# Patient Record
Sex: Male | Born: 1950 | Race: White | Marital: Married | Smoking: Never smoker
Health system: Southern US, Community
[De-identification: ages and names within clinical notes are randomized; demographics above are authoritative.]

## PROBLEM LIST (undated history)

## (undated) DIAGNOSIS — K8020 Calculus of gallbladder without cholecystitis without obstruction: Secondary | ICD-10-CM

## (undated) DIAGNOSIS — G2581 Restless legs syndrome: Secondary | ICD-10-CM

## (undated) DIAGNOSIS — I219 Acute myocardial infarction, unspecified: Secondary | ICD-10-CM

## (undated) DIAGNOSIS — I10 Essential (primary) hypertension: Secondary | ICD-10-CM

## (undated) DIAGNOSIS — E119 Type 2 diabetes mellitus without complications: Secondary | ICD-10-CM

## (undated) DIAGNOSIS — D509 Iron deficiency anemia, unspecified: Secondary | ICD-10-CM

## (undated) DIAGNOSIS — I2510 Atherosclerotic heart disease of native coronary artery without angina pectoris: Secondary | ICD-10-CM

## (undated) DIAGNOSIS — G4730 Sleep apnea, unspecified: Secondary | ICD-10-CM

## (undated) DIAGNOSIS — I471 Supraventricular tachycardia, unspecified: Secondary | ICD-10-CM

## (undated) DIAGNOSIS — K5792 Diverticulitis of intestine, part unspecified, without perforation or abscess without bleeding: Secondary | ICD-10-CM

## (undated) HISTORY — PX: CERVICAL FUSION: SHX112

## (undated) HISTORY — DX: Supraventricular tachycardia: I47.1

## (undated) HISTORY — DX: Restless legs syndrome: G25.81

## (undated) HISTORY — PX: OTHER SURGICAL HISTORY: SHX169

## (undated) HISTORY — DX: Sleep apnea, unspecified: G47.30

## (undated) HISTORY — DX: Iron deficiency anemia, unspecified: D50.9

## (undated) HISTORY — DX: Supraventricular tachycardia, unspecified: I47.10

---

## 2016-11-27 DIAGNOSIS — E782 Mixed hyperlipidemia: Secondary | ICD-10-CM | POA: Diagnosis not present

## 2016-11-27 DIAGNOSIS — I10 Essential (primary) hypertension: Secondary | ICD-10-CM | POA: Diagnosis not present

## 2016-11-27 DIAGNOSIS — I2510 Atherosclerotic heart disease of native coronary artery without angina pectoris: Secondary | ICD-10-CM | POA: Diagnosis not present

## 2016-11-27 DIAGNOSIS — E119 Type 2 diabetes mellitus without complications: Secondary | ICD-10-CM | POA: Diagnosis not present

## 2016-11-27 DIAGNOSIS — E1165 Type 2 diabetes mellitus with hyperglycemia: Secondary | ICD-10-CM | POA: Diagnosis not present

## 2016-12-17 DIAGNOSIS — R079 Chest pain, unspecified: Secondary | ICD-10-CM | POA: Diagnosis not present

## 2016-12-17 DIAGNOSIS — E782 Mixed hyperlipidemia: Secondary | ICD-10-CM | POA: Diagnosis not present

## 2016-12-17 DIAGNOSIS — I10 Essential (primary) hypertension: Secondary | ICD-10-CM | POA: Diagnosis not present

## 2016-12-17 DIAGNOSIS — E1165 Type 2 diabetes mellitus with hyperglycemia: Secondary | ICD-10-CM | POA: Diagnosis not present

## 2016-12-31 ENCOUNTER — Emergency Department (HOSPITAL_COMMUNITY): Payer: Medicare Other

## 2016-12-31 ENCOUNTER — Emergency Department (HOSPITAL_COMMUNITY)
Admission: EM | Admit: 2016-12-31 | Discharge: 2016-12-31 | Disposition: A | Discharge status: Home / Self Care | Payer: Medicare Other | Attending: Emergency Medicine | Admitting: Emergency Medicine

## 2016-12-31 ENCOUNTER — Encounter (HOSPITAL_COMMUNITY): Payer: Self-pay | Admitting: Emergency Medicine

## 2016-12-31 DIAGNOSIS — Z7984 Long term (current) use of oral hypoglycemic drugs: Secondary | ICD-10-CM | POA: Insufficient documentation

## 2016-12-31 DIAGNOSIS — E860 Dehydration: Secondary | ICD-10-CM | POA: Insufficient documentation

## 2016-12-31 DIAGNOSIS — R1084 Generalized abdominal pain: Secondary | ICD-10-CM | POA: Diagnosis not present

## 2016-12-31 DIAGNOSIS — I10 Essential (primary) hypertension: Secondary | ICD-10-CM | POA: Diagnosis not present

## 2016-12-31 DIAGNOSIS — E119 Type 2 diabetes mellitus without complications: Secondary | ICD-10-CM | POA: Insufficient documentation

## 2016-12-31 DIAGNOSIS — R1110 Vomiting, unspecified: Secondary | ICD-10-CM | POA: Diagnosis not present

## 2016-12-31 DIAGNOSIS — R112 Nausea with vomiting, unspecified: Secondary | ICD-10-CM

## 2016-12-31 DIAGNOSIS — E876 Hypokalemia: Secondary | ICD-10-CM | POA: Diagnosis not present

## 2016-12-31 DIAGNOSIS — R109 Unspecified abdominal pain: Secondary | ICD-10-CM | POA: Diagnosis not present

## 2016-12-31 HISTORY — DX: Essential (primary) hypertension: I10

## 2016-12-31 HISTORY — DX: Type 2 diabetes mellitus without complications: E11.9

## 2016-12-31 HISTORY — DX: Diverticulitis of intestine, part unspecified, without perforation or abscess without bleeding: K57.92

## 2016-12-31 LAB — URINALYSIS, ROUTINE W REFLEX MICROSCOPIC
BILIRUBIN URINE: NEGATIVE
KETONES UR: 80 mg/dL — AB
LEUKOCYTES UA: NEGATIVE
Nitrite: NEGATIVE
PH: 5 (ref 5.0–8.0)
PROTEIN: 100 mg/dL — AB
Specific Gravity, Urine: 1.025 (ref 1.005–1.030)

## 2016-12-31 LAB — COMPREHENSIVE METABOLIC PANEL
ALT: 37 U/L (ref 17–63)
AST: 26 U/L (ref 15–41)
Albumin: 4.9 g/dL (ref 3.5–5.0)
Alkaline Phosphatase: 59 U/L (ref 38–126)
Anion gap: 17 — ABNORMAL HIGH (ref 5–15)
BUN: 34 mg/dL — AB (ref 6–20)
CHLORIDE: 91 mmol/L — AB (ref 101–111)
CO2: 24 mmol/L (ref 22–32)
Calcium: 9.6 mg/dL (ref 8.9–10.3)
Creatinine, Ser: 1.08 mg/dL (ref 0.61–1.24)
Glucose, Bld: 250 mg/dL — ABNORMAL HIGH (ref 65–99)
POTASSIUM: 2.8 mmol/L — AB (ref 3.5–5.1)
SODIUM: 132 mmol/L — AB (ref 135–145)
Total Bilirubin: 1.3 mg/dL — ABNORMAL HIGH (ref 0.3–1.2)
Total Protein: 8.3 g/dL — ABNORMAL HIGH (ref 6.5–8.1)

## 2016-12-31 LAB — CBC WITH DIFFERENTIAL/PLATELET
BASOS ABS: 0 10*3/uL (ref 0.0–0.1)
Basophils Relative: 0 %
EOS ABS: 0 10*3/uL (ref 0.0–0.7)
EOS PCT: 0 %
HCT: 51.4 % (ref 39.0–52.0)
Hemoglobin: 18.5 g/dL — ABNORMAL HIGH (ref 13.0–17.0)
LYMPHS PCT: 9 %
Lymphs Abs: 1.2 10*3/uL (ref 0.7–4.0)
MCH: 29.6 pg (ref 26.0–34.0)
MCHC: 36 g/dL (ref 30.0–36.0)
MCV: 82.4 fL (ref 78.0–100.0)
MONO ABS: 0.8 10*3/uL (ref 0.1–1.0)
Monocytes Relative: 6 %
Neutro Abs: 11.5 10*3/uL — ABNORMAL HIGH (ref 1.7–7.7)
Neutrophils Relative %: 85 %
PLATELETS: 247 10*3/uL (ref 150–400)
RBC: 6.24 MIL/uL — AB (ref 4.22–5.81)
RDW: 13.5 % (ref 11.5–15.5)
WBC: 13.5 10*3/uL — AB (ref 4.0–10.5)

## 2016-12-31 LAB — LIPASE, BLOOD: LIPASE: 25 U/L (ref 11–51)

## 2016-12-31 MED ORDER — MORPHINE SULFATE (PF) 4 MG/ML IV SOLN
4.0000 mg | Freq: Once | INTRAVENOUS | Status: AC
  Administered 2016-12-31: 4 mg via INTRAVENOUS
  Filled 2016-12-31: qty 1

## 2016-12-31 MED ORDER — ONDANSETRON HCL 4 MG PO TABS: 4.0000 mg | ORAL_TABLET | Freq: Four times a day (QID) | ORAL | 0 refills | Status: DC | PRN

## 2016-12-31 MED ORDER — ONDANSETRON HCL 4 MG/2ML IJ SOLN
4.0000 mg | Freq: Once | INTRAMUSCULAR | Status: AC
  Administered 2016-12-31: 4 mg via INTRAVENOUS
  Filled 2016-12-31: qty 2

## 2016-12-31 MED ORDER — POTASSIUM CHLORIDE CRYS ER 20 MEQ PO TBCR: 40.0000 meq | EXTENDED_RELEASE_TABLET | Freq: Every day | ORAL | 0 refills | Status: DC

## 2016-12-31 MED ORDER — SODIUM CHLORIDE 0.9 % IV BOLUS (SEPSIS)
1000.0000 mL | Freq: Once | INTRAVENOUS | Status: AC
  Administered 2016-12-31: 1000 mL via INTRAVENOUS

## 2016-12-31 MED ORDER — HYDROCODONE-ACETAMINOPHEN 5-325 MG PO TABS: 1.0000 | ORAL_TABLET | ORAL | 0 refills | Status: DC | PRN

## 2016-12-31 MED ORDER — ONDANSETRON HCL 4 MG/2ML IJ SOLN
INTRAMUSCULAR | Status: AC
  Filled 2016-12-31: qty 2

## 2016-12-31 MED ORDER — POTASSIUM CHLORIDE 10 MEQ/100ML IV SOLN
10.0000 meq | INTRAVENOUS | Status: AC
Start: 2016-12-31 — End: 2016-12-31
  Administered 2016-12-31 (×2): 10 meq via INTRAVENOUS
  Filled 2016-12-31 (×2): qty 100

## 2016-12-31 MED ORDER — ONDANSETRON HCL 4 MG/2ML IJ SOLN
4.0000 mg | Freq: Once | INTRAMUSCULAR | Status: AC
  Administered 2016-12-31: 4 mg via INTRAVENOUS

## 2016-12-31 MED ORDER — IOPAMIDOL (ISOVUE-300) INJECTION 61%
100.0000 mL | Freq: Once | INTRAVENOUS | Status: AC | PRN
  Administered 2016-12-31: 100 mL via INTRAVENOUS

## 2016-12-31 NOTE — ED Triage Notes (Signed)
Pt reports severe mid abdominal pain with nausea vomiting x 3 days.

## 2016-12-31 NOTE — ED Provider Notes (Signed)
[facility] DEPT Provider Note   CSN: [id number] Arrival date & time: 12/31/16  1502     History   Chief Complaint Chief Complaint  Patient presents with  . Abdominal Pain    HPI Russell Morrow is a 66 y.o. male.  HPI Patient presents with several days of abdominal pain. States the pain is been migratory. Associated with nausea and vomiting. States he had a bowel movement after taking a Dulcolax suppository. No blood in the stool. No fever or chills. Abdominal pain worsened today and is now mostly in his left lower quadrant. Denies flank pain, hematuria, frequency or dysuria. Has history of diverticulitis. States he did not take his blood pressure medications this morning. Past Medical History:  Diagnosis Date  . Diabetes mellitus without complication (HCC)   . Diverticulitis   . Hypertension   . Myocardial infarct (HCC)     There are no active problems to display for this patient.   Past Surgical History:  Procedure Laterality Date  . CERVICAL FUSION    . nasal septum repair         Home Medications    Prior to Admission medications   Medication Sig Start Date End Date Taking? Authorizing Provider  acetaminophen (TYLENOL) 325 MG tablet Take 650 mg by mouth every 6 (six) hours as needed for mild pain or moderate pain.   Yes [provider]  clonazePAM (KLONOPIN) 2 MG tablet Take 2 mg by mouth at bedtime.   Yes [provider]  cloNIDine (CATAPRES) 0.2 MG tablet Take 0.2 mg by mouth 2 (two) times daily.   Yes [provider]  diphenhydrAMINE (BENADRYL) 25 mg capsule Take 25 mg by mouth at bedtime.   Yes [provider]  esomeprazole (NEXIUM) 40 MG capsule Take 40 mg by mouth daily.   Yes [provider]  hydrochlorothiazide (HYDRODIURIL) 25 MG tablet Take 25 mg by mouth daily.   Yes [provider]  insulin glargine (LANTUS) 100 UNIT/ML injection Inject 25 Units into the skin at bedtime.   Yes [provider]  irbesartan (AVAPRO) 300 MG tablet Take 300 mg by mouth daily.   Yes [provider]  Liraglutide (VICTOZA [place]) Inject 1.2 mLs into the skin at bedtime.   Yes [provider]  metFORMIN (GLUCOPHAGE) 1000 MG tablet Take 1,000 mg by mouth 2 (two) times daily with a meal.   Yes [provider]  HYDROcodone-acetaminophen (NORCO) 5-325 MG tablet Take 1-2 tablets by mouth every 4 (four) hours as needed for severe pain. 12/31/16   Russell Racer, MD  ondansetron (ZOFRAN) 4 MG tablet Take 1 tablet (4 mg total) by mouth every 6 (six) hours as needed for nausea or vomiting. 12/31/16   Russell Racer, MD  potassium chloride SA (K-DUR,KLOR-CON) 20 MEQ tablet Take 2 tablets (40 mEq total) by mouth daily. 12/31/16   Russell Racer, MD    Family History History reviewed. No pertinent family history.  Social History Social History  Substance Use Topics  . Smoking status: Never Smoker  . Smokeless tobacco: Never Used  . Alcohol use 1.2 oz/week    2 Shots of liquor per week     Comment: evenings     Allergies   Statins   Review of Systems Review of Systems  Constitutional: Negative for chills, fatigue and fever.  Respiratory: Negative for cough and shortness of breath.   Cardiovascular: Negative for chest pain.  Gastrointestinal: Positive for abdominal pain, constipation, nausea and vomiting. Negative  for blood in stool and diarrhea.  Genitourinary: Negative for dysuria, flank pain, frequency and hematuria.  Musculoskeletal: Negative for back pain, myalgias, neck pain and neck stiffness.  Skin: Negative for rash and wound.  Neurological: Negative for dizziness, weakness, light-headedness, numbness and headaches.  All other systems reviewed and are negative.    Physical Exam Updated Vital Signs BP (!) 176/77   Pulse 94   Temp 98.8 F (37.1 C) (Oral)   Resp 14   Ht 6\' 2"  (1.88 m)   Wt 106.6 kg (235 lb)   SpO2 96%   BMI 30.17 kg/m   Physical Exam    Constitutional: He is oriented to person, place, and time. He appears well-developed and well-nourished. No distress.  HENT:  Head: Normocephalic and atraumatic.  Mouth/Throat: Oropharynx is clear and moist. No oropharyngeal exudate.  Eyes: Pupils are equal, round, and reactive to light. EOM are normal.  Neck: Normal range of motion. Neck supple.  Cardiovascular: Regular rhythm.  Exam reveals no gallop and no friction rub.   No murmur heard. Tachycardia  Pulmonary/Chest: Effort normal and breath sounds normal. No respiratory distress. He has no wheezes. He has no rales. He exhibits no tenderness.  Abdominal: Soft. Bowel sounds are normal. There is tenderness. There is no rebound and no guarding.  Mild left lower quadrant tenderness to palpation without rebound or guarding.  Musculoskeletal: Normal range of motion. He exhibits no edema or tenderness.  No CVA tenderness. Distal pulses are 2+.  Neurological: He is alert and oriented to person, place, and time.  Moves all extremities without focal deficit. Sensation fully intact.  Skin: Skin is warm and dry. Capillary refill takes less than 2 seconds. No rash noted. No erythema.  Psychiatric: He has a normal mood and affect. His behavior is normal.  Nursing note and vitals reviewed.    ED Treatments / Results  Labs (all labs ordered are listed, but only abnormal results are displayed) Labs Reviewed  CBC WITH DIFFERENTIAL/PLATELET - Abnormal; Notable for the following:       Result Value   WBC 13.5 (*)    RBC 6.24 (*)    Hemoglobin 18.5 (*)    Neutro Abs 11.5 (*)    All other components within normal limits  COMPREHENSIVE METABOLIC PANEL - Abnormal; Notable for the following:    Sodium 132 (*)    Potassium 2.8 (*)    Chloride 91 (*)    Glucose, Bld 250 (*)    BUN 34 (*)    Total Protein 8.3 (*)    Total Bilirubin 1.3 (*)    Anion gap 17 (*)    All other components within normal limits  URINALYSIS, ROUTINE W REFLEX MICROSCOPIC  - Abnormal; Notable for the following:    Glucose, UA >=500 (*)    Hgb urine dipstick MODERATE (*)    Ketones, ur 80 (*)    Protein, ur 100 (*)    Bacteria, UA RARE (*)    Squamous Epithelial / LPF 0-5 (*)    All other components within normal limits  LIPASE, BLOOD    EKG  EKG Interpretation  Date/Time:  Wednesday December 31 2016 15:50:01 EDT Ventricular Rate:  108 PR Interval:    QRS Duration: 84 QT Interval:  320 QTC Calculation: 429 R Axis:   -102 Text Interpretation:  Sinus tachycardia LAE, consider biatrial enlargement LAD, consider left anterior fascicular block Right ventricular hypertrophy No old tracing to compare Confirmed by Linwood DibblesKnapp, Jon [phone]) on 01/01/2017 2:33:25  PM       Radiology Ct Abdomen Pelvis W Contrast  Result Date: 12/31/2016 CLINICAL DATA:  Severe mid abdominal pain with nausea and vomiting for 3 days EXAM: CT ABDOMEN AND PELVIS WITH CONTRAST TECHNIQUE: Multidetector CT imaging of the abdomen and pelvis was performed using the standard protocol following bolus administration of intravenous contrast. CONTRAST:  100mL ISOVUE-300 IOPAMIDOL (ISOVUE-300) INJECTION 61% COMPARISON:  None. FINDINGS: Lower chest: Lung bases demonstrate linear atelectasis or scar at the right base. No pleural effusion or focal consolidation is seen. The heart is nonenlarged. Minimal coronary artery calcification. Hepatobiliary: Hepatic steatosis. Small calcified gallstone. No biliary dilatation Pancreas: Unremarkable. No pancreatic ductal dilatation or surrounding inflammatory changes. Spleen: Normal in size without focal abnormality. Adrenals/Urinary Tract: Adrenal glands are within normal limits. Nonspecific perinephric fat stranding. No hydronephrosis. 13 mm cystic density in the mid to lower right kidney with possible increased peripheral attenuation. Normal bladder Stomach/Bowel: Stomach is within normal limits. Appendix appears normal. No evidence of bowel wall thickening, distention, or  inflammatory changes. Mild sigmoid colon diverticular disease without acute inflammation Vascular/Lymphatic: Aortic atherosclerosis. No enlarged abdominal or pelvic lymph nodes. Reproductive: Prostate is unremarkable. Other: Small in the left inguinal canal.  No free air or free fluid. Musculoskeletal: Degenerative changes. No acute or suspicious bone lesion. IMPRESSION: 1. Negative for acute intra-abdominal or pelvic pathology 2. Gallstone 3. 13 mm cystic lesion mid to lower right kidney with possible increased peripheral density or peripheral enhancement. Suggest nonemergent follow-up renal CT or MRI. 4. Sigmoid colon diverticular disease without acute inflammation Electronically Signed   By: Jasmine PangKim  Fujinaga M.D.   On: 12/31/2016 17:25    Procedures Procedures (including critical care time)  Medications Ordered in ED Medications  sodium chloride 0.9 % bolus 1,000 mL (0 mLs Intravenous Stopped 12/31/16 1817)  morphine 4 MG/ML injection 4 mg (4 mg Intravenous Given 12/31/16 1555)  ondansetron (ZOFRAN) injection 4 mg (4 mg Intravenous Given 12/31/16 1555)  potassium chloride 10 mEq in 100 mL IVPB (0 mEq Intravenous Stopped 12/31/16 1839)  iopamidol (ISOVUE-300) 61 % injection 100 mL (100 mLs Intravenous Contrast Given 12/31/16 1711)  morphine 4 MG/ML injection 4 mg (4 mg Intravenous Given 12/31/16 1729)  sodium chloride 0.9 % bolus 1,000 mL (0 mLs Intravenous Stopped 12/31/16 1945)  ondansetron (ZOFRAN) injection 4 mg (4 mg Intravenous Given 12/31/16 1846)     Initial Impression / Assessment and Plan / ED Course  I have reviewed the triage vital signs and the nursing notes.  Pertinent labs & imaging results that were available during my care of the patient were reviewed by me and considered in my medical decision making (see chart for details).     No intra-abdominal findings on CT. Given potassium replacement and IV fluids in the emergency department. Patient states he is feeling much better.  Abdominal exam is benign. He's been given return precautions and has voiced understanding.  Final Clinical Impressions(s) / ED Diagnoses   Final diagnoses:  Generalized abdominal pain  Non-intractable vomiting with nausea, unspecified vomiting type  Dehydration  Hypokalemia    New Prescriptions Discharge Medication List as of 12/31/2016  7:57 PM    START taking these medications   Details  HYDROcodone-acetaminophen (NORCO) 5-325 MG tablet Take 1-2 tablets by mouth every 4 (four) hours as needed for severe pain., Starting Wed 12/31/2016, Print    ondansetron (ZOFRAN) 4 MG tablet Take 1 tablet (4 mg total) by mouth every 6 (six) hours as needed for nausea or vomiting.,  Starting Wed 12/31/2016, Print         Russell Racer, MD 01/01/17 [phone]

## 2017-01-09 NOTE — Progress Notes (Signed)
Cardiology Office Note   Date:  01/13/2017   ID:  Russell Morrow, DOB 11-12-1950, MRN [id number]  PCP:  Celene Squibb, MD  Cardiologist:   Jenkins Rouge, MD   No chief complaint on file.     History of Present Illness: Russell Morrow is a 66 y.o. male who presents for consultation regarding chest pain Referred by Dr Nevada Crane CRFls include DM And HTN.  Despite DM currently not on statin History of elevated LFTls  ? Fatty liver due to ETOH Indicates cath in 2010 with "mild" CAD and mildly dilated aortic root. Family history positive for CAD Brother has a stent.   Labs 06/12/16 LDL-P 1210  HDL 35 LDL -C 121  A1c 9.5   Echo 11/22/15 : done [place] Normal EF moderate LVH 16 mm  grade one diastolic Mild LAE MAC Aorta 3.9 cm  Myovue :  11/22/15 Moderate fixed inferior wall defect EF 45% no ischemic ECG changes Ex 6:39 minutes  CT: calcium score 81 46th percentile  nonobstructive disease in LAD/Circumflex  Aorta 4.0 cm   Seen in ER 12/31/16 for abdominal pain and low K Hydrated CT no pathology Discharged Did mention 13 mm cystic lesion in Right lower Kidney needs f/u CT/MRI  He is a retired Insurance account manager. Got divorced a year ago and moved to [place] to be near grand kids But now back here with girlfriend helping to care for her mother. He has not taken good care of himself the Last year and BS poorly controlled Still walks/hikes a lot with no chest pain. Chronic PACls with occasional palpitations  Has wine most nights and smokes mariajuana daily   Past Medical History:  Diagnosis Date  . Diabetes mellitus without complication ([place])   . Diverticulitis   . Hypertension   . Myocardial infarct [facility])     Past Surgical History:  Procedure Laterality Date  . CERVICAL FUSION    . nasal septum repair       Current Outpatient Prescriptions  Medication Sig Dispense Refill  . acetaminophen (TYLENOL) 325 MG tablet Take 650 mg by mouth every 6 (six) hours as needed for mild pain or moderate pain.     Marland Kitchen amLODipine (NORVASC) 5 MG tablet Take 5 mg by mouth daily.    . clonazePAM (KLONOPIN) 2 MG tablet Take 2 mg by mouth at bedtime.    . cloNIDine (CATAPRES) 0.2 MG tablet Take 0.2 mg by mouth 2 (two) times daily.    . diphenhydrAMINE (BENADRYL) 25 mg capsule Take 25 mg by mouth at bedtime.    Marland Kitchen esomeprazole (NEXIUM) 40 MG capsule Take 40 mg by mouth daily.    . hydrochlorothiazide (HYDRODIURIL) 25 MG tablet Take 25 mg by mouth daily.    Marland Kitchen HYDROcodone-acetaminophen (NORCO) 5-325 MG tablet Take 1-2 tablets by mouth every 4 (four) hours as needed for severe pain. 10 tablet 0  . insulin glargine (LANTUS) 100 UNIT/ML injection Inject 25 Units into the skin at bedtime.    . irbesartan (AVAPRO) 300 MG tablet Take 300 mg by mouth daily.    . Liraglutide (VICTOZA [place]) Inject 1.2 mLs into the skin at bedtime.    . metFORMIN (GLUCOPHAGE) 1000 MG tablet Take 1,000 mg by mouth 2 (two) times daily with a meal.    . ondansetron (ZOFRAN) 4 MG tablet Take 1 tablet (4 mg total) by mouth every 6 (six) hours as needed for nausea or vomiting. 12 tablet 0  . potassium chloride SA (  K-DUR,KLOR-CON) 20 MEQ tablet Take 2 tablets (40 mEq total) by mouth daily. 5 tablet 0  . ezetimibe (ZETIA) 10 MG tablet Take 1 tablet (10 mg total) by mouth daily. 30 tablet 6   No current facility-administered medications for this visit.     Allergies:   Statins    Social History:  The patient  reports that he has never smoked. He has never used smokeless tobacco. He reports that he drinks about 1.2 oz of alcohol per week . He reports that he uses drugs, including Marijuana.   Family History:  The patient's family history includes Cancer in his mother; Diabetes in his brother and father; Heart disease in his brother and father; Hypertension in his brother, father, and mother.    ROS:  Please see the history of present illness.   Otherwise, review of systems are positive for none.   All other systems are reviewed and negative.     PHYSICAL EXAM: VS:  BP (!) 184/86 (BP Location: Right Arm, Cuff Size: Large)   Pulse 84   Ht _0  (1.88 m)   Wt 240 lb 6.4 oz (109 kg)   SpO2 97%   BMI 30.87 kg/m  , BMI Body mass index is 30.87 kg/m. Affect appropriate Healthy:  appears stated age 19: normal Neck supple with no adenopathy JVP normal no bruits no thyromegaly Lungs clear with no wheezing and good diaphragmatic motion Heart:  S1/S2 no murmur, no rub, gallop or click PMI normal Abdomen: benighn, BS positve, no tenderness, no AAA no bruit.  No HSM or HJR Distal pulses intact with no bruits No edema Neuro non-focal Skin warm and dry No muscular weakness    EKG:  01/01/17 SR [id number] no previous MI 01/10/16 NSR rate 72 PAC nonspecific lateral T wave changes    Recent Labs: 12/31/2016: ALT 37; BUN 34; Creatinine, Ser 1.08; Hemoglobin 18.5; Platelets 247; Potassium 2.8; Sodium 132    Lipid Panel No results found for: CHOL, TRIG, HDL, CHOLHDL, VLDL, LDLCALC, LDLDIRECT    Wt Readings from Last 3 Encounters:  01/13/17 240 lb 6.4 oz (109 kg)  12/31/16 235 lb (106.6 kg)      Other studies Reviewed: Additional studies/ records that were reviewed today include: Notes primary [facility] Notes from [facility] including Primary care notes cardiac CT, echo and myovue done June 2017 .    ASSESSMENT AND PLAN:  1.  CAD non obstructive by CT 11/2015 false positive myovue continue risk factor modification  2. DM needs to see eye doctor f/u primary poor control  3. Cholesterol intolerant to multiple statins with elevated LFTls start zetia 10 mg labs in 3 months  4. Aortic Root Dilatation mild 3.9-4.0 cm no need for f/u at this time  5. HTN: says he's compliant f/u primary consider changing norvasc to beta blocker he has been on them in  Past and doesn't like them but has chronic PAC;s and tends to be tachycardiac with his DM.    Current medicines are reviewed at length with the patient today.  The patient does not  have concerns regarding medicines.  The following changes have been made:  Zetia 10 mg   Labs/ tests ordered today include: Lipid / Liver 3 months   Orders Placed This Encounter  Procedures  . Hepatic function panel  . Lipid Profile  . EKG 12-Lead     Disposition:   FU with me in 6 months      Signed, Jenkins Rouge, MD  01/13/2017 10:44 AM    [facility] [place], [place], [place]  [address] Phone: [phone]; Fax: [phone]

## 2017-01-12 ENCOUNTER — Encounter: Payer: Self-pay | Admitting: *Deleted

## 2017-01-13 ENCOUNTER — Encounter: Payer: Self-pay | Admitting: Cardiovascular Disease

## 2017-01-13 ENCOUNTER — Ambulatory Visit (INDEPENDENT_AMBULATORY_CARE_PROVIDER_SITE_OTHER): Payer: Medicare Other | Admitting: Cardiovascular Disease

## 2017-01-13 VITALS — BP 184/86 | HR 84 | Ht 74.0 in | Wt 240.4 lb

## 2017-01-13 DIAGNOSIS — E784 Other hyperlipidemia: Secondary | ICD-10-CM | POA: Diagnosis not present

## 2017-01-13 DIAGNOSIS — I10 Essential (primary) hypertension: Secondary | ICD-10-CM | POA: Diagnosis not present

## 2017-01-13 DIAGNOSIS — E7849 Other hyperlipidemia: Secondary | ICD-10-CM

## 2017-01-13 MED ORDER — EZETIMIBE 10 MG PO TABS: 10.0000 mg | ORAL_TABLET | Freq: Every day | ORAL | 6 refills | Status: DC

## 2017-01-13 NOTE — Patient Instructions (Signed)
Medication Instructions:  Your physician has recommended you make the following change in your medication:  Start Zetia 10 mg Daily    Labwork: Your physician recommends that you return for lab work in: 3 Months ( November 7)    Testing/Procedures: NONE  Follow-Up: Your physician wants you to follow-up in: 6 Months with Dr. Eden EmmsNishan.  You will receive a reminder letter in the mail two months in advance. If you don't receive a letter, please call our office to schedule the follow-up appointment.   Any Other Special Instructions Will Be Listed Below (If Applicable).     If you need a refill on your cardiac medications before your next appointment, please call your pharmacy.

## 2017-02-16 ENCOUNTER — Encounter (HOSPITAL_COMMUNITY): Payer: Self-pay | Admitting: *Deleted

## 2017-02-16 ENCOUNTER — Emergency Department (HOSPITAL_COMMUNITY)
Admission: EM | Admit: 2017-02-16 | Discharge: 2017-02-16 | Disposition: A | Discharge status: Home / Self Care | Payer: Medicare Other | Attending: Emergency Medicine | Admitting: Emergency Medicine

## 2017-02-16 ENCOUNTER — Emergency Department (HOSPITAL_COMMUNITY): Payer: Medicare Other

## 2017-02-16 DIAGNOSIS — I10 Essential (primary) hypertension: Secondary | ICD-10-CM | POA: Diagnosis not present

## 2017-02-16 DIAGNOSIS — I252 Old myocardial infarction: Secondary | ICD-10-CM | POA: Diagnosis not present

## 2017-02-16 DIAGNOSIS — Z794 Long term (current) use of insulin: Secondary | ICD-10-CM | POA: Insufficient documentation

## 2017-02-16 DIAGNOSIS — R1030 Lower abdominal pain, unspecified: Secondary | ICD-10-CM | POA: Diagnosis not present

## 2017-02-16 DIAGNOSIS — R109 Unspecified abdominal pain: Secondary | ICD-10-CM

## 2017-02-16 DIAGNOSIS — Z79899 Other long term (current) drug therapy: Secondary | ICD-10-CM | POA: Insufficient documentation

## 2017-02-16 DIAGNOSIS — E119 Type 2 diabetes mellitus without complications: Secondary | ICD-10-CM | POA: Diagnosis not present

## 2017-02-16 LAB — BASIC METABOLIC PANEL
Anion gap: 10 (ref 5–15)
BUN: 16 mg/dL (ref 6–20)
CALCIUM: 8.7 mg/dL — AB (ref 8.9–10.3)
CO2: 30 mmol/L (ref 22–32)
CREATININE: 0.7 mg/dL (ref 0.61–1.24)
Chloride: 98 mmol/L — ABNORMAL LOW (ref 101–111)
GFR calc non Af Amer: >60 mL/min (ref >60)
Glucose, Bld: 179 mg/dL — ABNORMAL HIGH (ref 65–99)
Potassium: 3.4 mmol/L — ABNORMAL LOW (ref 3.5–5.1)
SODIUM: 138 mmol/L (ref 135–145)

## 2017-02-16 LAB — LACTIC ACID, PLASMA
LACTIC ACID, VENOUS: 3.6 mmol/L — AB (ref 0.5–1.9)
Lactic Acid, Venous: 2.5 mmol/L (ref 0.5–1.9)

## 2017-02-16 LAB — COMPREHENSIVE METABOLIC PANEL
ALK PHOS: 54 U/L (ref 38–126)
ALT: 44 U/L (ref 17–63)
AST: 28 U/L (ref 15–41)
Albumin: 4.7 g/dL (ref 3.5–5.0)
Anion gap: 20 — ABNORMAL HIGH (ref 5–15)
BUN: 19 mg/dL (ref 6–20)
CALCIUM: 9.1 mg/dL (ref 8.9–10.3)
CO2: 22 mmol/L (ref 22–32)
CREATININE: 0.77 mg/dL (ref 0.61–1.24)
Chloride: 95 mmol/L — ABNORMAL LOW (ref 101–111)
Glucose, Bld: 242 mg/dL — ABNORMAL HIGH (ref 65–99)
Potassium: 3 mmol/L — ABNORMAL LOW (ref 3.5–5.1)
Sodium: 137 mmol/L (ref 135–145)
TOTAL PROTEIN: 7.7 g/dL (ref 6.5–8.1)
Total Bilirubin: 1.2 mg/dL (ref 0.3–1.2)

## 2017-02-16 LAB — CBC WITH DIFFERENTIAL/PLATELET
BASOS ABS: 0 10*3/uL (ref 0.0–0.1)
BASOS PCT: 0 %
EOS ABS: 0 10*3/uL (ref 0.0–0.7)
EOS PCT: 0 %
HCT: 46.1 % (ref 39.0–52.0)
Hemoglobin: 16.6 g/dL (ref 13.0–17.0)
Lymphocytes Relative: 8 %
Lymphs Abs: 0.7 10*3/uL (ref 0.7–4.0)
MCH: 30 pg (ref 26.0–34.0)
MCHC: 36 g/dL (ref 30.0–36.0)
MCV: 83.2 fL (ref 78.0–100.0)
Monocytes Absolute: 0.4 10*3/uL (ref 0.1–1.0)
Monocytes Relative: 5 %
Neutro Abs: 7.2 10*3/uL (ref 1.7–7.7)
Neutrophils Relative %: 87 %
PLATELETS: 215 10*3/uL (ref 150–400)
RBC: 5.54 MIL/uL (ref 4.22–5.81)
RDW: 13.6 % (ref 11.5–15.5)
WBC: 8.4 10*3/uL (ref 4.0–10.5)

## 2017-02-16 LAB — URINALYSIS, ROUTINE W REFLEX MICROSCOPIC
BILIRUBIN URINE: NEGATIVE
Bacteria, UA: NONE SEEN
Ketones, ur: 80 mg/dL — AB
Leukocytes, UA: NEGATIVE
NITRITE: NEGATIVE
Protein, ur: 100 mg/dL — AB
SPECIFIC GRAVITY, URINE: 1.025 (ref 1.005–1.030)
Squamous Epithelial / LPF: NONE SEEN
pH: 5 (ref 5.0–8.0)

## 2017-02-16 LAB — LIPASE, BLOOD: LIPASE: 30 U/L (ref 11–51)

## 2017-02-16 MED ORDER — IOPAMIDOL (ISOVUE-300) INJECTION 61%
100.0000 mL | Freq: Once | INTRAVENOUS | Status: AC | PRN
  Administered 2017-02-16: 100 mL via INTRAVENOUS

## 2017-02-16 MED ORDER — ONDANSETRON 4 MG PO TBDP: ORAL_TABLET | ORAL | 0 refills | Status: DC

## 2017-02-16 MED ORDER — HYDROCODONE-ACETAMINOPHEN 5-325 MG PO TABS: 1.0000 | ORAL_TABLET | Freq: Four times a day (QID) | ORAL | 0 refills | Status: DC | PRN

## 2017-02-16 MED ORDER — CLONIDINE HCL 0.2 MG PO TABS
0.2000 mg | ORAL_TABLET | Freq: Once | ORAL | Status: AC
  Administered 2017-02-16: 0.2 mg via ORAL
  Filled 2017-02-16: qty 1

## 2017-02-16 MED ORDER — MORPHINE SULFATE (PF) 4 MG/ML IV SOLN
4.0000 mg | INTRAVENOUS | Status: DC | PRN
  Administered 2017-02-16: 4 mg via INTRAVENOUS
  Filled 2017-02-16: qty 1

## 2017-02-16 MED ORDER — MORPHINE SULFATE (PF) 4 MG/ML IV SOLN
4.0000 mg | Freq: Once | INTRAVENOUS | Status: DC
  Filled 2017-02-16: qty 1

## 2017-02-16 MED ORDER — MORPHINE SULFATE (PF) 4 MG/ML IV SOLN
4.0000 mg | INTRAVENOUS | Status: AC | PRN
  Administered 2017-02-16 (×2): 4 mg via INTRAVENOUS
  Filled 2017-02-16: qty 1

## 2017-02-16 MED ORDER — ONDANSETRON HCL 4 MG/2ML IJ SOLN
4.0000 mg | INTRAMUSCULAR | Status: DC | PRN
  Administered 2017-02-16: 4 mg via INTRAVENOUS
  Filled 2017-02-16: qty 2

## 2017-02-16 MED ORDER — CIPROFLOXACIN HCL 500 MG PO TABS: 500.0000 mg | ORAL_TABLET | Freq: Two times a day (BID) | ORAL | 0 refills | Status: DC

## 2017-02-16 MED ORDER — SODIUM CHLORIDE 0.9 % IV BOLUS (SEPSIS)
1000.0000 mL | Freq: Once | INTRAVENOUS | Status: AC
  Administered 2017-02-16: 1000 mL via INTRAVENOUS

## 2017-02-16 MED ORDER — POTASSIUM CHLORIDE 10 MEQ/100ML IV SOLN
10.0000 meq | Freq: Once | INTRAVENOUS | Status: AC
  Administered 2017-02-16: 10 meq via INTRAVENOUS
  Filled 2017-02-16: qty 100

## 2017-02-16 NOTE — ED Provider Notes (Addendum)
[facility] DEPT Provider Note   CSN: [id number] Arrival date & time: 02/16/17  1254     History   Chief Complaint No chief complaint on file.   HPI RIO KIDANE is a 66 y.o. male.  HPI  Pt was seen at 1325.  Per pt, c/o gradual onset and persistence of constant lower abd "pain" since overnight last night.  Has been associated with multiple intermittent episodes of N/V.  Describes the abd pain as "pressure." Pt states he "thinks it's my diverticulitis again."  Pt did not take his usual meds this morning due to N/V.  Pt was evaluated in the ED 2 months ago for similar symptoms with reassuring workup. Denies diarrhea, no fevers, no back pain, no rash, no CP/SOB, no black or blood in stools or emesis.      Past Medical History:  Diagnosis Date  . Diabetes mellitus without complication (HCC)   . Diverticulitis   . Hypertension   . Myocardial infarct [facility])     Patient Active Problem List   Diagnosis Date Noted  . Hypertension     Past Surgical History:  Procedure Laterality Date  . CERVICAL FUSION    . nasal septum repair         Home Medications    Prior to Admission medications   Medication Sig Start Date End Date Taking? Authorizing Provider  acetaminophen (TYLENOL) 325 MG tablet Take 650 mg by mouth every 6 (six) hours as needed for mild pain or moderate pain.    [provider]  amLODipine (NORVASC) 5 MG tablet Take 5 mg by mouth daily.    [provider]  clonazePAM (KLONOPIN) 2 MG tablet Take 2 mg by mouth at bedtime.    [provider]  cloNIDine (CATAPRES) 0.2 MG tablet Take 0.2 mg by mouth 2 (two) times daily.    [provider]  diphenhydrAMINE (BENADRYL) 25 mg capsule Take 25 mg by mouth at bedtime.    [provider]  esomeprazole (NEXIUM) 40 MG capsule Take 40 mg by mouth daily.    [provider]  ezetimibe (ZETIA) 10 MG tablet Take 1 tablet (10 mg total) by mouth daily. 01/13/17   Wendall Stade, MD    hydrochlorothiazide (HYDRODIURIL) 25 MG tablet Take 25 mg by mouth daily.    [provider]  HYDROcodone-acetaminophen (NORCO) 5-325 MG tablet Take 1-2 tablets by mouth every 4 (four) hours as needed for severe pain. 12/31/16   [name], MD  insulin glargine (LANTUS) 100 UNIT/ML injection Inject 25 Units into the skin at bedtime.    [provider]  irbesartan (AVAPRO) 300 MG tablet Take 300 mg by mouth daily.    [provider]  Liraglutide (VICTOZA [place]) Inject 1.2 mLs into the skin at bedtime.    [provider]  metFORMIN (GLUCOPHAGE) 1000 MG tablet Take 1,000 mg by mouth 2 (two) times daily with a meal.    [provider]  ondansetron (ZOFRAN) 4 MG tablet Take 1 tablet (4 mg total) by mouth every 6 (six) hours as needed for nausea or vomiting. 12/31/16   [name], MD  potassium chloride SA (K-DUR,KLOR-CON) 20 MEQ tablet Take 2 tablets (40 mEq total) by mouth daily. 12/31/16   [name], MD    Family History Family History  Problem Relation Age of Onset  . Cancer Mother   . Hypertension Mother   . Diabetes Father   . Heart disease Father   . Hypertension Father   .  Diabetes Brother   . Heart disease Brother   . Hypertension Brother     Social History Social History  Substance Use Topics  . Smoking status: Never Smoker  . Smokeless tobacco: Never Used  . Alcohol use 1.2 oz/week    2 Shots of liquor per week     Comment: evenings     Allergies   Statins   Review of Systems Review of Systems ROS: Statement: All systems negative except as marked or noted in the HPI; Constitutional: Negative for fever and chills. ; ; Eyes: Negative for eye pain, redness and discharge. ; ; ENMT: Negative for ear pain, hoarseness, nasal congestion, sinus pressure and sore throat. ; ; Cardiovascular: Negative for chest pain, palpitations, diaphoresis, dyspnea and peripheral edema. ; ; Respiratory: Negative for cough, wheezing and  stridor. ; ; Gastrointestinal: +N/V, abd pain. Negative for diarrhea, blood in stool, hematemesis, jaundice and rectal bleeding. . ; ; Genitourinary: Negative for dysuria, flank pain and hematuria. ; ; Genital:  No penile drainage or rash, no testicular pain or swelling, no scrotal rash or swelling. ;; Musculoskeletal: Negative for back pain and neck pain. Negative for swelling and trauma.; ; Skin: Negative for pruritus, rash, abrasions, blisters, bruising and skin lesion.; ; Neuro: Negative for headache, lightheadedness and neck stiffness. Negative for weakness, altered level of consciousness, altered mental status, extremity weakness, paresthesias, involuntary movement, seizure and syncope.       Physical Exam Updated Vital Signs BP (!) 204/112 (BP Location: Left Arm)   Pulse (!) 159   Temp 97.9 F (36.6 C) (Oral)   Resp 20   Ht 6\' 2"  (1.88 m)   Wt 104.3 kg (230 lb)   SpO2 98%   BMI 29.53 kg/m   BP 126/60   Pulse 88   Temp 97.9 F (36.6 C) (Oral)   Resp (!) 21   Ht 6\' 2"  (1.88 m)   Wt 104.3 kg (230 lb)   SpO2 93%   BMI 29.53 kg/m    Physical Exam 1330: Physical examination:  Nursing notes reviewed; Vital signs and O2 SAT reviewed;  Constitutional: Well developed, Well nourished, Well hydrated, Uncomfortable appearing;; Head:  Normocephalic, atraumatic; Eyes: EOMI, PERRL, No scleral icterus; ENMT: Mouth and pharynx normal, Mucous membranes moist; Neck: Supple, Full range of motion, No lymphadenopathy; Cardiovascular: Regular rate and rhythm, No gallop; Respiratory: Breath sounds clear & equal bilaterally, No wheezes.  Speaking full sentences with ease, Normal respiratory effort/excursion; Chest: Nontender, Movement normal; Abdomen: Soft, +diffuse tenderness to palp. No rebound or guarding. Nondistended, Normal bowel sounds; Genitourinary: No CVA tenderness; Extremities: Pulses normal, No tenderness, No edema, No calf edema or asymmetry.; Neuro: AA&Ox3, Major CN grossly intact.   Speech clear. No gross focal motor or sensory deficits in extremities.; Skin: Color normal, Warm, Dry.   ED Treatments / Results  Labs (all labs ordered are listed, but only abnormal results are displayed)   EKG  EKG Interpretation None       Radiology   Procedures Procedures (including critical care time)  Medications Ordered in ED Medications  morphine 4 MG/ML injection 4 mg (4 mg Intravenous Given 02/16/17 1345)  ondansetron (ZOFRAN) injection 4 mg (4 mg Intravenous Given 02/16/17 1345)  cloNIDine (CATAPRES) tablet 0.2 mg (0.2 mg Oral Given 02/16/17 1345)     Initial Impression / Assessment and Plan / ED Course  I have reviewed the triage vital signs and the nursing notes.  Pertinent labs & imaging results that were available during my  care of the patient were reviewed by me and considered in my medical decision making (see chart for details).  MDM Reviewed: previous chart, nursing note and vitals Reviewed previous: labs Interpretation: labs   Results for orders placed or performed during the hospital encounter of 02/16/17  Urinalysis, Routine w reflex microscopic  Result Value Ref Range   Color, Urine YELLOW YELLOW   APPearance CLEAR CLEAR   Specific Gravity, Urine 1.025 1.005 - 1.030   pH 5.0 5.0 - 8.0   Glucose, UA >=500 (A) NEGATIVE mg/dL   Hgb urine dipstick SMALL (A) NEGATIVE   Bilirubin Urine NEGATIVE NEGATIVE   Ketones, ur 80 (A) NEGATIVE mg/dL   Protein, ur 161 (A) NEGATIVE mg/dL   Nitrite NEGATIVE NEGATIVE   Leukocytes, UA NEGATIVE NEGATIVE   RBC / HPF 0-5 0 - 5 RBC/hpf   WBC, UA 0-5 0 - 5 WBC/hpf   Bacteria, UA NONE SEEN NONE SEEN   Squamous Epithelial / LPF NONE SEEN NONE SEEN  Comprehensive metabolic panel  Result Value Ref Range   Sodium 137 135 - 145 mmol/L   Potassium 3.0 (L) 3.5 - 5.1 mmol/L   Chloride 95 (L) 101 - 111 mmol/L   CO2 22 22 - 32 mmol/L   Glucose, Bld 242 (H) 65 - 99 mg/dL   BUN 19 6 - 20 mg/dL   Creatinine, Ser 0.96 0.61  - 1.24 mg/dL   Calcium 9.1 8.9 - 04.5 mg/dL   Total Protein 7.7 6.5 - 8.1 g/dL   Albumin 4.7 3.5 - 5.0 g/dL   AST 28 15 - 41 U/L   ALT 44 17 - 63 U/L   Alkaline Phosphatase 54 38 - 126 U/L   Total Bilirubin 1.2 0.3 - 1.2 mg/dL   GFR calc non Af Amer >60 >60 mL/min   GFR calc Af Amer >60 >60 mL/min   Anion gap 20 (H) 5 - 15  Lipase, blood  Result Value Ref Range   Lipase 30 11 - 51 U/L  CBC with Differential  Result Value Ref Range   WBC 8.4 4.0 - 10.5 K/uL   RBC 5.54 4.22 - 5.81 MIL/uL   Hemoglobin 16.6 13.0 - 17.0 g/dL   HCT 40.9 81.1 - 91.4 %   MCV 83.2 78.0 - 100.0 fL   MCH 30.0 26.0 - 34.0 pg   MCHC 36.0 30.0 - 36.0 g/dL   RDW 78.2 95.6 - 21.3 %   Platelets 215 150 - 400 K/uL   Neutrophils Relative % 87 %   Neutro Abs 7.2 1.7 - 7.7 K/uL   Lymphocytes Relative 8 %   Lymphs Abs 0.7 0.7 - 4.0 K/uL   Monocytes Relative 5 %   Monocytes Absolute 0.4 0.1 - 1.0 K/uL   Eosinophils Relative 0 %   Eosinophils Absolute 0.0 0.0 - 0.7 K/uL   Basophils Relative 0 %   Basophils Absolute 0.0 0.0 - 0.1 K/uL  Lactic acid, plasma  Result Value Ref Range   Lactic Acid, Venous 3.6 (HH) 0.5 - 1.9 mmol/L    1600:  Lactic acid elevated; IVF bolus given. Pt's own PO clonidine given for HTN with improvement. CT A/P pending. Dispo based on results. Sign out to Dr. Estell Harpin.    Final Clinical Impressions(s) / ED Diagnoses   Final diagnoses:  None    New Prescriptions New Prescriptions   No medications on file         Samuel Jester, DO 02/16/17 1606

## 2017-02-16 NOTE — ED Notes (Signed)
RN called lab to check on status of BMP.

## 2017-02-16 NOTE — ED Triage Notes (Signed)
Pt reports lower abdominal pain (9/10) since 4am.  Pt reports being seen in this ED 6 weeks ago for diverticulitis.  Pt reports vomiting 7-8 today. Denies diarrhea. LBM was yesterday (normal).

## 2017-02-16 NOTE — ED Provider Notes (Signed)
Patient improved with IV fluids and nausea medicine Patient had positive gallstones on CT scan but no diverticulitis. Patient will be placed on Cipro for possible diverticulitis seen on CT and also to cover his gallbladder. He was also given pain medicine and nausea medicine. He will follow-up with his PCP this week   Bethann BerkshireZammit, Enio Hornback, MD 02/16/17 [phone]

## 2017-02-16 NOTE — ED Notes (Signed)
Patient wife out to desk inquiring about wait, discussed with pt and spouse we are waiting on blood results at this time. 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 Apologized again for delay and discussed again what we are waiting on a this time.

## 2017-02-16 NOTE — Discharge Instructions (Signed)
Follow up with your md this week. °

## 2017-02-18 DIAGNOSIS — E876 Hypokalemia: Secondary | ICD-10-CM | POA: Diagnosis not present

## 2017-02-18 DIAGNOSIS — E1165 Type 2 diabetes mellitus with hyperglycemia: Secondary | ICD-10-CM | POA: Diagnosis not present

## 2017-02-18 DIAGNOSIS — K8021 Calculus of gallbladder without cholecystitis with obstruction: Secondary | ICD-10-CM | POA: Diagnosis not present

## 2017-03-03 ENCOUNTER — Encounter: Payer: Self-pay | Admitting: Gastroenterology

## 2017-03-23 DIAGNOSIS — I10 Essential (primary) hypertension: Secondary | ICD-10-CM | POA: Diagnosis not present

## 2017-03-23 DIAGNOSIS — E1165 Type 2 diabetes mellitus with hyperglycemia: Secondary | ICD-10-CM | POA: Diagnosis not present

## 2017-03-23 DIAGNOSIS — E782 Mixed hyperlipidemia: Secondary | ICD-10-CM | POA: Diagnosis not present

## 2017-03-24 DIAGNOSIS — I10 Essential (primary) hypertension: Secondary | ICD-10-CM | POA: Diagnosis not present

## 2017-03-24 DIAGNOSIS — E1165 Type 2 diabetes mellitus with hyperglycemia: Secondary | ICD-10-CM | POA: Diagnosis not present

## 2017-03-24 DIAGNOSIS — Z23 Encounter for immunization: Secondary | ICD-10-CM | POA: Diagnosis not present

## 2017-03-24 DIAGNOSIS — E782 Mixed hyperlipidemia: Secondary | ICD-10-CM | POA: Diagnosis not present

## 2017-04-17 ENCOUNTER — Ambulatory Visit: Payer: Medicare Other | Admitting: Gastroenterology

## 2017-04-17 ENCOUNTER — Encounter: Payer: Self-pay | Admitting: Gastroenterology

## 2017-04-17 DIAGNOSIS — N289 Disorder of kidney and ureter, unspecified: Secondary | ICD-10-CM

## 2017-04-17 DIAGNOSIS — K8020 Calculus of gallbladder without cholecystitis without obstruction: Secondary | ICD-10-CM

## 2017-04-17 DIAGNOSIS — K760 Fatty (change of) liver, not elsewhere classified: Secondary | ICD-10-CM | POA: Diagnosis not present

## 2017-04-17 DIAGNOSIS — R1013 Epigastric pain: Secondary | ICD-10-CM | POA: Diagnosis not present

## 2017-04-17 DIAGNOSIS — K429 Umbilical hernia without obstruction or gangrene: Secondary | ICD-10-CM | POA: Insufficient documentation

## 2017-04-17 DIAGNOSIS — K4020 Bilateral inguinal hernia, without obstruction or gangrene, not specified as recurrent: Secondary | ICD-10-CM | POA: Insufficient documentation

## 2017-04-17 NOTE — Progress Notes (Signed)
cc'ed to pcp °

## 2017-04-17 NOTE — Assessment & Plan Note (Signed)
Radiologist recommended MRI renal protocol, patient states this was previously evaluated at an outside facility in the past.  Requested records for review.

## 2017-04-17 NOTE — Assessment & Plan Note (Signed)
Noted on imaging test.  Normal LFTs.  Patient with prior remote heavy alcohol use, diabetes for nearly 10 years.  Instructions for fatty liver: Recommend 1-2# weight loss per week until ideal body weight through exercise & diet. Low fat/cholesterol diet.   Avoid sweets, sodas, fruit juices, sweetened beverages like tea, etc. Gradually increase exercise from 15 min daily up to 1 hr per day 5 days/week. Limit alcohol use.

## 2017-04-17 NOTE — Assessment & Plan Note (Signed)
Episodic abdominal pain associated with nausea and vomiting.  Review of ED records, patient described more lower abdominal pain or migratory abdominal pain.  Today he states is all been epigastric and after meals.  He has at least one calcified gallstones seen on prior imaging.  LFTs normal with most recent ED visit.  Mild bump in total bilirubin with July ED visit.  Patient's description of symptoms today sound like biliary colic.  He also describes having history of gastric ulcer, symptoms do not appear consistent with that at this time given he does very well in between episodes.  His last episode of abdominal pain was 2 months ago.  We have requested prior endoscopy and colonoscopy for review.  Will make further recommendations more than likely will plan for referral to a general surgeon to consider cholecystectomy.  She does have some mild constipation, advised to take MiraLAX 17 g at bedtime on days he does not have a good bowel movement.  Patient voiced understanding.

## 2017-04-17 NOTE — Patient Instructions (Signed)
1. We will request prior records for review regarding colonoscopy and upper endoscopy and any imaging of your kidneys.  Further recommendations to follow once records reviewed. 2. Continue to consume low-fat diet. 3. You have fatty liver, may be a combination of alcohol and diabetes.  See below information.  Instructions for fatty liver: Recommend 1-2# weight loss per week until ideal body weight through exercise & diet. Low fat/cholesterol diet.   Avoid sweets, sodas, fruit juices, sweetened beverages like tea, etc. Gradually increase exercise from 15 min daily up to 1 hr per day 5 days/week. Limit alcohol use.

## 2017-04-17 NOTE — Progress Notes (Signed)
Primary Care Physician:  Benita StabileHall, Fatih Z, MD  Primary Gastroenterologist:  Jonette EvaSandi Fields, MD   Chief Complaint  Patient presents with  . Cholelithiasis    ER x 2    HPI:  Russell Morrow is a 66 y.o. male here at the request of Dr. Margo AyeHall for further evaluation of abdominal pain and known cholelithiasis. Patient evaluated in ED in 12/2016 and 02/2017.  She describes at least 3 isolated episodes since July.  According to both ED records, he presented with migratory abdominal pain with lower abdominal component.  Associated with nausea and vomiting.  Patient felt it was his diverticulitis, having been treated for diverticulitis in the past.  He had CT imaging both times.  Noted to have diverticulosis without inflammation on both CTs.  He also was noted to have calcified gallstone without acute cholecystitis.  Patient states he did not realize he had gallstones on his first visit.  No one told him.  After he was told he had gallstones in September "it all clicked".  Symptoms made sense to him.  It leaves his migratory abdominal pain was because "the stones were moving through my system".  "I felt to stones move".  Patient is retired Engineer, civil (consulting)nurse, previously worked in the ED, cardiac cath lab, cardiac rehab.  Prior to that was in [employer] medic.  His symptoms have been associated with vomiting.  Sometimes develops diarrhea before the symptoms start.  He has noted improvement since changing his diet, cutting back on fatty meats.  Previous episodes started after eating ribs.  Times lasting for hours prior to presentation to the ED.  Once he was given pain medication, fluids the symptoms resolved.  In between episodes he is completely pain-free.  Heartburn well controlled. No melena, brbpr.  Had some issues with constipation more recently.  May go a couple of days without a BM.  Then things "worked themselves out".  States he was found to be anemic after passing out several years ago.  [place]   Describes having a colonoscopy showing diverticulosis only.  No polyps.  Upper endoscopy showed "scar from old ulcer".   CT scan in the ED also showed bilateral small renal lesions that cannot completely be characterized, recommend renal protocol MRI.  He had small umbilical and bilateral inguinal hernias without complications.  Patient states she has had previous imaging of his renal lesions and was told "they were benign".  Try to obtain records.   Current Outpatient Medications  Medication Sig Dispense Refill  . acetaminophen (TYLENOL) 325 MG tablet Take 650 mg by mouth every 6 (six) hours as needed for mild pain or moderate pain.    . clonazePAM (KLONOPIN) 2 MG tablet Take 2 mg by mouth at bedtime.    . cloNIDine (CATAPRES) 0.2 MG tablet Take 0.2 mg by mouth 2 (two) times daily.    . diphenhydrAMINE (BENADRYL) 25 mg capsule Take 25 mg by mouth at bedtime.    Marland Kitchen. esomeprazole (NEXIUM) 40 MG capsule Take 40 mg by mouth daily.    . hydrochlorothiazide (HYDRODIURIL) 25 MG tablet Take 25 mg by mouth daily.    Marland Kitchen. HYDROcodone-acetaminophen (NORCO/VICODIN) 5-325 MG tablet Take 1 tablet by mouth every 6 (six) hours as needed for moderate pain. 20 tablet 0  . insulin glargine (LANTUS) 100 UNIT/ML injection Inject 30 Units at bedtime into the skin.     Marland Kitchen. irbesartan (AVAPRO) 300 MG tablet Take 300 mg by mouth daily.    .Marland Kitchen  Liraglutide (VICTOZA [place]) Inject 1.2 mLs into the skin at bedtime.    . metFORMIN (GLUCOPHAGE) 1000 MG tablet Take 1,000 mg by mouth 2 (two) times daily with a meal.    . metoprolol succinate (TOPROL-XL) 25 MG 24 hr tablet Take 25 mg daily by mouth.  5  . ondansetron (ZOFRAN ODT) 4 MG disintegrating tablet 4mg  ODT q4 hours prn nausea/vomit 12 tablet 0  . ondansetron (ZOFRAN) 4 MG tablet Take 1 tablet (4 mg total) by mouth every 6 (six) hours as needed for nausea or vomiting. 12 tablet 0  . potassium chloride SA (K-DUR,KLOR-CON) 20 MEQ tablet Take 2 tablets (40 mEq total) by mouth daily. 5  tablet 0   No current facility-administered medications for this visit.     Allergies as of 04/17/2017 - Review Complete 04/17/2017  Allergen Reaction Noted  . Statins  12/31/2016    Past Medical History:  Diagnosis Date  . Diabetes mellitus without complication (HCC)   . Diverticulitis   . Hypertension   . Myocardial infarct [facility](HCC)     Past Surgical History:  Procedure Laterality Date  . CERVICAL FUSION    . nasal septum repair      Family History  Problem Relation Age of Onset  . Cancer Mother        breast  . Hypertension Mother   . Diabetes Father   . Heart disease Father   . Hypertension Father   . Diabetes Brother   . Heart disease Brother   . Hypertension Brother   . Colon cancer Neg Hx     Social History   Socioeconomic History  . Marital status: Significant Other    Spouse name: Not on file  . Number of children: Not on file  . Years of education: Not on file  . Highest education level: Not on file  Social Needs  . Financial resource strain: Not on file  . Food insecurity - worry: Not on file  . Food insecurity - inability: Not on file  . Transportation needs - medical: Not on file  . Transportation needs - non-medical: Not on file  Occupational History  . Not on file  Tobacco Use  . Smoking status: Never Smoker  . Smokeless tobacco: Never Used  Substance and Sexual Activity  . Alcohol use: Yes    Alcohol/week: 8.4 oz    Types: 14 Glasses of wine per week    Comment: wine usually daily  . Drug use: Yes    Types: Marijuana  . Sexual activity: Not on file  Other Topics Concern  . Not on file  Social History Narrative  . Not on file      ROS:  General: Negative for anorexia, weight loss, fever, chills, fatigue, weakness. Eyes: Negative for vision changes.  ENT: Negative for hoarseness, difficulty swallowing , nasal congestion. CV: Negative for chest pain, angina, palpitations, dyspnea on exertion, peripheral edema.  Respiratory:  Negative for dyspnea at rest, dyspnea on exertion, cough, sputum, wheezing.  GI: See history of present illness. GU:  Negative for dysuria, hematuria, urinary incontinence, urinary frequency, nocturnal urination.  MS: Negative for joint pain, low back pain.  Derm: Negative for rash or itching.  Neuro: Negative for weakness, abnormal sensation, seizure, frequent headaches, memory loss, confusion.  Psych: Negative for anxiety, depression, suicidal ideation, hallucinations.  Endo: Negative for unusual weight change.  Heme: Negative for bruising or bleeding. Allergy: Negative for rash or hives.    Physical Examination:  BP (!) 160/100 (  BP Location: Right Arm, Cuff Size: Normal)   Pulse 60   Temp (!) 97 F (36.1 C) (Oral)   Ht 6\' 2"  (1.88 m)   Wt 241 lb 9.6 oz (109.6 kg)   BMI 31.02 kg/m    General: Well-nourished, well-developed in no acute distress.  Head: Normocephalic, atraumatic.   Eyes: Conjunctiva pink, no icterus. Mouth: Oropharyngeal mucosa moist and pink , no lesions erythema or exudate. Neck: Supple without thyromegaly, masses, or lymphadenopathy.  Lungs: Clear to auscultation bilaterally.  Heart: Regular rate and rhythm, no murmurs rubs or gallops.  Abdomen: Bowel sounds are normal, nontender, nondistended, no hepatosplenomegaly or masses, no abdominal bruits or    hernia , no rebound or guarding.   Rectal: not performed Extremities: No lower extremity edema. No clubbing or deformities.  Neuro: Alert and oriented x 4 , grossly normal neurologically.  Skin: Warm and dry, no rash or jaundice.   Psych: Alert and cooperative, normal mood and affect.  Labs: Lab Results  Component Value Date   CREATININE 0.70 02/16/2017   BUN 16 02/16/2017   NA 138 02/16/2017   K 3.4 (L) 02/16/2017   CL 98 (L) 02/16/2017   CO2 30 02/16/2017   Lab Results  Component Value Date   ALT 44 02/16/2017   AST 28 02/16/2017   ALKPHOS 54 02/16/2017   BILITOT 1.2 02/16/2017   Lab Results   Component Value Date   LIPASE 30 02/16/2017   Lab Results  Component Value Date   WBC 8.4 02/16/2017   HGB 16.6 02/16/2017   HCT 46.1 02/16/2017   MCV 83.2 02/16/2017   PLT 215 02/16/2017     Imaging Studies: No results found.

## 2017-04-28 DIAGNOSIS — E1165 Type 2 diabetes mellitus with hyperglycemia: Secondary | ICD-10-CM | POA: Diagnosis not present

## 2017-05-19 NOTE — Progress Notes (Signed)
Please let patient know that I received copy of CT enterography with contrast 09/22/12 and CT Abd with contrast 09/08/12. On that study there was s ingle right 8mm kidney lesions and on current ct there are indeterminate renal lesions on BOTH kidneys.   Also unsuccessful in obtaining copy of egd and colonoscopy reports. Can he get a copy for us?  Recommend MRI abd renal protocol to evaluate.  Can refer to surgeon for biliary symptoms if he wants.

## 2017-05-20 NOTE — Progress Notes (Signed)
Tried to call, all circuits busy.

## 2017-05-21 NOTE — Progress Notes (Signed)
LMOM to call.

## 2017-05-26 NOTE — Progress Notes (Signed)
Pt returned call and was informed.  He said he is doing a lot better now and does not want to do the referral to surgeon unless he has more problems. He also wants to think about the MRI, and he will let us know.  He is not sure that he can get a copy of the EGD and Colonoscopy but he will try.  Forwarding to Tana CoastLeslie Lewis, PA for FYI .

## 2017-05-26 NOTE — Progress Notes (Signed)
LMOM that I have a very important message for him to please call. Also, mailing a letter to call.

## 2017-07-01 DIAGNOSIS — I10 Essential (primary) hypertension: Secondary | ICD-10-CM | POA: Diagnosis not present

## 2017-07-01 DIAGNOSIS — E1165 Type 2 diabetes mellitus with hyperglycemia: Secondary | ICD-10-CM | POA: Diagnosis not present

## 2017-07-03 DIAGNOSIS — E1165 Type 2 diabetes mellitus with hyperglycemia: Secondary | ICD-10-CM | POA: Diagnosis not present

## 2017-09-08 DIAGNOSIS — E782 Mixed hyperlipidemia: Secondary | ICD-10-CM | POA: Diagnosis not present

## 2017-09-08 DIAGNOSIS — Z8631 Personal history of diabetic foot ulcer: Secondary | ICD-10-CM | POA: Diagnosis not present

## 2017-09-08 DIAGNOSIS — I10 Essential (primary) hypertension: Secondary | ICD-10-CM | POA: Diagnosis not present

## 2017-09-08 DIAGNOSIS — E1165 Type 2 diabetes mellitus with hyperglycemia: Secondary | ICD-10-CM | POA: Diagnosis not present

## 2017-09-10 DIAGNOSIS — K8020 Calculus of gallbladder without cholecystitis without obstruction: Secondary | ICD-10-CM | POA: Diagnosis not present

## 2017-09-10 DIAGNOSIS — E1165 Type 2 diabetes mellitus with hyperglycemia: Secondary | ICD-10-CM | POA: Diagnosis not present

## 2017-09-10 DIAGNOSIS — E876 Hypokalemia: Secondary | ICD-10-CM | POA: Diagnosis not present

## 2017-09-10 DIAGNOSIS — I10 Essential (primary) hypertension: Secondary | ICD-10-CM | POA: Diagnosis not present

## 2017-09-10 DIAGNOSIS — E782 Mixed hyperlipidemia: Secondary | ICD-10-CM | POA: Diagnosis not present

## 2017-12-08 DIAGNOSIS — N390 Urinary tract infection, site not specified: Secondary | ICD-10-CM | POA: Diagnosis not present

## 2017-12-08 DIAGNOSIS — E782 Mixed hyperlipidemia: Secondary | ICD-10-CM | POA: Diagnosis not present

## 2017-12-08 DIAGNOSIS — E1165 Type 2 diabetes mellitus with hyperglycemia: Secondary | ICD-10-CM | POA: Diagnosis not present

## 2017-12-11 DIAGNOSIS — E1169 Type 2 diabetes mellitus with other specified complication: Secondary | ICD-10-CM | POA: Diagnosis not present

## 2017-12-11 DIAGNOSIS — I10 Essential (primary) hypertension: Secondary | ICD-10-CM | POA: Diagnosis not present

## 2017-12-11 DIAGNOSIS — M791 Myalgia, unspecified site: Secondary | ICD-10-CM | POA: Diagnosis not present

## 2017-12-11 DIAGNOSIS — E782 Mixed hyperlipidemia: Secondary | ICD-10-CM | POA: Diagnosis not present

## 2017-12-11 DIAGNOSIS — E876 Hypokalemia: Secondary | ICD-10-CM | POA: Diagnosis not present

## 2018-03-25 DIAGNOSIS — Z0000 Encounter for general adult medical examination without abnormal findings: Secondary | ICD-10-CM | POA: Diagnosis not present

## 2018-03-25 DIAGNOSIS — E782 Mixed hyperlipidemia: Secondary | ICD-10-CM | POA: Diagnosis not present

## 2018-03-25 DIAGNOSIS — E1165 Type 2 diabetes mellitus with hyperglycemia: Secondary | ICD-10-CM | POA: Diagnosis not present

## 2018-03-25 DIAGNOSIS — Z23 Encounter for immunization: Secondary | ICD-10-CM | POA: Diagnosis not present

## 2018-03-25 DIAGNOSIS — I10 Essential (primary) hypertension: Secondary | ICD-10-CM | POA: Diagnosis not present

## 2018-03-25 DIAGNOSIS — E1169 Type 2 diabetes mellitus with other specified complication: Secondary | ICD-10-CM | POA: Diagnosis not present

## 2018-07-02 DIAGNOSIS — I10 Essential (primary) hypertension: Secondary | ICD-10-CM | POA: Diagnosis not present

## 2018-07-02 DIAGNOSIS — E1169 Type 2 diabetes mellitus with other specified complication: Secondary | ICD-10-CM | POA: Diagnosis not present

## 2018-07-02 DIAGNOSIS — E782 Mixed hyperlipidemia: Secondary | ICD-10-CM | POA: Diagnosis not present

## 2018-07-02 DIAGNOSIS — E1165 Type 2 diabetes mellitus with hyperglycemia: Secondary | ICD-10-CM | POA: Diagnosis not present

## 2018-07-07 DIAGNOSIS — K8020 Calculus of gallbladder without cholecystitis without obstruction: Secondary | ICD-10-CM | POA: Diagnosis not present

## 2018-07-07 DIAGNOSIS — E1169 Type 2 diabetes mellitus with other specified complication: Secondary | ICD-10-CM | POA: Diagnosis not present

## 2018-07-07 DIAGNOSIS — E876 Hypokalemia: Secondary | ICD-10-CM | POA: Diagnosis not present

## 2018-07-07 DIAGNOSIS — E782 Mixed hyperlipidemia: Secondary | ICD-10-CM | POA: Diagnosis not present

## 2018-07-07 DIAGNOSIS — Z23 Encounter for immunization: Secondary | ICD-10-CM | POA: Diagnosis not present

## 2018-07-28 ENCOUNTER — Encounter (HOSPITAL_COMMUNITY): Payer: Self-pay | Admitting: Emergency Medicine

## 2018-07-28 ENCOUNTER — Observation Stay (HOSPITAL_COMMUNITY)
Admission: EM | Admit: 2018-07-28 | Discharge: 2018-07-30 | Disposition: A | Discharge status: Home / Self Care | Payer: Medicare Other | Attending: Family Medicine | Admitting: Family Medicine

## 2018-07-28 ENCOUNTER — Emergency Department (HOSPITAL_COMMUNITY): Payer: Medicare Other

## 2018-07-28 ENCOUNTER — Other Ambulatory Visit: Payer: Self-pay

## 2018-07-28 DIAGNOSIS — E785 Hyperlipidemia, unspecified: Secondary | ICD-10-CM | POA: Diagnosis not present

## 2018-07-28 DIAGNOSIS — I252 Old myocardial infarction: Secondary | ICD-10-CM | POA: Insufficient documentation

## 2018-07-28 DIAGNOSIS — N2889 Other specified disorders of kidney and ureter: Secondary | ICD-10-CM | POA: Insufficient documentation

## 2018-07-28 DIAGNOSIS — I4891 Unspecified atrial fibrillation: Secondary | ICD-10-CM | POA: Diagnosis present

## 2018-07-28 DIAGNOSIS — R079 Chest pain, unspecified: Secondary | ICD-10-CM | POA: Insufficient documentation

## 2018-07-28 DIAGNOSIS — E876 Hypokalemia: Secondary | ICD-10-CM | POA: Insufficient documentation

## 2018-07-28 DIAGNOSIS — R823 Hemoglobinuria: Secondary | ICD-10-CM | POA: Insufficient documentation

## 2018-07-28 DIAGNOSIS — K8020 Calculus of gallbladder without cholecystitis without obstruction: Secondary | ICD-10-CM | POA: Insufficient documentation

## 2018-07-28 DIAGNOSIS — I10 Essential (primary) hypertension: Secondary | ICD-10-CM | POA: Diagnosis present

## 2018-07-28 DIAGNOSIS — I472 Ventricular tachycardia, unspecified: Secondary | ICD-10-CM

## 2018-07-28 DIAGNOSIS — K5730 Diverticulosis of large intestine without perforation or abscess without bleeding: Secondary | ICD-10-CM | POA: Insufficient documentation

## 2018-07-28 DIAGNOSIS — I471 Supraventricular tachycardia: Secondary | ICD-10-CM

## 2018-07-28 DIAGNOSIS — E119 Type 2 diabetes mellitus without complications: Secondary | ICD-10-CM

## 2018-07-28 DIAGNOSIS — I2510 Atherosclerotic heart disease of native coronary artery without angina pectoris: Secondary | ICD-10-CM | POA: Insufficient documentation

## 2018-07-28 DIAGNOSIS — I77810 Thoracic aortic ectasia: Secondary | ICD-10-CM | POA: Insufficient documentation

## 2018-07-28 DIAGNOSIS — Z7289 Other problems related to lifestyle: Secondary | ICD-10-CM | POA: Insufficient documentation

## 2018-07-28 DIAGNOSIS — Z79899 Other long term (current) drug therapy: Secondary | ICD-10-CM | POA: Insufficient documentation

## 2018-07-28 DIAGNOSIS — I493 Ventricular premature depolarization: Secondary | ICD-10-CM | POA: Diagnosis not present

## 2018-07-28 DIAGNOSIS — D72829 Elevated white blood cell count, unspecified: Secondary | ICD-10-CM | POA: Insufficient documentation

## 2018-07-28 DIAGNOSIS — N289 Disorder of kidney and ureter, unspecified: Secondary | ICD-10-CM

## 2018-07-28 DIAGNOSIS — Z794 Long term (current) use of insulin: Secondary | ICD-10-CM | POA: Insufficient documentation

## 2018-07-28 DIAGNOSIS — R7989 Other specified abnormal findings of blood chemistry: Secondary | ICD-10-CM | POA: Diagnosis not present

## 2018-07-28 DIAGNOSIS — I452 Bifascicular block: Secondary | ICD-10-CM | POA: Insufficient documentation

## 2018-07-28 DIAGNOSIS — Z833 Family history of diabetes mellitus: Secondary | ICD-10-CM | POA: Insufficient documentation

## 2018-07-28 DIAGNOSIS — Z8249 Family history of ischemic heart disease and other diseases of the circulatory system: Secondary | ICD-10-CM | POA: Insufficient documentation

## 2018-07-28 DIAGNOSIS — E1169 Type 2 diabetes mellitus with other specified complication: Secondary | ICD-10-CM

## 2018-07-28 DIAGNOSIS — Z950 Presence of cardiac pacemaker: Secondary | ICD-10-CM | POA: Diagnosis not present

## 2018-07-28 DIAGNOSIS — Z888 Allergy status to other drugs, medicaments and biological substances status: Secondary | ICD-10-CM | POA: Insufficient documentation

## 2018-07-28 HISTORY — DX: Atherosclerotic heart disease of native coronary artery without angina pectoris: I25.10

## 2018-07-28 LAB — CBC
HCT: 49.2 % (ref 39.0–52.0)
Hemoglobin: 16.5 g/dL (ref 13.0–17.0)
MCH: 28 pg (ref 26.0–34.0)
MCHC: 33.5 g/dL (ref 30.0–36.0)
MCV: 83.5 fL (ref 80.0–100.0)
Platelets: 256 10*3/uL (ref 150–400)
RBC: 5.89 MIL/uL — ABNORMAL HIGH (ref 4.22–5.81)
RDW: 13.9 % (ref 11.5–15.5)
WBC: 14.3 10*3/uL — ABNORMAL HIGH (ref 4.0–10.5)
nRBC: 0 % (ref 0.0–0.2)

## 2018-07-28 LAB — COMPREHENSIVE METABOLIC PANEL
ALBUMIN: 4.7 g/dL (ref 3.5–5.0)
ALK PHOS: 53 U/L (ref 38–126)
ALT: 26 U/L (ref 0–44)
AST: 19 U/L (ref 15–41)
Anion gap: 16 — ABNORMAL HIGH (ref 5–15)
BUN: 13 mg/dL (ref 8–23)
CALCIUM: 9.5 mg/dL (ref 8.9–10.3)
CO2: 24 mmol/L (ref 22–32)
CREATININE: 0.84 mg/dL (ref 0.61–1.24)
Chloride: 99 mmol/L (ref 98–111)
GFR calc Af Amer: >60 mL/min (ref >60)
GFR calc non Af Amer: >60 mL/min (ref >60)
GLUCOSE: 243 mg/dL — AB (ref 70–99)
Potassium: 3.3 mmol/L — ABNORMAL LOW (ref 3.5–5.1)
SODIUM: 139 mmol/L (ref 135–145)
Total Bilirubin: 1 mg/dL (ref 0.3–1.2)
Total Protein: 8 g/dL (ref 6.5–8.1)

## 2018-07-28 LAB — CBG MONITORING, ED
Glucose-Capillary: 248 mg/dL — ABNORMAL HIGH (ref 70–99)
Glucose-Capillary: 269 mg/dL — ABNORMAL HIGH (ref 70–99)

## 2018-07-28 LAB — PROTIME-INR
INR: 1.06
Prothrombin Time: 13.7 seconds (ref 11.4–15.2)

## 2018-07-28 LAB — URINALYSIS, ROUTINE W REFLEX MICROSCOPIC
BACTERIA UA: NONE SEEN
BILIRUBIN URINE: NEGATIVE
Glucose, UA: >=500 mg/dL — AB
Ketones, ur: 80 mg/dL — AB
Leukocytes,Ua: NEGATIVE
Nitrite: NEGATIVE
Protein, ur: 100 mg/dL — AB
Specific Gravity, Urine: 1.031 — ABNORMAL HIGH (ref 1.005–1.030)
pH: 5 (ref 5.0–8.0)

## 2018-07-28 LAB — APTT: aPTT: 31 seconds (ref 24–36)

## 2018-07-28 LAB — LIPASE, BLOOD: Lipase: 32 U/L (ref 11–51)

## 2018-07-28 LAB — PHOSPHORUS: Phosphorus: 2.2 mg/dL — ABNORMAL LOW (ref 2.5–4.6)

## 2018-07-28 LAB — TROPONIN I: TROPONIN I: 0.05 ng/mL — AB (ref <0.03)

## 2018-07-28 LAB — MAGNESIUM: Magnesium: 2 mg/dL (ref 1.7–2.4)

## 2018-07-28 MED ORDER — PANTOPRAZOLE SODIUM 40 MG PO TBEC
40.0000 mg | DELAYED_RELEASE_TABLET | Freq: Every day | ORAL | Status: DC
  Administered 2018-07-29 – 2018-07-30 (×2): 40 mg via ORAL
  Filled 2018-07-28 (×2): qty 1

## 2018-07-28 MED ORDER — ENOXAPARIN SODIUM 40 MG/0.4ML ~~LOC~~ SOLN
40.0000 mg | SUBCUTANEOUS | Status: DC
  Administered 2018-07-28 – 2018-07-29 (×2): 40 mg via SUBCUTANEOUS
  Filled 2018-07-28 (×2): qty 0.4

## 2018-07-28 MED ORDER — METOPROLOL TARTRATE 5 MG/5ML IV SOLN
5.0000 mg | INTRAVENOUS | Status: DC | PRN
  Administered 2018-07-28 (×2): 5 mg via INTRAVENOUS
  Filled 2018-07-28 (×2): qty 5

## 2018-07-28 MED ORDER — CANAGLIFLOZIN 100 MG PO TABS: 100.0000 mg | ORAL_TABLET | Freq: Every day | ORAL | Status: DC

## 2018-07-28 MED ORDER — INSULIN GLARGINE 100 UNIT/ML ~~LOC~~ SOLN
28.0000 [IU] | Freq: Every day | SUBCUTANEOUS | Status: DC
  Administered 2018-07-28 – 2018-07-29 (×2): 28 [IU] via SUBCUTANEOUS
  Filled 2018-07-28 (×5): qty 0.28

## 2018-07-28 MED ORDER — ROPINIROLE HCL 1 MG PO TABS
2.0000 mg | ORAL_TABLET | Freq: Every day | ORAL | Status: DC
  Administered 2018-07-28 – 2018-07-29 (×2): 2 mg via ORAL
  Filled 2018-07-28 (×7): qty 2

## 2018-07-28 MED ORDER — ONDANSETRON 4 MG PO TBDP
4.0000 mg | ORAL_TABLET | Freq: Once | ORAL | Status: AC | PRN
  Administered 2018-07-28: 4 mg via ORAL

## 2018-07-28 MED ORDER — CLONIDINE HCL 0.2 MG PO TABS
0.2000 mg | ORAL_TABLET | Freq: Two times a day (BID) | ORAL | Status: DC
  Administered 2018-07-29 – 2018-07-30 (×3): 0.2 mg via ORAL
  Filled 2018-07-28 (×3): qty 1

## 2018-07-28 MED ORDER — METOPROLOL SUCCINATE ER 25 MG PO TB24
25.0000 mg | ORAL_TABLET | Freq: Every day | ORAL | Status: DC
  Administered 2018-07-28 – 2018-07-29 (×2): 25 mg via ORAL
  Filled 2018-07-28 (×2): qty 1

## 2018-07-28 MED ORDER — METFORMIN HCL 500 MG PO TABS: 1000.0000 mg | ORAL_TABLET | Freq: Two times a day (BID) | ORAL | Status: DC

## 2018-07-28 MED ORDER — ONDANSETRON HCL 4 MG/2ML IJ SOLN
4.0000 mg | Freq: Once | INTRAMUSCULAR | Status: AC
  Administered 2018-07-28: 4 mg via INTRAVENOUS
  Filled 2018-07-28: qty 2

## 2018-07-28 MED ORDER — SODIUM CHLORIDE 0.9% FLUSH
3.0000 mL | Freq: Once | INTRAVENOUS | Status: AC
  Administered 2018-07-28: 3 mL via INTRAVENOUS

## 2018-07-28 MED ORDER — SODIUM CHLORIDE 0.9 % IV BOLUS
1000.0000 mL | Freq: Once | INTRAVENOUS | Status: AC
  Administered 2018-07-28: 1000 mL via INTRAVENOUS

## 2018-07-28 MED ORDER — ONDANSETRON 4 MG PO TBDP
ORAL_TABLET | ORAL | Status: AC
  Filled 2018-07-28: qty 1

## 2018-07-28 MED ORDER — K PHOS MONO-SOD PHOS DI & MONO 155-852-130 MG PO TABS
500.0000 mg | ORAL_TABLET | Freq: Two times a day (BID) | ORAL | Status: AC
  Administered 2018-07-28: 500 mg via ORAL
  Filled 2018-07-28 (×3): qty 2

## 2018-07-28 MED ORDER — POTASSIUM CHLORIDE 10 MEQ/100ML IV SOLN
10.0000 meq | INTRAVENOUS | Status: AC
  Administered 2018-07-28 – 2018-07-29 (×4): 10 meq via INTRAVENOUS
  Filled 2018-07-28 (×4): qty 100

## 2018-07-28 MED ORDER — TEMAZEPAM 15 MG PO CAPS: 15.0000 mg | ORAL_CAPSULE | Freq: Every evening | ORAL | Status: DC | PRN

## 2018-07-28 MED ORDER — MAGNESIUM SULFATE 2 GM/50ML IV SOLN
2.0000 g | Freq: Once | INTRAVENOUS | Status: AC
  Administered 2018-07-28: 2 g via INTRAVENOUS
  Filled 2018-07-28: qty 50

## 2018-07-28 MED ORDER — CLONIDINE HCL 0.2 MG PO TABS: 0.2000 mg | ORAL_TABLET | Freq: Two times a day (BID) | ORAL | Status: DC

## 2018-07-28 MED ORDER — POTASSIUM CHLORIDE IN NACL 20-0.9 MEQ/L-% IV SOLN
INTRAVENOUS | Status: AC
  Administered 2018-07-29: 03:00:00 via INTRAVENOUS
  Filled 2018-07-28: qty 1000

## 2018-07-28 MED ORDER — LIRAGLUTIDE 18 MG/3ML ~~LOC~~ SOPN: 1.2000 mg | PEN_INJECTOR | Freq: Every day | SUBCUTANEOUS | Status: DC

## 2018-07-28 MED ORDER — HYDROMORPHONE HCL 1 MG/ML IJ SOLN
1.0000 mg | Freq: Once | INTRAMUSCULAR | Status: AC
  Administered 2018-07-28: 1 mg via INTRAVENOUS
  Filled 2018-07-28: qty 1

## 2018-07-28 MED ORDER — IOHEXOL 300 MG/ML  SOLN
100.0000 mL | Freq: Once | INTRAMUSCULAR | Status: AC | PRN
  Administered 2018-07-28: 100 mL via INTRAVENOUS

## 2018-07-28 MED ORDER — POTASSIUM CHLORIDE CRYS ER 20 MEQ PO TBCR
40.0000 meq | EXTENDED_RELEASE_TABLET | Freq: Every day | ORAL | Status: DC
  Administered 2018-07-29 – 2018-07-30 (×2): 40 meq via ORAL
  Filled 2018-07-28 (×2): qty 2

## 2018-07-28 MED ORDER — FAMOTIDINE IN NACL 20-0.9 MG/50ML-% IV SOLN
20.0000 mg | Freq: Once | INTRAVENOUS | Status: AC
  Administered 2018-07-28: 20 mg via INTRAVENOUS
  Filled 2018-07-28: qty 50

## 2018-07-28 MED ORDER — CLONIDINE HCL 0.2 MG PO TABS
0.2000 mg | ORAL_TABLET | Freq: Once | ORAL | Status: AC
  Administered 2018-07-28: 0.2 mg via ORAL
  Filled 2018-07-28: qty 1

## 2018-07-28 MED ORDER — ACETAMINOPHEN 650 MG RE SUPP: 650.0000 mg | Freq: Four times a day (QID) | RECTAL | Status: DC | PRN

## 2018-07-28 MED ORDER — INSULIN ASPART 100 UNIT/ML ~~LOC~~ SOLN
0.0000 [IU] | Freq: Three times a day (TID) | SUBCUTANEOUS | Status: DC
  Administered 2018-07-29 (×2): 3 [IU] via SUBCUTANEOUS
  Administered 2018-07-30: 5 [IU] via SUBCUTANEOUS

## 2018-07-28 MED ORDER — SODIUM CHLORIDE 0.9 % IV SOLN
INTRAVENOUS | Status: DC
  Administered 2018-07-28: 17:00:00 via INTRAVENOUS

## 2018-07-28 MED ORDER — ACETAMINOPHEN 325 MG PO TABS: 650.0000 mg | ORAL_TABLET | Freq: Four times a day (QID) | ORAL | Status: DC | PRN

## 2018-07-28 MED ORDER — OLMESARTAN-AMLODIPINE-HCTZ 40-5-25 MG PO TABS: 1.0000 | ORAL_TABLET | Freq: Every day | ORAL | Status: DC

## 2018-07-28 MED ORDER — ESOMEPRAZOLE MAGNESIUM 20 MG PO TBEC: 20.0000 mg | DELAYED_RELEASE_TABLET | Freq: Two times a day (BID) | ORAL | Status: DC

## 2018-07-28 NOTE — ED Triage Notes (Signed)
Upper abd pain, N/V since last night.  Hx of gallstones

## 2018-07-28 NOTE — ED Provider Notes (Signed)
[facility] Provider Note   CSN: [id number] Arrival date & time: 07/28/18  1213    History   Chief Complaint Chief Complaint  Patient presents with  . Abdominal Pain    HPI Russell [facility]row is a 68 y.o. male.     Patient presenting with upper abdominal pain mostly epigastric and right upper quadrant nausea and vomiting that started last night.  Patient has a known history of gallstones for the past 2 years but is opted not to have the gallbladder removed.  Patient also has a past medical history of diverticulitis hypertension diabetes and coronary artery disease.  Patient has vomited multiple times.  Patient states he was not able to take any of his medications today.  Patient is on Klonopin Catapres Nexium Glucophage Toprol XL.  Patient's primary care doctor is Dr. Margo Morrow.  Patient states that the abdominal pain is 8 out of 10.     Past Medical History:  Diagnosis Date  . Diabetes mellitus without complication (HCC)   . Diverticulitis   . Hypertension   . Myocardial infarct [facility](HCC)     Patient Active Problem List   Diagnosis Date Noted  . Atrial fibrillation with RVR (HCC) 07/28/2018  . Umbilical hernia 04/17/2017  . Inguinal hernia, bilateral 04/17/2017  . Cholelithiasis 04/17/2017  . Abdominal pain, epigastric 04/17/2017  . Lesion of both native kidneys 04/17/2017  . Fatty liver 04/17/2017  . Hypertension     Past Surgical History:  Procedure Laterality Date  . CERVICAL FUSION    . nasal septum repair          Home Medications    Prior to Admission medications   Medication Sig Start Date End Date Taking? Authorizing Provider  acetaminophen (TYLENOL) 325 MG tablet Take 650 mg by mouth every 6 (six) hours as needed for mild pain or moderate pain.   Yes [provider]  cloNIDine (CATAPRES) 0.2 MG tablet Take 0.2 mg by mouth 2 (two) times daily.   Yes [provider]  diphenhydrAMINE (BENADRYL) 25 mg capsule Take 25 mg by mouth  at bedtime.   Yes [provider]  empagliflozin (JARDIANCE) 10 MG TABS tablet Take 10 mg by mouth daily.   Yes [provider]  Esomeprazole Magnesium 20 MG TBEC Take 20 mg by mouth 2 (two) times daily before a meal.    Yes [provider]  insulin glargine (LANTUS) 100 UNIT/ML injection Inject 28 Units into the skin at bedtime.    Yes [provider]  Liraglutide (VICTOZA [place]) Inject 1.8 mLs into the skin at bedtime.    Yes [provider]  metFORMIN (GLUCOPHAGE) 1000 MG tablet Take 1,000 mg by mouth 2 (two) times daily with a meal.   Yes [provider]  metoprolol succinate (TOPROL-XL) 25 MG 24 hr tablet Take 25 mg daily by mouth. 03/24/17  Yes [provider]  Olmesartan-amLODIPine-HCTZ 40-5-25 MG TABS Take 1 tablet by mouth daily. 07/24/18  Yes [provider]  potassium chloride SA (K-DUR,KLOR-CON) 20 MEQ tablet Take 2 tablets (40 mEq total) by mouth daily. Patient taking differently: Take 20 mEq by mouth daily.  12/31/16  Yes Loren RacerYelverton, David, MD  rOPINIRole (REQUIP) 1 MG tablet Take 2 mg by mouth at bedtime. 07/07/18  Yes [provider]  temazepam (RESTORIL) 15 MG capsule Take 15 mg by mouth at bedtime. 07/07/18  Yes [provider]    Family History Family History  Problem Relation Age of Onset  .  Cancer Mother        breast  . Hypertension Mother   . Diabetes Father   . Heart disease Father   . Hypertension Father   . Diabetes Brother   . Heart disease Brother   . Hypertension Brother   . Colon cancer Neg Hx     Social History Social History   Tobacco Use  . Smoking status: Never Smoker  . Smokeless tobacco: Never Used  Substance Use Topics  . Alcohol use: Yes    Alcohol/week: 14.0 standard drinks    Types: 14 Glasses of wine per week    Comment: wine usually daily  . Drug use: Yes    Types: Marijuana     Allergies   Statins   Review of Systems Review of Systems   Constitutional: Positive for fatigue. Negative for chills and fever.  HENT: Negative for rhinorrhea and sore throat.   Eyes: Negative for visual disturbance.  Respiratory: Negative for cough and shortness of breath.   Cardiovascular: Negative for chest pain and leg swelling.  Gastrointestinal: Positive for abdominal pain, nausea and vomiting. Negative for diarrhea.  Genitourinary: Negative for dysuria.  Musculoskeletal: Negative for back pain and neck pain.  Skin: Negative for rash.  Neurological: Negative for dizziness, light-headedness and headaches.  Hematological: Does not bruise/bleed easily.  Psychiatric/Behavioral: Negative for confusion.     Physical Exam Updated Vital Signs BP 139/90   Pulse (!) 30   Temp 97.8 F (36.6 C) (Oral)   Resp (!) 23   Ht 1.88 m (6\' 2" )   Wt 102.1 kg   SpO2 97%   BMI 28.89 kg/m   Physical Exam Vitals signs and nursing note reviewed.  Constitutional:      Appearance: He is well-developed. He is ill-appearing.  HENT:     Head: Normocephalic and atraumatic.     Mouth/Throat:     Mouth: Mucous membranes are dry.  Eyes:     Extraocular Movements: Extraocular movements intact.     Conjunctiva/sclera: Conjunctivae normal.     Pupils: Pupils are equal, round, and reactive to light.  Neck:     Musculoskeletal: Normal range of motion and neck supple.  Cardiovascular:     Rate and Rhythm: Normal rate and regular rhythm.     Heart sounds: Normal heart sounds. No murmur.  Pulmonary:     Effort: Pulmonary effort is normal. No respiratory distress.     Breath sounds: Normal breath sounds. No wheezing.  Abdominal:     General: Bowel sounds are normal.     Palpations: Abdomen is soft.     Tenderness: There is no abdominal tenderness.     Comments: Abdomen nontender to palpation no guarding.  Patient does have subjective abdominal pain but no objective abdominal pain on exam.  Musculoskeletal: Normal range of motion.  Skin:    General: Skin is  warm and dry.  Neurological:     General: No focal deficit present.     Mental Status: He is alert and oriented to person, place, and time.      ED Treatments / Results  Labs (all labs ordered are listed, but only abnormal results are displayed) Labs Reviewed  COMPREHENSIVE METABOLIC PANEL - Abnormal; Notable for the following components:      Result Value   Potassium 3.3 (*)    Glucose, Bld 243 (*)    Anion gap 16 (*)    All other components within normal limits  CBC - Abnormal; Notable for the  following components:   WBC 14.3 (*)    RBC 5.89 (*)    All other components within normal limits  URINALYSIS, ROUTINE W REFLEX MICROSCOPIC - Abnormal; Notable for the following components:   Specific Gravity, Urine 1.031 (*)    Glucose, UA >=500 (*)    Hgb urine dipstick SMALL (*)    Ketones, ur 80 (*)    Protein, ur 100 (*)    All other components within normal limits  PHOSPHORUS - Abnormal; Notable for the following components:   Phosphorus 2.2 (*)    All other components within normal limits  LIPASE, BLOOD  MAGNESIUM    EKG EKG Interpretation  Date/Time:  Wednesday July 28 2018 18:11:58 EST Ventricular Rate:  156 PR Interval:    QRS Duration: 117 QT Interval:  306 QTC Calculation: 493 R Axis:   -91 Text Interpretation:  Atrial fibrillation with rapid V-rate Ventricular tachycardia, unsustained Right bundle branch block Inferior infarct, old Confirmed by Vanetta Mulders [phone]) on 07/28/2018 6:56:13 PM   Radiology Ct Abdomen Pelvis W Contrast  Result Date: 07/28/2018 CLINICAL DATA:  Upper abdominal pain with nausea and vomiting EXAM: CT ABDOMEN AND PELVIS WITH CONTRAST TECHNIQUE: Multidetector CT imaging of the abdomen and pelvis was performed using the standard protocol following bolus administration of intravenous contrast. CONTRAST:  OMNIPAQUE IOHEXOL 300 MG/ML  SOLN COMPARISON:  CT 02/16/2017 FINDINGS: Lower chest: Lung bases demonstrate subsegmental  atelectasis at the right base. No pleural effusion. The heart size is within normal limits. Hepatobiliary: Small stone in the gallbladder. No focal hepatic abnormality or biliary dilatation Pancreas: Unremarkable. No pancreatic ductal dilatation or surrounding inflammatory changes. Spleen: Normal in size without focal abnormality. Adrenals/Urinary Tract: Adrenal glands are normal. No hydronephrosis. Subcentimeter hypodensities within the kidneys, too small to further characterize. 17 mm intermediate density lesion mid right kidney. 12 mm intermediate density medial midpole left kidney. 14 mm intermediate density midpole posterior left kidney. Bladder is normal. Stomach/Bowel: Stomach is within normal limits. Appendix appears normal. No evidence of bowel wall thickening, distention, or inflammatory changes. Sigmoid colon diverticular disease without acute inflammatory process. Vascular/Lymphatic: Mild aortic atherosclerosis. No aneurysm. No significantly enlarged lymph nodes. Reproductive: Prostate is unremarkable. Other: Fat within the left greater than right inguinal canal. No free air or free fluid Musculoskeletal: No acute or suspicious abnormality. Degenerative change most prominent at L4-L5. IMPRESSION: 1. No CT evidence for acute intra-abdominal or pelvic abnormality. 2. Indeterminate bilateral small renal lesions. Suggest nonemergent MRI for further evaluation. 3. Small stone in the gallbladder 4. Sigmoid colon diverticular disease without acute inflammatory change Electronically Signed   By: Jasmine Pang M.D.   On: 07/28/2018 19:31    Procedures Procedures (including critical care time)  CRITICAL CARE Performed by: Vanetta Mulders Total critical care time: 30 minutes Critical care time was exclusive of separately billable procedures and treating other patients. Critical care was necessary to treat or prevent imminent or life-threatening deterioration. Critical care was time spent personally by me  on the following activities: development of treatment plan with patient and/or surrogate as well as nursing, discussions with consultants, evaluation of patient's response to treatment, examination of patient, obtaining history from patient or surrogate, ordering and performing treatments and interventions, ordering and review of laboratory studies, ordering and review of radiographic studies, pulse oximetry and re-evaluation of patient's condition.   Medications Ordered in ED Medications  metoprolol tartrate (LOPRESSOR) injection 5 mg (5 mg Intravenous Given 07/28/18 1932)  0.9 % NaCl with KCl 20 mEq/  L  infusion (has no administration in time range)  magnesium sulfate IVPB 2 g 50 mL (2 g Intravenous New Bag/Given 07/28/18 2057)  potassium chloride 10 mEq in 100 mL IVPB (has no administration in time range)  sodium chloride flush (NS) 0.9 % injection 3 mL (3 mLs Intravenous Given 07/28/18 1653)  ondansetron (ZOFRAN-ODT) disintegrating tablet 4 mg (4 mg Oral Given 07/28/18 1227)  sodium chloride 0.9 % bolus 1,000 mL (0 mLs Intravenous Stopped 07/28/18 1924)  ondansetron (ZOFRAN) injection 4 mg (4 mg Intravenous Given 07/28/18 1653)  HYDROmorphone (DILAUDID) injection 1 mg (1 mg Intravenous Given 07/28/18 1653)  HYDROmorphone (DILAUDID) injection 1 mg (1 mg Intravenous Given 07/28/18 1845)  iohexol (OMNIPAQUE) 300 MG/ML solution 100 mL (100 mLs Intravenous Contrast Given 07/28/18 1909)  HYDROmorphone (DILAUDID) injection 1 mg (1 mg Intravenous Given 07/28/18 2023)  cloNIDine (CATAPRES) tablet 0.2 mg (0.2 mg Oral Given 07/28/18 2029)     Initial Impression / Assessment and Plan / ED Course  I have reviewed the triage vital signs and the nursing notes.  Pertinent labs & imaging results that were available during my care of the patient were reviewed by me and considered in my medical decision making (see chart for details).        Patient's initial work-up was for biliary colic or acute cholecystitis  based on the presentation.  Pain started last night.  Reassuring that there was no tenderness to the right upper quadrant to be suggestive of acute cholecystitis.  Patient's liver function test without any significant abnormalities lipase was fine bilirubin was not up.  Did have a leukocytosis.  That could be due to the vomiting.  Patient blood sugar was in the 200 range.  Patient was hypertensive on presentation 170/105.  Initially heart rate 88.  Without any significant rhythm abnormality.  CT scan of the abdomen showed a single gallstone no thickening of the gallbladder common bile duct was normal.  No other acute abnormalities.  Patient's potassium was slightly low at 3.3.  Blood sugar as mentioned was in the 200 range.  But not consistent with DKA.  Patient went on to develop some episodes of atrial fibrillation he is not on a blood thinner not sure why he has a history of atrial fib according to patient.  With rapid ventricular rates up into the 156 air range.  Also nurse did capture while she was in their a run of V. tach.  Patient was completely asymptomatic while this occurred.  Call placed to general surgery for consultation regarding the history of gallstones but clinically I do not feel that this is acute cholecystitis.  Also not even sure it is biliary colic based on the duration of the pain since last night.  But do not have a good explanation for his epigastric right upper quadrant pain.  Purpose of general surgery consult was just to evaluate and determine the right upper quadrant pain in face of a cholelithiasis.  For the rapid heart rate patient was given Lopressor IV and for his elevated blood pressure he was given clonidine because he is normally on that.  Gust with hospitalist who will admit to stepdown continue cardiac monitoring.  We will continue blood pressure control and heart rate control.  Did not feel that patient needed to be started on diltiazem drip.  Patient had no chest  pain associated with this.  Patient will also need some blood sugar control.  But since blood sugar was less than 300 no insulin  provided.  Final Clinical Impressions(s) / ED Diagnoses   Final diagnoses:  Ventricular tachycardia (HCC)  Atrial fibrillation with RVR (HCC)  Calculus of gallbladder without cholecystitis without obstruction    ED Discharge Orders    None       Vanetta Mulders, MD 07/28/18 2115

## 2018-07-28 NOTE — ED Notes (Signed)
Pt sitting up in bed, vtach on monitor.  PT asymptomatic.  EDP at bedside.  Lopressor ordered and administered.  VSS at this time.

## 2018-07-28 NOTE — ED Notes (Signed)
CRITICAL VALUE ALERT  Critical Value:  Troponin 0.05  Date & Time Notied:  07/28/18 2343  Provider Notified: [email or web address]  Orders Received/Actions taken: md notified

## 2018-07-28 NOTE — H&P (Signed)
History and Physical    Russell Morrow NLG:[id number] DOB: 1950/06/24 DOA: 07/28/2018  PCP: Benita Stabile, MD   Patient coming from: Home.  I have personally briefly reviewed patient's old medical records in [facility]  Chief Complaint: Abdominal pain, nausea and vomiting.  HPI: Russell Morrow is a 68 y.o. male with medical history significant of type 2 diabetes, diverticulosis, diverticulitis, hypertension, CAD, history of MI, history of cholelithiasis who is coming to the emergency department with complaints of abdominal pain since yesterday evening after he ate a cheeseburger with steak fries.  Since then he has had frequent dry heaving with several episodes of emesis.  He denies fever, chills, hematemesis, diarrhea, constipation, melena or hematochezia.  No dysuria, frequency or hematuria.  No chest pain, dizziness, diaphoresis, PND, orthopnea, recent lower extremity pitting edema, but gets occasional palpitations.  No polyuria, polydipsia, polyphagia or blurred vision.  No pruritus.  ED Course: 97.8 F, pulse 88, respirations 20, blood pressure 173/105 mmHg and O2 sat 98% on room air.  While in the ER, the patient had NSVT, followed by atrial fibrillation with RVR.  This responded to IV metoprolol.  He was also given clonidine 0.2 mg p.o. x1 dose along with a 1000 mL NS bolus, Zofran 4 mg IVP x1 dose and 2 doses of hydromorphone 1 mg IVP.  I added famotidine 20 mg IVPB and magnesium sulfate 2 g IVPB.  His urinalysis shows increase in a specific gravity, glucosuria of 500, ketonuria of 80 and proteinuria 100 mg/dL.  There was small hemoglobinuria.  Microscopic examination is normal.  White count was 14.3, hemoglobin 16.5 g/dL and platelets 081.  CMP shows a potassium of 3.3 mmol/L and a blood glucose of 243 mg/dL.  All other values are within normal limits.  Lipase level was normal.  Imaging: CT abdomen did not show any acute intra-abdominal or intrapelvic pathology.  However, there was colonic  diverticulosis, renal hypodensities and again cholelithiasis was seen in the gallbladder.  Please see images and full radiology report for further detail.  Review of Systems: As per HPI otherwise 10 point review of systems negative.   Past Medical History:  Diagnosis Date  . Diabetes mellitus without complication (HCC)   . Diverticulitis   . Hypertension   . Myocardial infarct [facility])     Past Surgical History:  Procedure Laterality Date  . CERVICAL FUSION    . nasal septum repair       reports that he has never smoked. He has never used smokeless tobacco. He reports current alcohol use of about 14.0 standard drinks of alcohol per week. He reports current drug use. Drug: Marijuana.  Allergies  Allergen Reactions  . Statins     Muscle aches, liver pain    Family History  Problem Relation Age of Onset  . Cancer Mother        breast  . Hypertension Mother   . Diabetes Father   . Heart disease Father   . Hypertension Father   . Diabetes Brother   . Heart disease Brother   . Hypertension Brother   . Colon cancer Neg Hx    Prior to Admission medications   Medication Sig Start Date End Date Taking? Authorizing Provider  acetaminophen (TYLENOL) 325 MG tablet Take 650 mg by mouth every 6 (six) hours as needed for mild pain or moderate pain.   Yes [provider]  cloNIDine (CATAPRES) 0.2 MG tablet Take 0.2 mg by mouth 2 (two) times  daily.   Yes [provider]  diphenhydrAMINE (BENADRYL) 25 mg capsule Take 25 mg by mouth at bedtime.   Yes [provider]  empagliflozin (JARDIANCE) 10 MG TABS tablet Take 10 mg by mouth daily.   Yes [provider]  Esomeprazole Magnesium 20 MG TBEC Take 20 mg by mouth 2 (two) times daily before a meal.    Yes [provider]  insulin glargine (LANTUS) 100 UNIT/ML injection Inject 28 Units into the skin at bedtime.    Yes [provider]  Liraglutide (VICTOZA [place]) Inject 1.8 mLs into the skin at  bedtime.    Yes [provider]  metFORMIN (GLUCOPHAGE) 1000 MG tablet Take 1,000 mg by mouth 2 (two) times daily with a meal.   Yes [provider]  metoprolol succinate (TOPROL-XL) 25 MG 24 hr tablet Take 25 mg daily by mouth. 03/24/17  Yes [provider]  Olmesartan-amLODIPine-HCTZ 40-5-25 MG TABS Take 1 tablet by mouth daily. 07/24/18  Yes [provider]  potassium chloride SA (K-DUR,KLOR-CON) 20 MEQ tablet Take 2 tablets (40 mEq total) by mouth daily. Patient taking differently: Take 20 mEq by mouth daily.  12/31/16  Yes [name], MD  rOPINIRole (REQUIP) 1 MG tablet Take 2 mg by mouth at bedtime. 07/07/18  Yes [provider]  temazepam (RESTORIL) 15 MG capsule Take 15 mg by mouth at bedtime. 07/07/18  Yes [provider]    Physical Exam: Vitals:   07/28/18 1926 07/28/18 1930 07/28/18 2000 07/28/18 2036  BP: (!) 171/121 (!) 189/121 (!) 194/126 139/90  Pulse: 97 (!) 103 (!) 129 (!) 30  Resp:  (!) 24 14 (!) 23  Temp:      TempSrc:      SpO2:  96% 96% 97%  Weight:      Height:        Constitutional: NAD, calm, comfortable Eyes: PERRL, lids and conjunctivae normal ENMT: Mucous membranes are mildly dry. Posterior pharynx clear of any exudate or lesions. Neck: normal, supple, no masses, no thyromegaly Respiratory: clear to auscultation bilaterally, no wheezing, no crackles. Normal respiratory effort. No accessory muscle use.  Cardiovascular: Irregularly irregular, no murmurs / rubs / gallops. No extremity edema. 2+ pedal pulses. No carotid bruits.  Abdomen: Obese, nondistended.  Soft, no tenderness, no masses palpated. No hepatosplenomegaly. Bowel sounds positive.  Musculoskeletal: no clubbing / cyanosis. . Good ROM, no contractures. Normal muscle tone.  Skin: no gross rashes, lesions, ulcers on limited dermatological examination. Neurologic: CN 2-12 grossly intact. Sensation intact, DTR normal. Strength 5/5 in all 4.   Psychiatric: Normal judgment and insight. Alert and oriented x 3. Normal mood.   Labs on Admission: I have personally reviewed following labs and imaging studies  CBC: Recent Labs  Lab 07/28/18 1252  WBC 14.3*  HGB 16.5  HCT 49.2  MCV 83.5  PLT 256   Basic Metabolic Panel: Recent Labs  Lab 07/28/18 1252  NA 139  K 3.3*  CL 99  CO2 24  GLUCOSE 243*  BUN 13  CREATININE 0.84  CALCIUM 9.5  MG 2.0  PHOS 2.2*   GFR: Estimated Creatinine Clearance: 105.9 mL/min (by C-G formula based on SCr of 0.84 mg/dL). Liver Function Tests: Recent Labs  Lab 07/28/18 1252  AST 19  ALT 26  ALKPHOS 53  BILITOT 1.0  PROT 8.0  ALBUMIN 4.7   Recent Labs  Lab 07/28/18 1252  LIPASE 32   No results for input(s): AMMONIA in the last 168 hours.  Coagulation Profile: No results for input(s): INR, PROTIME in the last 168 hours. Cardiac Enzymes: No results for input(s): CKTOTAL, CKMB, CKMBINDEX, TROPONINI in the last 168 hours. BNP (last 3 results) No results for input(s): PROBNP in the last 8760 hours. HbA1C: No results for input(s): HGBA1C in the last 72 hours. CBG: No results for input(s): GLUCAP in the last 168 hours. Lipid Profile: No results for input(s): CHOL, HDL, LDLCALC, TRIG, CHOLHDL, LDLDIRECT in the last 72 hours. Thyroid Function Tests: No results for input(s): TSH, T4TOTAL, FREET4, T3FREE, THYROIDAB in the last 72 hours. Anemia Panel: No results for input(s): VITAMINB12, FOLATE, FERRITIN, TIBC, IRON, RETICCTPCT in the last 72 hours. Urine analysis:    Component Value Date/Time   COLORURINE YELLOW 07/28/2018 1559   APPEARANCEUR CLEAR 07/28/2018 1559   LABSPEC 1.031 (H) 07/28/2018 1559   PHURINE 5.0 07/28/2018 1559   GLUCOSEU >=500 (A) 07/28/2018 1559   HGBUR SMALL (A) 07/28/2018 1559   BILIRUBINUR NEGATIVE 07/28/2018 1559   KETONESUR 80 (A) 07/28/2018 1559   PROTEINUR 100 (A) 07/28/2018 1559   NITRITE NEGATIVE 07/28/2018 1559   LEUKOCYTESUR NEGATIVE 07/28/2018  1559    Radiological Exams on Admission: Ct Abdomen Pelvis W Contrast  Result Date: 07/28/2018 CLINICAL DATA:  Upper abdominal pain with nausea and vomiting EXAM: CT ABDOMEN AND PELVIS WITH CONTRAST TECHNIQUE: Multidetector CT imaging of the abdomen and pelvis was performed using the standard protocol following bolus administration of intravenous contrast. CONTRAST:  100mL OMNIPAQUE IOHEXOL 300 MG/ML  SOLN COMPARISON:  CT 02/16/2017 FINDINGS: Lower chest: Lung bases demonstrate subsegmental atelectasis at the right base. No pleural effusion. The heart size is within normal limits. Hepatobiliary: Small stone in the gallbladder. No focal hepatic abnormality or biliary dilatation Pancreas: Unremarkable. No pancreatic ductal dilatation or surrounding inflammatory changes. Spleen: Normal in size without focal abnormality. Adrenals/Urinary Tract: Adrenal glands are normal. No hydronephrosis. Subcentimeter hypodensities within the kidneys, too small to further characterize. 17 mm intermediate density lesion mid right kidney. 12 mm intermediate density medial midpole left kidney. 14 mm intermediate density midpole posterior left kidney. Bladder is normal. Stomach/Bowel: Stomach is within normal limits. Appendix appears normal. No evidence of bowel wall thickening, distention, or inflammatory changes. Sigmoid colon diverticular disease without acute inflammatory process. Vascular/Lymphatic: Mild aortic atherosclerosis. No aneurysm. No significantly enlarged lymph nodes. Reproductive: Prostate is unremarkable. Other: Fat within the left greater than right inguinal canal. No free air or free fluid Musculoskeletal: No acute or suspicious abnormality. Degenerative change most prominent at L4-L5. IMPRESSION: 1. No CT evidence for acute intra-abdominal or pelvic abnormality. 2. Indeterminate bilateral small renal lesions. Suggest nonemergent MRI for further evaluation. 3. Small stone in the gallbladder 4. Sigmoid colon  diverticular disease without acute inflammatory change Electronically Signed   By: Jasmine PangKim  Fujinaga M.D.   On: 07/28/2018 19:31    EKG: Independently reviewed. Vent. rate 90 BPM PR interval * ms QRS duration 91 ms QT/QTc 352/431 ms P-R-T axes 10 -86 102 Sinus tachycardia Multiform ventricular premature complexes Left anterior fascicular block Abnormal R-wave progression, early transition Nonspecific repol abnormality, diffuse leads There are no significant electrocardiographic changes from previous tracing.  Assessment/Plan Principal Problem:   Atrial fibrillation with RVR (HCC) Likely due to stress and vomiting induced clonidine/beta-blocker decreased bioavailability. Observation/stepdown. Continue supplemental oxygen.Continue IV fluids. Optimize electrolytes.  Trend troponin level.  Cardiology evaluation and Echocardiogram in a.m.  Active Problems:   Cholelithiasis Keep n.p.o. for now. Analgesics as needed. Antiemetics as needed. General surgery evaluation in a.m.  Leukocytosis No fever or chills. Work-up has been benign. Defer antibiotic therapy for now. Follow-up WBC in a.m.    Hypertension Continue clonidine 0.2 mg p.o. twice daily. Continue amlodipine 5 mg p.o. daily. Continue losartan 40 mg p.o. daily. Continue hydrochlorothiazide 25 mg p.o. daily. Continue metoprolol 25 mg p.o. daily.    Hypokalemia Placement in order. Follow-up potassium level.    Hypophosphatemia Replacement ordered. Follow-up phosphorus level as needed.    Renal lesions Should obtain nonemergent MRI of abdomen.    Type 2 diabetes mellitus (HCC) Currently n.p.o. Hold metformin and Jardiance. Resume once back on oral intake. Continue Lantus 28 units at bedtime. Continue Victoza 1.2 mg SQ at bedtime.   DVT prophylaxis: SQ Lovenox. Code Status: Full code. Family Communication:  Disposition Plan: Observation for rate control, cardiology and general surgery evaluation. Consults  called: Routine general surgery evaluation. Admission status: Observation/stepdown.   Bobette Mo MD [facility]  07/28/2018, 9:19 PM

## 2018-07-29 ENCOUNTER — Other Ambulatory Visit: Payer: Self-pay

## 2018-07-29 ENCOUNTER — Encounter (HOSPITAL_COMMUNITY): Payer: Self-pay | Admitting: Student

## 2018-07-29 ENCOUNTER — Observation Stay (HOSPITAL_BASED_OUTPATIENT_CLINIC_OR_DEPARTMENT_OTHER): Payer: Medicare Other

## 2018-07-29 DIAGNOSIS — I2510 Atherosclerotic heart disease of native coronary artery without angina pectoris: Secondary | ICD-10-CM | POA: Diagnosis not present

## 2018-07-29 DIAGNOSIS — R079 Chest pain, unspecified: Secondary | ICD-10-CM | POA: Diagnosis not present

## 2018-07-29 DIAGNOSIS — K8020 Calculus of gallbladder without cholecystitis without obstruction: Secondary | ICD-10-CM

## 2018-07-29 DIAGNOSIS — I471 Supraventricular tachycardia: Secondary | ICD-10-CM

## 2018-07-29 DIAGNOSIS — I4891 Unspecified atrial fibrillation: Secondary | ICD-10-CM | POA: Diagnosis not present

## 2018-07-29 DIAGNOSIS — R7989 Other specified abnormal findings of blood chemistry: Secondary | ICD-10-CM

## 2018-07-29 LAB — CBC WITH DIFFERENTIAL/PLATELET
Abs Immature Granulocytes: 0.06 10*3/uL (ref 0.00–0.07)
BASOS ABS: 0 10*3/uL (ref 0.0–0.1)
Basophils Relative: 0 %
Eosinophils Absolute: 0 10*3/uL (ref 0.0–0.5)
Eosinophils Relative: 0 %
HCT: 46 % (ref 39.0–52.0)
Hemoglobin: 14.9 g/dL (ref 13.0–17.0)
Immature Granulocytes: 0 %
Lymphocytes Relative: 13 %
Lymphs Abs: 1.8 10*3/uL (ref 0.7–4.0)
MCH: 27.9 pg (ref 26.0–34.0)
MCHC: 32.4 g/dL (ref 30.0–36.0)
MCV: 86 fL (ref 80.0–100.0)
Monocytes Absolute: 1.3 10*3/uL — ABNORMAL HIGH (ref 0.1–1.0)
Monocytes Relative: 10 %
NEUTROS ABS: 10.2 10*3/uL — AB (ref 1.7–7.7)
Neutrophils Relative %: 77 %
PLATELETS: 236 10*3/uL (ref 150–400)
RBC: 5.35 MIL/uL (ref 4.22–5.81)
RDW: 13.9 % (ref 11.5–15.5)
WBC: 13.4 10*3/uL — ABNORMAL HIGH (ref 4.0–10.5)
nRBC: 0 % (ref 0.0–0.2)

## 2018-07-29 LAB — COMPREHENSIVE METABOLIC PANEL
ALT: 23 U/L (ref 0–44)
AST: 14 U/L — ABNORMAL LOW (ref 15–41)
Albumin: 4 g/dL (ref 3.5–5.0)
Alkaline Phosphatase: 46 U/L (ref 38–126)
Anion gap: 10 (ref 5–15)
BUN: 16 mg/dL (ref 8–23)
CHLORIDE: 102 mmol/L (ref 98–111)
CO2: 25 mmol/L (ref 22–32)
Calcium: 8.5 mg/dL — ABNORMAL LOW (ref 8.9–10.3)
Creatinine, Ser: 0.8 mg/dL (ref 0.61–1.24)
GFR calc Af Amer: >60 mL/min (ref >60)
GFR calc non Af Amer: >60 mL/min (ref >60)
GLUCOSE: 150 mg/dL — AB (ref 70–99)
Potassium: 3.6 mmol/L (ref 3.5–5.1)
Sodium: 137 mmol/L (ref 135–145)
Total Bilirubin: 0.7 mg/dL (ref 0.3–1.2)
Total Protein: 7.2 g/dL (ref 6.5–8.1)

## 2018-07-29 LAB — GLUCOSE, CAPILLARY
Glucose-Capillary: 184 mg/dL — ABNORMAL HIGH (ref 70–99)
Glucose-Capillary: 170 mg/dL — ABNORMAL HIGH (ref 70–99)
Glucose-Capillary: 157 mg/dL — ABNORMAL HIGH (ref 70–99)
Glucose-Capillary: 163 mg/dL — ABNORMAL HIGH (ref 70–99)

## 2018-07-29 LAB — ECHOCARDIOGRAM COMPLETE
Height: 73 in
WEIGHTICAEL: 3509.72 [oz_av]

## 2018-07-29 LAB — TSH: TSH: 0.589 u[IU]/mL (ref 0.350–4.500)

## 2018-07-29 LAB — TROPONIN I
Troponin I: 0.13 ng/mL (ref <0.03)
Troponin I: 0.09 ng/mL (ref <0.03)

## 2018-07-29 LAB — MRSA PCR SCREENING: MRSA by PCR: NEGATIVE

## 2018-07-29 MED ORDER — AMLODIPINE BESYLATE 5 MG PO TABS
5.0000 mg | ORAL_TABLET | Freq: Every day | ORAL | Status: DC
  Administered 2018-07-29 – 2018-07-30 (×2): 5 mg via ORAL
  Filled 2018-07-29 (×2): qty 1

## 2018-07-29 MED ORDER — METOPROLOL SUCCINATE ER 50 MG PO TB24
50.0000 mg | ORAL_TABLET | Freq: Every day | ORAL | Status: DC
  Administered 2018-07-30: 50 mg via ORAL
  Filled 2018-07-29: qty 1

## 2018-07-29 MED ORDER — METOPROLOL SUCCINATE ER 25 MG PO TB24
25.0000 mg | ORAL_TABLET | Freq: Once | ORAL | Status: AC
  Administered 2018-07-29: 25 mg via ORAL
  Filled 2018-07-29: qty 1

## 2018-07-29 MED ORDER — ADULT MULTIVITAMIN W/MINERALS CH
1.0000 | ORAL_TABLET | Freq: Every day | ORAL | Status: DC
  Administered 2018-07-29 – 2018-07-30 (×2): 1 via ORAL
  Filled 2018-07-29 (×2): qty 1

## 2018-07-29 MED ORDER — POTASSIUM CHLORIDE IN NACL 20-0.9 MEQ/L-% IV SOLN: INTRAVENOUS | Status: AC

## 2018-07-29 MED ORDER — LORAZEPAM 1 MG PO TABS: 1.0000 mg | ORAL_TABLET | Freq: Four times a day (QID) | ORAL | Status: DC | PRN

## 2018-07-29 MED ORDER — ASPIRIN EC 81 MG PO TBEC
81.0000 mg | DELAYED_RELEASE_TABLET | Freq: Every day | ORAL | Status: DC
  Administered 2018-07-29 – 2018-07-30 (×2): 81 mg via ORAL
  Filled 2018-07-29 (×2): qty 1

## 2018-07-29 MED ORDER — LORAZEPAM 2 MG/ML IJ SOLN: 1.0000 mg | Freq: Four times a day (QID) | INTRAMUSCULAR | Status: DC | PRN

## 2018-07-29 MED ORDER — IRBESARTAN 300 MG PO TABS
300.0000 mg | ORAL_TABLET | Freq: Every day | ORAL | Status: DC
  Administered 2018-07-29 – 2018-07-30 (×2): 300 mg via ORAL
  Filled 2018-07-29 (×4): qty 1

## 2018-07-29 MED ORDER — HYDROMORPHONE HCL 1 MG/ML IJ SOLN: 1.0000 mg | INTRAMUSCULAR | Status: DC | PRN

## 2018-07-29 MED ORDER — THIAMINE HCL 100 MG/ML IJ SOLN
100.0000 mg | Freq: Every day | INTRAMUSCULAR | Status: DC
  Filled 2018-07-29: qty 2

## 2018-07-29 MED ORDER — HYDROCHLOROTHIAZIDE 25 MG PO TABS
25.0000 mg | ORAL_TABLET | Freq: Every day | ORAL | Status: DC
  Administered 2018-07-29 – 2018-07-30 (×2): 25 mg via ORAL
  Filled 2018-07-29 (×2): qty 1

## 2018-07-29 MED ORDER — TEMAZEPAM 15 MG PO CAPS
15.0000 mg | ORAL_CAPSULE | Freq: Every day | ORAL | Status: DC
  Administered 2018-07-29: 15 mg via ORAL
  Filled 2018-07-29: qty 1

## 2018-07-29 MED ORDER — FOLIC ACID 1 MG PO TABS
1.0000 mg | ORAL_TABLET | Freq: Every day | ORAL | Status: DC
  Administered 2018-07-29 – 2018-07-30 (×2): 1 mg via ORAL
  Filled 2018-07-29 (×2): qty 1

## 2018-07-29 MED ORDER — VITAMIN B-1 100 MG PO TABS
100.0000 mg | ORAL_TABLET | Freq: Every day | ORAL | Status: DC
  Administered 2018-07-29 – 2018-07-30 (×2): 100 mg via ORAL
  Filled 2018-07-29 (×2): qty 1

## 2018-07-29 NOTE — Consult Note (Addendum)
Cardiology Consult    Patient ID: Russell Morrow; [id number]; 1951/03/22   Admit date: 07/28/2018 Date of Consult: 07/29/2018  Primary Care Provider: Celene Squibb, MD Primary Cardiologist: Jenkins Rouge, MD   Patient Profile    Russell Morrow is a 68 y.o. male with past medical history of CAD (mild nonobstructive CAD by cath in 2010 with NST in 11/2015 showing a fixed defect with no ischemia and Coronary CT showing nonobstructive CAD along LAD and LCx), HTN, Type 2 DM, and family history of CAD who is being seen today for the evaluation of chest pain and questionable atrial fibrillation at the request of Dr. Olevia Bowens.   History of Present Illness    Russell Morrow was last examined by Dr. Johnsie Cancel in 01/2017 and denied any recent chest pain or dyspnea on exertion. Reported a history of PAC's with occasional palpitations. Was not on BB therapy as he was on these in the past and did not tolerate well. Had been intolerant to multiple statins and was started on Zetia 38m daily.   [facility] evaluation of abdominal pain, nausea, and vomiting. In talking with the patient and his fiance, he reports being in his usual state of health until last week when he developed nausea and left upper quadrant abdominal pain. This was not associated with activity or food consumption. Starting yesterday, his discomfort worsened and radiated into his sternum which prompted him to come to the ED for further evaluation. Symptoms persisted for several hours.   He denies any recent exertional chest pain or dyspnea on exertion. No recent orthopnea, PND, or lower extremity edema. He does experience frequent palpitations and has a history of PAC's, PVC's, and atrial tachycardia by his report. Has been on Toprol-XL 224mdaily as an outpatient.   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 Says he has a glass of wine on most nights. No  tobacco use but does use marijuana daily. He is getting married in 9 days.   Initial labs show WBC 14.3, Hgb 16.5, platelets 256, Na+ 139, K+ 3.3, and creatinine 0.84. AST 19, ALT 26, and Alk Phos 53. Mg 2.0. Lipase 32. Initial troponin 0.05 with repeat value of 0.13.CT Abdomen showed no evidence for acute intra-abdominal or pelvic abnormality. Was noted to have indeterminate bilateral small renal lesions, cholelithiasis and sigmoid colon diverticular disease without acute inflammatory changes. Initial EKG showed NSR, HR 90, with frequent PVC's and slight ST depression along lateral leads which is new as compared to tracings from 2018. He did have occasional episodes of atrial tachycardia on telemetry but thus far I do not seem any strips consistent with atrial fibrillation. Frequent PVC's and PAC's noted with PVC's sometimes occurring in a couplet pattern. EKG at 1811 last night showed a comupter generated read of atrial fibrillation but he has distinct p-waves at the beginning and end of the strip with a brief atrial tach captured at that time.    Past Medical History:  Diagnosis Date  . CAD (coronary artery disease)    a. mild nonobstructive CAD by cath in 2010 b. NST in 11/2015 showing a fixed defect with no ischemia and Coronary CT showing nonobstructive CAD along LAD and LCx  . Diabetes mellitus without complication ([place]  . Diverticulitis   . Hypertension     Past Surgical History:  Procedure Laterality Date  . CERVICAL FUSION    .  nasal septum repair       Home Medications:  Prior to Admission medications   Medication Sig Start Date End Date Taking? Authorizing Provider  acetaminophen (TYLENOL) 325 MG tablet Take 650 mg by mouth every 6 (six) hours as needed for mild pain or moderate pain.   Yes [provider]  cloNIDine (CATAPRES) 0.2 MG tablet Take 0.2 mg by mouth 2 (two) times daily.   Yes [provider]  diphenhydrAMINE (BENADRYL) 25 mg capsule Take 25 mg by  mouth at bedtime.   Yes [provider]  empagliflozin (JARDIANCE) 10 MG TABS tablet Take 10 mg by mouth daily.   Yes [provider]  Esomeprazole Magnesium 20 MG TBEC Take 20 mg by mouth 2 (two) times daily before a meal.    Yes [provider]  insulin glargine (LANTUS) 100 UNIT/ML injection Inject 28 Units into the skin at bedtime.    Yes [provider]  Liraglutide (VICTOZA [place]) Inject 1.8 mLs into the skin at bedtime.    Yes [provider]  metFORMIN (GLUCOPHAGE) 1000 MG tablet Take 1,000 mg by mouth 2 (two) times daily with a meal.   Yes [provider]  metoprolol succinate (TOPROL-XL) 25 MG 24 hr tablet Take 25 mg daily by mouth. 03/24/17  Yes [provider]  Olmesartan-amLODIPine-HCTZ 40-5-25 MG TABS Take 1 tablet by mouth daily. 07/24/18  Yes [provider]  potassium chloride SA (K-DUR,KLOR-CON) 20 MEQ tablet Take 2 tablets (40 mEq total) by mouth daily. Patient taking differently: Take 20 mEq by mouth daily.  12/31/16  Yes Julianne Rice, MD  rOPINIRole (REQUIP) 1 MG tablet Take 2 mg by mouth at bedtime. 07/07/18  Yes [provider]  temazepam (RESTORIL) 15 MG capsule Take 15 mg by mouth at bedtime. 07/07/18  Yes [provider]    Inpatient Medications: Scheduled Meds: . amLODipine  5 mg Oral Daily  . cloNIDine  0.2 mg Oral BID  . enoxaparin (LOVENOX) injection  40 mg Subcutaneous Q24H  . hydrochlorothiazide  25 mg Oral Daily  . insulin aspart  0-15 Units Subcutaneous TID WC  . insulin glargine  28 Units Subcutaneous QHS  . irbesartan  300 mg Oral Daily  . metoprolol succinate  25 mg Oral Once  . [START ON 07/30/2018] metoprolol succinate  50 mg Oral Daily  . pantoprazole  40 mg Oral Daily  . phosphorus  500 mg Oral BID  . potassium chloride SA  40 mEq Oral Daily  . rOPINIRole  2 mg Oral QHS   Continuous Infusions: . 0.9 % NaCl with KCl 20 mEq / L 125 mL/hr at 07/29/18 0531   PRN  Meds: acetaminophen **OR** acetaminophen, HYDROmorphone (DILAUDID) injection, metoprolol tartrate, temazepam  Allergies:    Allergies  Allergen Reactions  . Statins     Muscle aches, liver pain    Social History:   Social History   Socioeconomic History  . Marital status: Significant Other    Spouse name: Not on file  . Number of children: Not on file  . Years of education: Not on file  . Highest education level: Not on file  Occupational History  . Not on file  Social Needs  . Financial resource strain: Not on file  . Food insecurity:    Worry: Not on file    Inability: Not on file  . Transportation needs:    Medical: Not on file    Non-medical: Not on file  Tobacco Use  .  Smoking status: Never Smoker  . Smokeless tobacco: Never Used  Substance and Sexual Activity  . Alcohol use: Yes    Alcohol/week: 14.0 standard drinks    Types: 14 Glasses of wine per week    Comment: wine usually daily  . Drug use: Yes    Types: Marijuana  . Sexual activity: Not on file  Lifestyle  . Physical activity:    Days per week: Not on file    Minutes per session: Not on file  . Stress: Not on file  Relationships  . Social connections:    Talks on phone: Not on file    Gets together: Not on file    Attends religious service: Not on file    Active member of club or organization: Not on file    Attends meetings of clubs or organizations: Not on file    Relationship status: Not on file  . Intimate partner violence:    Fear of current or ex partner: Not on file    Emotionally abused: Not on file    Physically abused: Not on file    Forced sexual activity: Not on file  Other Topics Concern  . Not on file  Social History Narrative  . Not on file     Family History:    Family History  Problem Relation Age of Onset  . Cancer Mother        breast  . Hypertension Mother   . Diabetes Father   . Heart disease Father   . Hypertension Father   . Diabetes Brother   . Heart  disease Brother   . Hypertension Brother   . Colon cancer Neg Hx       Review of Systems    General:  No chills, fever, night sweats or weight changes.  Cardiovascular:  No chest pain, dyspnea on exertion, edema, orthopnea, paroxysmal nocturnal dyspnea. Positive for palpitations.  Dermatological: No rash, lesions/masses Respiratory: No cough, dyspnea Urologic: No hematuria, dysuria Abdominal:   No diarrhea, bright red blood per rectum, melena, or hematemesis. Positive for nausea and vomiting.  Neurologic:  No visual changes, wkns, changes in mental status. All other systems reviewed and are otherwise negative except as noted above.  Physical Exam/Data    Vitals:   07/29/18 0730 07/29/18 0930 07/29/18 1000 07/29/18 1030  BP: (!) 184/89 (!) 164/101 (!) 181/91 (!) 151/88  Pulse: 93 91 97 83  Resp: 16 20 (!) 21 (!) 23  Temp:  98.9 F (37.2 C)    TempSrc:  Oral    SpO2: 96% 98% 99% 96%  Weight:  99.5 kg    Height:  '6\' 1"'  (1.854 m)      Intake/Output Summary (Last 24 hours) at 07/29/2018 1048 Last data filed at 07/29/2018 1000 Gross per 24 hour  Intake 803.06 ml  Output -  Net 803.06 ml   Filed Weights   07/28/18 1221 07/29/18 0930  Weight: 102.1 kg 99.5 kg   Body mass index is 28.94 kg/m.   General: Pleasant, Caucasian male appearing in NAD Psych: Normal affect. Neuro: Alert and oriented X 3. Moves all extremities spontaneously. HEENT: Normal  Neck: Supple without bruits or JVD. Lungs:  Resp regular and unlabored, CTA without wheezing or rales. Heart: RRR with occasional ectopic beats. No s3, s4, or murmurs. Abdomen: Soft, non-tender, non-distended, BS + x 4.  Extremities: No clubbing, cyanosis or edema. DP/PT/Radials 2+ and equal bilaterally.   Labs/Studies     Relevant CV Studies:  Echocardiogram:  11/2015   NST: 11/2015    Laboratory Data:  Chemistry Recent Labs  Lab 07/28/18 1252 07/29/18 0539  NA 139 137  K 3.3* 3.6  CL 99 102  CO2 24 25   GLUCOSE 243* 150*  BUN 13 16  CREATININE 0.84 0.80  CALCIUM 9.5 8.5*  GFRNONAA >60 >60  GFRAA >60 >60  ANIONGAP 16* 10    Recent Labs  Lab 07/28/18 1252 07/29/18 0539  PROT 8.0 7.2  ALBUMIN 4.7 4.0  AST 19 14*  ALT 26 23  ALKPHOS 53 46  BILITOT 1.0 0.7   Hematology Recent Labs  Lab 07/28/18 1252 07/29/18 0539  WBC 14.3* 13.4*  RBC 5.89* 5.35  HGB 16.5 14.9  HCT 49.2 46.0  MCV 83.5 86.0  MCH 28.0 27.9  MCHC 33.5 32.4  RDW 13.9 13.9  PLT 256 236   Cardiac Enzymes Recent Labs  Lab 07/28/18 2121 07/29/18 0539  TROPONINI 0.05* 0.13*   No results for input(s): TROPIPOC in the last 168 hours.  BNPNo results for input(s): BNP, PROBNP in the last 168 hours.  DDimer No results for input(s): DDIMER in the last 168 hours.  Radiology/Studies:  Ct Abdomen Pelvis W Contrast  Result Date: 07/28/2018 CLINICAL DATA:  Upper abdominal pain with nausea and vomiting EXAM: CT ABDOMEN AND PELVIS WITH CONTRAST TECHNIQUE: Multidetector CT imaging of the abdomen and pelvis was performed using the standard protocol following bolus administration of intravenous contrast. CONTRAST:  172m OMNIPAQUE IOHEXOL 300 MG/ML  SOLN COMPARISON:  CT 02/16/2017 FINDINGS: Lower chest: Lung bases demonstrate subsegmental atelectasis at the right base. No pleural effusion. The heart size is within normal limits. Hepatobiliary: Small stone in the gallbladder. No focal hepatic abnormality or biliary dilatation Pancreas: Unremarkable. No pancreatic ductal dilatation or surrounding inflammatory changes. Spleen: Normal in size without focal abnormality. Adrenals/Urinary Tract: Adrenal glands are normal. No hydronephrosis. Subcentimeter hypodensities within the kidneys, too small to further characterize. 17 mm intermediate density lesion mid right kidney. 12 mm intermediate density medial midpole left kidney. 14 mm intermediate density midpole posterior left kidney. Bladder is normal. Stomach/Bowel: Stomach is within  normal limits. Appendix appears normal. No evidence of bowel wall thickening, distention, or inflammatory changes. Sigmoid colon diverticular disease without acute inflammatory process. Vascular/Lymphatic: Mild aortic atherosclerosis. No aneurysm. No significantly enlarged lymph nodes. Reproductive: Prostate is unremarkable. Other: Fat within the left greater than right inguinal canal. No free air or free fluid Musculoskeletal: No acute or suspicious abnormality. Degenerative change most prominent at L4-L5. IMPRESSION: 1. No CT evidence for acute intra-abdominal or pelvic abnormality. 2. Indeterminate bilateral small renal lesions. Suggest nonemergent MRI for further evaluation. 3. Small stone in the gallbladder 4. Sigmoid colon diverticular disease without acute inflammatory change Electronically Signed   By: KDonavan FoilM.D.   On: 07/28/2018 19:31     Assessment & Plan    1. Atrial Tachycardia/ Frequent Ectopy - he reports a history of frequent PAC's, PVC's, and Atrial Tachycardia. He has been noted to have PAC's and PVC's on telemetry with occasional PVC's occurring in a couplet pattern. Also noted to have a run of atrial tachycardia which was captured on EKG. I am unable to review all of his telemetry from the ED as this is not available in the system since he was moved to the ICU. There was concern for VT along with atrial fibrillation but he has not had any recurrence while in the ICU. Will see if strips from overnight are available.  - K+  3.3 and Mg 2.0. Will check TSH. Echocardiogram is pending to assess LV function and wall motion.  - he was on Toprol-XL 61m daily PTA. Will titrate to 572mdaily given his frequent ectopy.   ADDENDUM: Was able to locate telemetry from the ED which demonstrated a sustained wide-complex tachycardia from 1810 to 1812. By review of MAR, IV Lopressor was not administered until 1827. Will review with Dr. BrHarl Bowieut would anticipate a catheterization for definitive  evaluation and likely EP evaluation if felt to represent VT.  2. Elevated Troponin Values/ CAD - noted to have mild nonobstructive CAD by cath in 2010 with NST in 11/2015 showing a fixed defect with no ischemia and Coronary CT showing nonobstructive CAD along LAD and LCx.  - enzymes have been flat at 0.05 and 0.13 thus far this admission. Would continue to follow until enzymes peak. If trend remains flat, most consistent with demand ischemia in the setting of his arrhythmia.  - his pain overall seems atypical for a cardiac etiology but he does have risk factors and known CAD. If enzymes flat, could consider a stress test tomorrow morning. If significant uptrend or echo with abnormalities, would be more appropriate for a catheterization.  - continue BB therapy. Will start ASA 8129maily. If enzymes trend upwards, would need to start Heparin.  3. HTN - BP has been variable at 119/65 - 194/126 since admission. At 151/88 on most recent check.  - he is on Clonidine 0.2 mg BID, Toprol-XL 71m31mily, and Olmesartan-Amlodipine-HCTZ 40-5-71mg. Will plan to titrate Toprol-XL as outlined above.   4. HLD - has been intolerant to multiple statins and is no longer on Zetia.   5. Type 2 DM - followed by PCP. On Jardiance, Lantus, Victoza, and Metformin as an outpatient.    For questions or updates, please contact [facility] consult [email or web address] for contact info under Cardiology/STEMI.  Signed, BritErma Heritage-C 07/29/2018, 10:48 AM Pager: [phone] seen and discussed with PA StraAhmed Primaagree with her documentation above. 67 y52male history of HTN, DM, mild CAD by cath and prior cardiac CT admitted with abdominal pain. Cardiology consulted for runs of tachycaria in ER, primarily SVT but also run of wide complex tachycardia  Myovue :  11/22/15 Moderate fixed inferior wall defect EF 45% no ischemic ECG changes Ex 6:39 minutes   11/2015 cardiac CT: calcium score 81 46th  percentile  nonobstructive disease in LAD/Circumflex  Aorta 4.0 cm    Mg 2 WBC 14.3 Hgb 16.5 Plt 256 K 3.3 Cr 0.84 TSH pending Trop 0.05-->0.13-->0.09 Echo pending EKG overall show SR and wandering atrial pacemaker. Run of atach with rate related ST depressions.   Tele review shows several long runs of atach. Had one 1 min run of a wide complex tach, can see as slows down just slightly the complex narrows and he is in atach, appears to be SVT with aberrancy as opposed to VT. Will run strips over with EP.   Restart his home beta locker, normalize his electrolytes. F/u echo, suspect his elevated trop is just rate related and is trending down. Arrythmias promoted by not being able to take his beta blocker at home, low K, and abdominal pain.    JonaCarlyle Dolly

## 2018-07-29 NOTE — ED Notes (Addendum)
Pt called RN to room and c/o mid chest pain, rates as 4/10, constant. Pt denies SOB, n/v, dizziness, lightheadedness. Pt reports he doesn't know if the pain is from where he is "hungry, or what". Pt reports the pain is higher up than previously reported. EKG performed. Dr. Mariea Clonts paged.

## 2018-07-29 NOTE — Consult Note (Signed)
Reason for Consult: Cholelithiasis Referring Physician: Dr. Iran OuchEmokpae  Russell Morrow is an 68 y.o. male.  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  Patient states that it occurred suddenly and he developed nausea.  During his work-up, he was found to have cholelithiasis.  He states he has a known history of cholelithiasis.  He did have an episode of biliary colic 1 year ago after a fatty meal.  Since that time, he has had no right upper quadrant abdominal pain, nausea, vomiting, fatty food intolerance, fever, chills, jaundice.  A cardiac work-up is pending.  He does have an elevated troponin.  He states he has no upper abdominal pain and feels that this is more cardiac in nature.  He used to work in the cardiac Cath Lab.  His pain is 4 out of 10 in the chest, but 0 out of 10 in the right upper quadrant.  Past Medical History:  Diagnosis Date  . Diabetes mellitus without complication (HCC)   . Diverticulitis   . Hypertension   . Myocardial infarct [facility](HCC)     Past Surgical History:  Procedure Laterality Date  . CERVICAL FUSION    . nasal septum repair      Family History  Problem Relation Age of Onset  . Cancer Mother        breast  . Hypertension Mother   . Diabetes Father   . Heart disease Father   . Hypertension Father   . Diabetes Brother   . Heart disease Brother   . Hypertension Brother   . Colon cancer Neg Hx     Social History:  reports that he has never smoked. He has never used smokeless tobacco. He reports current alcohol use of about 14.0 standard drinks of alcohol per week. He reports current drug use. Drug: Marijuana.  Allergies:  Allergies  Allergen Reactions  . Statins     Muscle aches, liver pain    Medications: I have reviewed the patient's current medications.  Results for orders placed or performed during the hospital encounter of 07/28/18 (from the past 48 hour(s))  Lipase, blood     Status:  None   Collection Time: 07/28/18 12:52 PM  Result Value Ref Range   Lipase 32 11 - 51 U/L    Comment: Performed at [facility], [address] [place], [place] [address]  Comprehensive metabolic panel     Status: Abnormal   Collection Time: 07/28/18 12:52 PM  Result Value Ref Range   Sodium 139 135 - 145 mmol/L   Potassium 3.3 (L) 3.5 - 5.1 mmol/L   Chloride 99 98 - 111 mmol/L   CO2 24 22 - 32 mmol/L   Glucose, Bld 243 (H) 70 - 99 mg/dL   BUN 13 8 - 23 mg/dL   Creatinine, Ser 1.910.84 0.61 - 1.24 mg/dL   Calcium 9.5 8.9 - 47.810.3 mg/dL   Total Protein 8.0 6.5 - 8.1 g/dL   Albumin 4.7 3.5 - 5.0 g/dL   AST 19 15 - 41 U/L   ALT 26 0 - 44 U/L   Alkaline Phosphatase 53 38 - 126 U/L   Total Bilirubin 1.0 0.3 - 1.2 mg/dL   GFR calc non Af Amer >60 >60 mL/min   GFR calc Af Amer >60 >60 mL/min   Anion gap 16 (H) 5 - 15    Comment: Performed at [facility], [address] [place], [place] [address]  CBC     Status: Abnormal   Collection Time: 07/28/18 12:52 PM  Result Value Ref Range   WBC 14.3 (H) 4.0 - 10.5 K/uL   RBC 5.89 (H) 4.22 - 5.81 MIL/uL   Hemoglobin 16.5 13.0 - 17.0 g/dL   HCT 82.9 93.7 - 16.9 %   MCV 83.5 80.0 - 100.0 fL   MCH 28.0 26.0 - 34.0 pg   MCHC 33.5 30.0 - 36.0 g/dL   RDW 67.8 93.8 - 10.1 %   Platelets 256 150 - 400 K/uL   nRBC 0.0 0.0 - 0.2 %    Comment: Performed at [facility], [address] [place], [place] [address]  Magnesium     Status: None   Collection Time: 07/28/18 12:52 PM  Result Value Ref Range   Magnesium 2.0 1.7 - 2.4 mg/dL    Comment: Performed at [facility], [address] [place], [place] [address]  Phosphorus     Status: Abnormal   Collection Time: 07/28/18 12:52 PM  Result Value Ref Range   Phosphorus 2.2 (L) 2.5 - 4.6 mg/dL    Comment: Performed at [facility], [address], [place], [place] [address]  Urinalysis, Routine w reflex microscopic     Status: Abnormal   Collection Time: 07/28/18  3:59 PM  Result Value Ref Range    Color, Urine YELLOW YELLOW   APPearance CLEAR CLEAR   Specific Gravity, Urine 1.031 (H) 1.005 - 1.030   pH 5.0 5.0 - 8.0   Glucose, UA >=500 (A) NEGATIVE mg/dL   Hgb urine dipstick SMALL (A) NEGATIVE   Bilirubin Urine NEGATIVE NEGATIVE   Ketones, ur 80 (A) NEGATIVE mg/dL   Protein, ur 353 (A) NEGATIVE mg/dL   Nitrite NEGATIVE NEGATIVE   Leukocytes,Ua NEGATIVE NEGATIVE   RBC / HPF 0-5 0 - 5 RBC/hpf   WBC, UA 0-5 0 - 5 WBC/hpf   Bacteria, UA NONE SEEN NONE SEEN    Comment: Performed at [facility], [address] [place], [place] [address]  Protime-INR     Status: None   Collection Time: 07/28/18  9:21 PM  Result Value Ref Range   Prothrombin Time 13.7 11.4 - 15.2 seconds   INR 1.06     Comment: Performed at [facility], [address], [place], [place] [address]  APTT     Status: None   Collection Time: 07/28/18  9:21 PM  Result Value Ref Range   aPTT 31 24 - 36 seconds    Comment: Performed at [facility], [address] [place], [place] [address]  Troponin I - Add-On to previous collection     Status: Abnormal   Collection Time: 07/28/18  9:21 PM  Result Value Ref Range   Troponin I 0.05 (HH) <0.03 ng/mL    Comment: CRITICAL RESULT CALLED TO, READ BACK BY AND VERIFIED WITH: B MYRICK,RN @2343  07/28/18 [facility] Performed at [facility], [address] [place], [place] [address]   CBG monitoring, ED     Status: Abnormal   Collection Time: 07/28/18  9:40 PM  Result Value Ref Range   Glucose-Capillary 248 (H) 70 - 99 mg/dL  CBG monitoring, ED     Status: Abnormal   Collection Time: 07/28/18 10:48 PM  Result Value Ref Range   Glucose-Capillary 269 (H) 70 - 99 mg/dL  CBC WITH DIFFERENTIAL     Status: Abnormal   Collection Time: 07/29/18  5:39 AM  Result Value Ref Range   WBC 13.4 (H) 4.0 -  10.5 K/uL   RBC 5.35 4.22 - 5.81 MIL/uL   Hemoglobin 14.9 13.0 - 17.0 g/dL   HCT 16.146.0 09.639.0 - 04.552.0 %   MCV 86.0 80.0 - 100.0 fL   MCH 27.9 26.0 - 34.0 pg   MCHC 32.4 30.0 - 36.0 g/dL    RDW 40.913.9 81.111.5 - 91.415.5 %   Platelets 236 150 - 400 K/uL   nRBC 0.0 0.0 - 0.2 %   Neutrophils Relative % 77 %   Neutro Abs 10.2 (H) 1.7 - 7.7 K/uL   Lymphocytes Relative 13 %   Lymphs Abs 1.8 0.7 - 4.0 K/uL   Monocytes Relative 10 %   Monocytes Absolute 1.3 (H) 0.1 - 1.0 K/uL   Eosinophils Relative 0 %   Eosinophils Absolute 0.0 0.0 - 0.5 K/uL   Basophils Relative 0 %   Basophils Absolute 0.0 0.0 - 0.1 K/uL   Immature Granulocytes 0 %   Abs Immature Granulocytes 0.06 0.00 - 0.07 K/uL    Comment: Performed at [facility], [address] [place], [place] [address]  Comprehensive metabolic panel     Status: Abnormal   Collection Time: 07/29/18  5:39 AM  Result Value Ref Range   Sodium 137 135 - 145 mmol/L   Potassium 3.6 3.5 - 5.1 mmol/L   Chloride 102 98 - 111 mmol/L   CO2 25 22 - 32 mmol/L   Glucose, Bld 150 (H) 70 - 99 mg/dL   BUN 16 8 - 23 mg/dL   Creatinine, Ser 6.210.80 0.61 - 1.24 mg/dL   Calcium 8.5 (L) 8.9 - 10.3 mg/dL   Total Protein 7.2 6.5 - 8.1 g/dL   Albumin 4.0 3.5 - 5.0 g/dL   AST 14 (L) 15 - 41 U/L   ALT 23 0 - 44 U/L   Alkaline Phosphatase 46 38 - 126 U/L   Total Bilirubin 0.7 0.3 - 1.2 mg/dL   GFR calc non Af Amer >60 >60 mL/min   GFR calc Af Amer >60 >60 mL/min   Anion gap 10 5 - 15    Comment: Performed at [facility], [address] [place], [place] [address]  Troponin I - Tomorrow AM 0500     Status: Abnormal   Collection Time: 07/29/18  5:39 AM  Result Value Ref Range   Troponin I 0.13 (HH) <0.03 ng/mL    Comment: CRITICAL RESULT CALLED TO, READ BACK BY AND VERIFIED WITH: TALBOTT,T AT 6:15AM ON 07/29/18 BY [facility],C Performed at [facility], [address] [place], [place] [address]     Ct Abdomen Pelvis W Contrast  Result Date: 07/28/2018 CLINICAL DATA:  Upper abdominal pain with nausea and vomiting EXAM: CT ABDOMEN AND PELVIS WITH CONTRAST TECHNIQUE: Multidetector CT imaging of the abdomen and pelvis was performed using the standard protocol  following bolus administration of intravenous contrast. CONTRAST:  100mL OMNIPAQUE IOHEXOL 300 MG/ML  SOLN COMPARISON:  CT 02/16/2017 FINDINGS: Lower chest: Lung bases demonstrate subsegmental atelectasis at the right base. No pleural effusion. The heart size is within normal limits. Hepatobiliary: Small stone in the gallbladder. No focal hepatic abnormality or biliary dilatation Pancreas: Unremarkable. No pancreatic ductal dilatation or surrounding inflammatory changes. Spleen: Normal in size without focal abnormality. Adrenals/Urinary Tract: Adrenal glands are normal. No hydronephrosis. Subcentimeter hypodensities within the kidneys, too small to further characterize. 17 mm intermediate density lesion mid right kidney. 12 mm intermediate density medial midpole left kidney. 14 mm intermediate density midpole posterior left kidney. Bladder is normal. Stomach/Bowel: Stomach is  within normal limits. Appendix appears normal. No evidence of bowel wall thickening, distention, or inflammatory changes. Sigmoid colon diverticular disease without acute inflammatory process. Vascular/Lymphatic: Mild aortic atherosclerosis. No aneurysm. No significantly enlarged lymph nodes. Reproductive: Prostate is unremarkable. Other: Fat within the left greater than right inguinal canal. No free air or free fluid Musculoskeletal: No acute or suspicious abnormality. Degenerative change most prominent at L4-L5. IMPRESSION: 1. No CT evidence for acute intra-abdominal or pelvic abnormality. 2. Indeterminate bilateral small renal lesions. Suggest nonemergent MRI for further evaluation. 3. Small stone in the gallbladder 4. Sigmoid colon diverticular disease without acute inflammatory change Electronically Signed   By: Jasmine Pang M.D.   On: 07/28/2018 19:31    ROS:  Pertinent items are noted in HPI.  Blood pressure (!) 181/91, pulse 97, temperature 98.9 F (37.2 C), temperature source Oral, resp. rate (!) 21, height 6\' 1"  (1.854 m),  weight 99.5 kg, SpO2 99 %. Physical Exam: Well-developed well-nourished white male no acute distress Head is normocephalic, atraumatic Eyes without scleral icterus Lungs clear to auscultation with good breath sounds bilaterally Heart examination reveals sinus tachycardia with PVCs on the monitor Abdomen is soft, nontender, nondistended.  No right upper quadrant abdominal pain, hepatosplenomegaly, masses, or rigidity are noted.  CT scan of the abdomen report reviewed.  Single gallstone seen.  No evidence of acute cholecystitis.  Assessment/Plan: Impression: Cholelithiasis, currently asymptomatic.  I doubt this is the source of the patient's chest pain.  He does have a mild leukocytosis.  His liver enzyme tests are unremarkable. Plan: No need for acute surgical intervention at this point for his cholelithiasis.  Patient understands this and agrees.  Cardiac work-up pending.  Franky Macho 07/29/2018, 10:21 AM

## 2018-07-29 NOTE — Care Management Obs Status (Signed)
MEDICARE OBSERVATION STATUS NOTIFICATION   Patient Details  Name: Russell Morrow MRN: [id number] Date of Birth: [birth year]   Medicare Observation Status Notification Given:  Yes    Malcolm Metro, RN 07/29/2018, 11:31 AM

## 2018-07-29 NOTE — ED Notes (Signed)
Pt aware to be npo after midnight

## 2018-07-29 NOTE — Progress Notes (Signed)
*  PRELIMINARY RESULTS* Echocardiogram 2D Echocardiogram has been performed.  Russell Morrow 07/29/2018, 12:38 PM

## 2018-07-29 NOTE — ED Notes (Signed)
Date and time results received: 07/29/18 0614 (use smartphrase ".now" to insert current time)  Test: troponin Critical Value: 0.13  Name of Provider Notified: Dr. Robb Matar notified at [phone]  Orders Received? Or Actions Taken?: no/na

## 2018-07-29 NOTE — Progress Notes (Signed)
Patient Demographics:    Russell Morrow, is a 68 y.o. male, DOB - [birth year], HDQ:[id number]  Admit date - 07/28/2018   Admitting Physician Bobette Mo, MD  Outpatient Primary MD for the patient is Benita Stabile, MD  LOS - 0   Chief Complaint  Patient presents with  . Abdominal Pain        Subjective:    Russell Morrow today has no fevers, no emesis,  No chest pain, significant other at bedside, patient requesting food  Assessment  & Plan :    Principal Problem:   Chest pain in adult Active Problems:   Hypertension   Cholelithiasis   Lesion of both native kidneys   Hypokalemia   Hypophosphatemia   Leukocytosis   Type 2 diabetes mellitus (HCC)   Atrial tachycardia (HCC)   Atrial tachycardia, paroxysmal [facility])  Brief summary 68 y.o. male with past medical history of CAD (mild nonobstructive CAD by cath in 2010 with NST in 11/2015 showing a fixed defect with no ischemia and Coronary CT showing nonobstructive CAD along LAD and LCx), HTN, Type 2 DM, and family history of CAD who  was admitted on 07/28/2018 with tachycardia and chest pains by Dr. Robb Matar.   Plan:- 1)Chest Pain---Troponin peaked at 0.13, discussed with cardiology team, patient remains chest pain-free , echo with preserved EF of 60 to 65% without regional wall motion abnormalities,  patient does have diastolic dysfunction, cardiology service recommended conservative medical management with metoprolol, prior to admission patient was on clonidine.. This is been continued to avoid rebound hypertension, watch for bradycardia, patient states he is intolerant to statins, prior to admission was on Zetia  2) asymptomatic cholelithiasis--- clinically and radiologically gallstone is been present for a while now, please see previous imaging studies general surgery consult appreciated, may follow-up as outpatient if symptomatic for lap chole, low-fat diet  advised  3) alcohol Use--- MVI and lorazepam per CIWA protocol  4)DM2-prior to admission was on Victoza, metformin, Janumet and Lantus, metformin and Jardiance were held on admission  5)Atrial tachycardia/frequent ectopy--- Dr. Wyline Mood from cardiology service reviewed EKG and telemetry recordings it appears that patient has frequent PACs, frequent PVCs (couplets),  atrial tachycardia, also had wide-complex tachycardia which cardiologist believe is SVT with aberrancy rather than frank V. Tach... No evidence of A. fib   Disposition/Need for in-Hospital Stay- patient unable to be discharged at this time due to elevated troponin episodes of significant tachycardia, beta-blockers being titrated needs to be on telemetry monitored unit as per cardiologist  Code Status : full  Family Communication:   S/o at bedside   Disposition Plan  : home  Consults  :  cardiology  DVT Prophylaxis  :  Lovenox    Lab Results  Component Value Date   PLT 236 07/29/2018    Inpatient Medications  Scheduled Meds: . amLODipine  5 mg Oral Daily  . aspirin EC  81 mg Oral Daily  . cloNIDine  0.2 mg Oral BID  . enoxaparin (LOVENOX) injection  40 mg Subcutaneous Q24H  . hydrochlorothiazide  25 mg Oral Daily  . insulin aspart  0-15 Units Subcutaneous TID WC  . insulin glargine  28 Units Subcutaneous QHS  . irbesartan  300 mg Oral Daily  . [  START ON 07/30/2018] metoprolol succinate  50 mg Oral Daily  . pantoprazole  40 mg Oral Daily  . phosphorus  500 mg Oral BID  . potassium chloride SA  40 mEq Oral Daily  . rOPINIRole  2 mg Oral QHS   Continuous Infusions: PRN Meds:.acetaminophen **OR** acetaminophen, HYDROmorphone (DILAUDID) injection, metoprolol tartrate, temazepam    Anti-infectives (From admission, onward)   None        Objective:   Vitals:   07/29/18 1300 07/29/18 1400 07/29/18 1423 07/29/18 1430  BP: (!) 148/88  (!) 143/82 125/73  Pulse: 84 97 88 85  Resp: (!) 26 19 19  (!) 25  Temp:       TempSrc:      SpO2: 97% 96% 97% 96%  Weight:      Height:        Wt Readings from Last 3 Encounters:  07/29/18 99.5 kg  04/17/17 109.6 kg  02/16/17 104.3 kg     Intake/Output Summary (Last 24 hours) at 07/29/2018 1443 Last data filed at 07/29/2018 1000 Gross per 24 hour  Intake 803.06 ml  Output -  Net 803.06 ml     Physical Exam Patient is examined daily including today on 07/29/18 , exams remain the same as of yesterday except that has changed   Gen:- Awake Alert,  In no apparent distress  HEENT:- [place].AT, No sclera icterus Neck-Supple Neck,No JVD,.  Lungs-  CTAB , fair symmetrical air movement CV- S1, S2 normal, irregular but not irregularly irregular Abd-  +ve B.Sounds, Abd Soft, No tenderness,    Extremity/Skin:- No  edema, pedal pulses present  Psych-affect is appropriate, oriented x3 Neuro-no new focal deficits, no tremors   Data Review:   Micro Results Recent Results (from the past 240 hour(s))  MRSA PCR Screening     Status: None   Collection Time: 07/29/18  9:32 AM  Result Value Ref Range Status   MRSA by PCR NEGATIVE NEGATIVE Final    Comment:        The GeneXpert MRSA Assay (FDA approved for NASAL specimens only), is one component of a comprehensive MRSA colonization surveillance program. It is not intended to diagnose MRSA infection nor to guide or monitor treatment for MRSA infections. Performed at [facility], [address] [place], [place] [address]     Radiology Reports Ct Abdomen Pelvis W Contrast  Result Date: 07/28/2018 CLINICAL DATA:  Upper abdominal pain with nausea and vomiting EXAM: CT ABDOMEN AND PELVIS WITH CONTRAST TECHNIQUE: Multidetector CT imaging of the abdomen and pelvis was performed using the standard protocol following bolus administration of intravenous contrast. CONTRAST:  OMNIPAQUE IOHEXOL 300 MG/ML  SOLN COMPARISON:  CT 02/16/2017 FINDINGS: Lower chest: Lung bases demonstrate subsegmental atelectasis at the right  base. No pleural effusion. The heart size is within normal limits. Hepatobiliary: Small stone in the gallbladder. No focal hepatic abnormality or biliary dilatation Pancreas: Unremarkable. No pancreatic ductal dilatation or surrounding inflammatory changes. Spleen: Normal in size without focal abnormality. Adrenals/Urinary Tract: Adrenal glands are normal. No hydronephrosis. Subcentimeter hypodensities within the kidneys, too small to further characterize. 17 mm intermediate density lesion mid right kidney. 12 mm intermediate density medial midpole left kidney. 14 mm intermediate density midpole posterior left kidney. Bladder is normal. Stomach/Bowel: Stomach is within normal limits. Appendix appears normal. No evidence of bowel wall thickening, distention, or inflammatory changes. Sigmoid colon diverticular disease without acute inflammatory process. Vascular/Lymphatic: Mild aortic atherosclerosis. No aneurysm. No significantly enlarged lymph nodes. Reproductive: Prostate is  unremarkable. Other: Fat within the left greater than right inguinal canal. No free air or free fluid Musculoskeletal: No acute or suspicious abnormality. Degenerative change most prominent at L4-L5. IMPRESSION: 1. No CT evidence for acute intra-abdominal or pelvic abnormality. 2. Indeterminate bilateral small renal lesions. Suggest nonemergent MRI for further evaluation. 3. Small stone in the gallbladder 4. Sigmoid colon diverticular disease without acute inflammatory change Electronically Signed   By: Jasmine PangKim  Fujinaga M.D.   On: 07/28/2018 19:31     CBC Recent Labs  Lab 07/28/18 1252 07/29/18 0539  WBC 14.3* 13.4*  HGB 16.5 14.9  HCT 49.2 46.0  PLT 256 236  MCV 83.5 86.0  MCH 28.0 27.9  MCHC 33.5 32.4  RDW 13.9 13.9  LYMPHSABS  --  1.8  MONOABS  --  1.3*  EOSABS  --  0.0  BASOSABS  --  0.0    Chemistries  Recent Labs  Lab 07/28/18 1252 07/29/18 0539  NA 139 137  K 3.3* 3.6  CL 99 102  CO2 24 25  GLUCOSE 243* 150*   BUN 13 16  CREATININE 0.84 0.80  CALCIUM 9.5 8.5*  MG 2.0  --   AST 19 14*  ALT 26 23  ALKPHOS 53 46  BILITOT 1.0 0.7   ------------------------------------------------------------------------------------------------------------------ No results for input(s): CHOL, HDL, LDLCALC, TRIG, CHOLHDL, LDLDIRECT in the last 72 hours.  No results found for: HGBA1C ------------------------------------------------------------------------------------------------------------------ Recent Labs    07/29/18 1124  TSH 0.589   ------------------------------------------------------------------------------------------------------------------ No results for input(s): VITAMINB12, FOLATE, FERRITIN, TIBC, IRON, RETICCTPCT in the last 72 hours.  Coagulation profile Recent Labs  Lab 07/28/18 2121  INR 1.06    No results for input(s): DDIMER in the last 72 hours.  Cardiac Enzymes Recent Labs  Lab 07/28/18 2121 07/29/18 0539 07/29/18 1015  TROPONINI 0.05* 0.13* 0.09*   ------------------------------------------------------------------------------------------------------------------ No results found for: BNP   Shon Haleourage Sevilla Murtagh M.D on 07/29/2018 at 2:43 PM  Go to [email or web address] - for contact info  [facility] - Office  [phone]

## 2018-07-30 DIAGNOSIS — I4891 Unspecified atrial fibrillation: Secondary | ICD-10-CM | POA: Diagnosis not present

## 2018-07-30 DIAGNOSIS — I471 Supraventricular tachycardia: Secondary | ICD-10-CM | POA: Diagnosis not present

## 2018-07-30 LAB — CBC WITH DIFFERENTIAL/PLATELET
ABS IMMATURE GRANULOCYTES: 0.02 10*3/uL (ref 0.00–0.07)
BASOS PCT: 1 %
Basophils Absolute: 0.1 10*3/uL (ref 0.0–0.1)
EOS ABS: 0.2 10*3/uL (ref 0.0–0.5)
Eosinophils Relative: 2 %
HCT: 45.3 % (ref 39.0–52.0)
Hemoglobin: 14.9 g/dL (ref 13.0–17.0)
IMMATURE GRANULOCYTES: 0 %
Lymphocytes Relative: 30 %
Lymphs Abs: 2.9 10*3/uL (ref 0.7–4.0)
MCH: 28.3 pg (ref 26.0–34.0)
MCHC: 32.9 g/dL (ref 30.0–36.0)
MCV: 86 fL (ref 80.0–100.0)
Monocytes Absolute: 1 10*3/uL (ref 0.1–1.0)
Monocytes Relative: 11 %
NEUTROS PCT: 56 %
Neutro Abs: 5.6 10*3/uL (ref 1.7–7.7)
PLATELETS: 224 10*3/uL (ref 150–400)
RBC: 5.27 MIL/uL (ref 4.22–5.81)
RDW: 13.9 % (ref 11.5–15.5)
WBC: 9.8 10*3/uL (ref 4.0–10.5)
nRBC: 0 % (ref 0.0–0.2)

## 2018-07-30 LAB — BASIC METABOLIC PANEL
Anion gap: 8 (ref 5–15)
BUN: 21 mg/dL (ref 8–23)
CO2: 29 mmol/L (ref 22–32)
Calcium: 8.6 mg/dL — ABNORMAL LOW (ref 8.9–10.3)
Chloride: 101 mmol/L (ref 98–111)
Creatinine, Ser: 0.88 mg/dL (ref 0.61–1.24)
GFR calc Af Amer: >60 mL/min (ref >60)
GFR calc non Af Amer: >60 mL/min (ref >60)
GLUCOSE: 153 mg/dL — AB (ref 70–99)
Potassium: 3.5 mmol/L (ref 3.5–5.1)
Sodium: 138 mmol/L (ref 135–145)

## 2018-07-30 LAB — HIV ANTIBODY (ROUTINE TESTING W REFLEX): HIV Screen 4th Generation wRfx: NONREACTIVE

## 2018-07-30 LAB — GLUCOSE, CAPILLARY: Glucose-Capillary: 232 mg/dL — ABNORMAL HIGH (ref 70–99)

## 2018-07-30 MED ORDER — METOPROLOL SUCCINATE ER 25 MG PO TB24: 75.0000 mg | ORAL_TABLET | Freq: Every day | ORAL | 5 refills | Status: DC

## 2018-07-30 MED ORDER — METOPROLOL SUCCINATE ER 50 MG PO TB24: 75.0000 mg | ORAL_TABLET | Freq: Every day | ORAL | Status: DC

## 2018-07-30 MED ORDER — METOPROLOL SUCCINATE ER 25 MG PO TB24
25.0000 mg | ORAL_TABLET | Freq: Once | ORAL | Status: AC
  Administered 2018-07-30: 25 mg via ORAL
  Filled 2018-07-30: qty 1

## 2018-07-30 MED ORDER — ADULT MULTIVITAMIN W/MINERALS CH: 1.0000 | ORAL_TABLET | Freq: Every day | ORAL | 3 refills | Status: DC

## 2018-07-30 NOTE — Discharge Instructions (Signed)
1) take medications including metoprolol XL 75 mg daily as prescribed 2) follow-up with cardiologist as outpatient as advised and as scheduled

## 2018-07-30 NOTE — Progress Notes (Signed)
Patient is to be discharged home and in stable condition. IV and telemetry removed, WNL. Patient and significant other given discharge instructions and verbalized understanding. All questions addressed and answered. Patient denies the need for a wheelchair escort out, requesting to ambulate.   Quita Skye, RN

## 2018-07-30 NOTE — Discharge Summary (Signed)
Russell FullerJohn F Manalang, is a 68 y.o. male  DOB 1951/03/10  MRN [id number].  Admission date:  07/28/2018  Admitting Physician  Bobette Moavid Manuel Ortiz, MD  Discharge Date:  07/30/2018   Primary MD  Benita StabileHall, Treyvin Z, MD  Recommendations for primary care physician for things to follow:   1) take medications including metoprolol XL 75 mg daily as prescribed 2) follow-up with cardiologist as outpatient as advised and as scheduled  Admission Diagnosis  Ventricular tachycardia (HCC) [I47.2] Calculus of gallbladder without cholecystitis without obstruction [K80.20] Atrial fibrillation with RVR (HCC) [I48.91]   Discharge Diagnosis  Ventricular tachycardia (HCC) [I47.2] Calculus of gallbladder without cholecystitis without obstruction [K80.20] Atrial fibrillation with RVR (HCC) [I48.91]    Principal Problem:   Chest pain in adult Active Problems:   Hypertension   Cholelithiasis   Lesion of both native kidneys   Hypokalemia   Hypophosphatemia   Leukocytosis   Type 2 diabetes mellitus (HCC)   Atrial tachycardia (HCC)   Atrial tachycardia, paroxysmal (HCC)      Past Medical History:  Diagnosis Date  . CAD (coronary artery disease)    a. mild nonobstructive CAD by cath in 2010 b. NST in 11/2015 showing a fixed defect with no ischemia and Coronary CT showing nonobstructive CAD along LAD and LCx  . Diabetes mellitus without complication (HCC)   . Diverticulitis   . Hypertension     Past Surgical History:  Procedure Laterality Date  . CERVICAL FUSION    . nasal septum repair       HPI  from the history and physical done on the day of admission:    Chief Complaint: Abdominal pain, nausea and vomiting.  HPI: Russell Morrow is a 68 y.o. male with medical history significant of type 2 diabetes, diverticulosis, diverticulitis, hypertension, CAD, history of MI, history of cholelithiasis who is coming to the emergency  department with complaints of abdominal pain since yesterday evening after he ate a cheeseburger with steak fries.  Since then he has had frequent dry heaving with several episodes of emesis.  He denies fever, chills, hematemesis, diarrhea, constipation, melena or hematochezia.  No dysuria, frequency or hematuria.  No chest pain, dizziness, diaphoresis, PND, orthopnea, recent lower extremity pitting edema, but gets occasional palpitations.  No polyuria, polydipsia, polyphagia or blurred vision.  No pruritus.  ED Course: 97.8 F, pulse 88, respirations 20, blood pressure 173/105 mmHg and O2 sat 98% on room air.  While in the ER, the patient had NSVT, followed by atrial fibrillation with RVR.  This responded to IV metoprolol.  He was also given clonidine 0.2 mg p.o. x1 dose along with a 1000 mL NS bolus, Zofran 4 mg IVP x1 dose and 2 doses of hydromorphone 1 mg IVP.  I added famotidine 20 mg IVPB and magnesium sulfate 2 g IVPB.  His urinalysis shows increase in a specific gravity, glucosuria of 500, ketonuria of 80 and proteinuria 100 mg/dL.  There was small hemoglobinuria.  Microscopic examination is normal.  White count was  14.3, hemoglobin 16.5 g/dL and platelets 106.  CMP shows a potassium of 3.3 mmol/L and a blood glucose of 243 mg/dL.  All other values are within normal limits.  Lipase level was normal.  Imaging: CT abdomen did not show any acute intra-abdominal or intrapelvic pathology.  However, there was colonic diverticulosis, renal hypodensities and again cholelithiasis was seen in the gallbladder.  Please see images and full radiology report for further detail.    Hospital Course:   Brief summary 68 y.o.malewith past medical history of CAD (mild nonobstructive CAD by cath in 2010 with NST in 11/2015 showing a fixed defect with no ischemia and Coronary CT showing nonobstructive CAD along LAD and LCx), HTN, Type 2 DM, and family history of CADwho was evaluated and admitted for chest pain  and questionable atrial fibrillation--- review of telemetry and EKG reveals wanderaing atrial pacemaker, short runs of atach, 5 beat run NSVT with aberrant conduction-----no frank A. Fib, no V. Tach  Plan:- 1)Elevated Troponin-- chest pain free, troponin peaked at 0.13, suspect elevated troponin was secondary to demand ischemia, echo with preserved EF of 60 to 65% without regional wall motion abnormalities,  patient does have diastolic dysfunction, cardiology service recommended conservative medical management with metoprolol, prior to admission patient was on clonidine.. This is been continued to avoid rebound hypertension, watch for bradycardia, patient states he is intolerant to statins, prior to admission was on Zetia, metoprolol titrated up to 75 mg daily due to tachy-arrhythmias  2) tachyarrhythmia with questionable atrial fibrillation--- review of telemetry and EKG reveals wanderaing atrial pacemaker, short runs of atach, 5 beat run NSVT with aberrant conduction--------Atrial tachycardia/frequent ectopy--- Dr. Wyline Mood from cardiology service reviewed EKG and telemetry recordings it appears that patient has frequent PACs, frequent PVCs (couplets),  atrial tachycardia, also had wide-complex tachycardia which cardiologist believe is SVT with aberrancy rather than frank V. Tach... No evidence of A. fib ----no frank A. Fib, no V. Tach----please see #1 above (metoprolol titrated up to 75 mg daily due to tachy-arrhythmias)  3)Asymptomatic cholelithiasis--- clinically and radiologically gallstone is been present for a while now, please see previous imaging studies general surgery consult appreciated, may follow-up as outpatient if symptomatic for lap chole, low-fat diet advised  4) alcohol Use--- MVI and lorazepam per CIWA protocol  5)DM2-prior to admission was on Victoza, metformin, Janumet and Lantus, okay to resume home meds  Code Status : full  Family Communication:   S/o at  bedside   Disposition Plan  : home  Consults  :  cardiology  Discharge Condition: stable  Follow UP  Follow-up Information    Ellsworth Lennox, PA-C Follow up on 08/17/2018.   Specialties:  Physician Assistant, Cardiology Why:  Cardiology Hospital Follow-Up on 08/17/2018 at 3:30 PM with Randall An, PA-C (works with Dr. Eden Emms). Please call to reschedule if date/time does not work.  Contact information: [address] [place] [address] [phone]           Diet and Activity recommendation:  As advised  Discharge Instructions    Discharge Instructions    Call MD for:  difficulty breathing, headache or visual disturbances   Complete by:  As directed    Call MD for:  persistant dizziness or light-headedness   Complete by:  As directed    Call MD for:  persistant nausea and vomiting   Complete by:  As directed    Call MD for:  severe uncontrolled pain   Complete by:  As directed  Call MD for:  temperature >100.4   Complete by:  As directed    Diet - low sodium heart healthy   Complete by:  As directed    Diet Carb Modified   Complete by:  As directed    Discharge instructions   Complete by:  As directed    1) take medications including metoprolol XL 75 mg daily as prescribed 2) follow-up with cardiologist as outpatient as advised and as scheduled   Increase activity slowly   Complete by:  As directed         Discharge Medications     Allergies as of 07/30/2018      Reactions   Statins    Muscle aches, liver pain      Medication List    TAKE these medications   acetaminophen 325 MG tablet Commonly known as:  TYLENOL Take 650 mg by mouth every 6 (six) hours as needed for mild pain or moderate pain.   cloNIDine 0.2 MG tablet Commonly known as:  CATAPRES Take 0.2 mg by mouth 2 (two) times daily.   diphenhydrAMINE 25 mg capsule Commonly known as:  BENADRYL Take 25 mg by mouth at bedtime.   Esomeprazole Magnesium 20 MG Tbec Take 20 mg by  mouth 2 (two) times daily before a meal.   insulin glargine 100 UNIT/ML injection Commonly known as:  LANTUS Inject 28 Units into the skin at bedtime.   JARDIANCE 10 MG Tabs tablet Generic drug:  empagliflozin Take 10 mg by mouth daily.   metFORMIN 1000 MG tablet Commonly known as:  GLUCOPHAGE Take 1,000 mg by mouth 2 (two) times daily with a meal.   metoprolol succinate 25 MG 24 hr tablet Commonly known as:  TOPROL-XL Take 3 tablets (75 mg total) by mouth daily. Start taking on:  July 31, 2018 What changed:  how much to take   multivitamin with minerals Tabs tablet Take 1 tablet by mouth daily. Start taking on:  July 31, 2018   Olmesartan-amLODIPine-HCTZ 40-5-25 MG Tabs Take 1 tablet by mouth daily.   potassium chloride SA 20 MEQ tablet Commonly known as:  K-DUR,KLOR-CON Take 2 tablets (40 mEq total) by mouth daily. What changed:  how much to take   rOPINIRole 1 MG tablet Commonly known as:  REQUIP Take 2 mg by mouth at bedtime.   temazepam 15 MG capsule Commonly known as:  RESTORIL Take 15 mg by mouth at bedtime.   VICTOZA [place] Inject 1.8 mLs into the skin at bedtime.       Major procedures and Radiology Reports - PLEASE review detailed and final reports for all details, in brief -   Ct Abdomen Pelvis W Contrast  Result Date: 07/28/2018 CLINICAL DATA:  Upper abdominal pain with nausea and vomiting EXAM: CT ABDOMEN AND PELVIS WITH CONTRAST TECHNIQUE: Multidetector CT imaging of the abdomen and pelvis was performed using the standard protocol following bolus administration of intravenous contrast. CONTRAST:  OMNIPAQUE IOHEXOL 300 MG/ML  SOLN COMPARISON:  CT 02/16/2017 FINDINGS: Lower chest: Lung bases demonstrate subsegmental atelectasis at the right base. No pleural effusion. The heart size is within normal limits. Hepatobiliary: Small stone in the gallbladder. No focal hepatic abnormality or biliary dilatation Pancreas: Unremarkable. No pancreatic  ductal dilatation or surrounding inflammatory changes. Spleen: Normal in size without focal abnormality. Adrenals/Urinary Tract: Adrenal glands are normal. No hydronephrosis. Subcentimeter hypodensities within the kidneys, too small to further characterize. 17 mm intermediate density lesion mid right kidney. 12 mm intermediate density medial  midpole left kidney. 14 mm intermediate density midpole posterior left kidney. Bladder is normal. Stomach/Bowel: Stomach is within normal limits. Appendix appears normal. No evidence of bowel wall thickening, distention, or inflammatory changes. Sigmoid colon diverticular disease without acute inflammatory process. Vascular/Lymphatic: Mild aortic atherosclerosis. No aneurysm. No significantly enlarged lymph nodes. Reproductive: Prostate is unremarkable. Other: Fat within the left greater than right inguinal canal. No free air or free fluid Musculoskeletal: No acute or suspicious abnormality. Degenerative change most prominent at L4-L5. IMPRESSION: 1. No CT evidence for acute intra-abdominal or pelvic abnormality. 2. Indeterminate bilateral small renal lesions. Suggest nonemergent MRI for further evaluation. 3. Small stone in the gallbladder 4. Sigmoid colon diverticular disease without acute inflammatory change Electronically Signed   By: Jasmine Pang M.D.   On: 07/28/2018 19:31    Micro Results   Recent Results (from the past 240 hour(s))  MRSA PCR Screening     Status: None   Collection Time: 07/29/18  9:32 AM  Result Value Ref Range Status   MRSA by PCR NEGATIVE NEGATIVE Final    Comment:        The GeneXpert MRSA Assay (FDA approved for NASAL specimens only), is one component of a comprehensive MRSA colonization surveillance program. It is not intended to diagnose MRSA infection nor to guide or monitor treatment for MRSA infections. Performed at [facility], [address], [place], [place] [address]        Today   Subjective    Hakop Kirt  today has no new complaints, chest pain-free, no dizziness no palpitations no shortness of breath, significant other at bedside, patient eager to go home          Patient has been seen and examined prior to discharge   Objective   Blood pressure (!) 163/99, pulse (!) 108, temperature 97.9 F (36.6 C), temperature source Oral, resp. rate 17, height 6\' 1"  (1.854 m), weight 99 kg, SpO2 93 %.   Intake/Output Summary (Last 24 hours) at 07/30/2018 1030 Last data filed at 07/30/2018 0500 Gross per 24 hour  Intake 558.28 ml  Output 900 ml  Net -341.72 ml    Exam Gen:- Awake Alert, no acute distress  HEENT:- [place].AT, No sclera icterus Neck-Supple Neck,No JVD,.  Lungs-  CTAB , good air movement bilaterally  CV- S1, S2 normal, regular Abd-  +ve B.Sounds, Abd Soft, No tenderness,    Extremity/Skin:- No  edema,   good pulses Psych-affect is appropriate, oriented x3 Neuro-no new focal deficits, no tremors    Data Review   CBC w Diff:  Lab Results  Component Value Date   WBC 9.8 07/30/2018   HGB 14.9 07/30/2018   HCT 45.3 07/30/2018   PLT 224 07/30/2018   LYMPHOPCT 30 07/30/2018   MONOPCT 11 07/30/2018   EOSPCT 2 07/30/2018   BASOPCT 1 07/30/2018    CMP:  Lab Results  Component Value Date   NA 138 07/30/2018   K 3.5 07/30/2018   CL 101 07/30/2018   CO2 29 07/30/2018   BUN 21 07/30/2018   CREATININE 0.88 07/30/2018   PROT 7.2 07/29/2018   ALBUMIN 4.0 07/29/2018   BILITOT 0.7 07/29/2018   ALKPHOS 46 07/29/2018   AST 14 (L) 07/29/2018   ALT 23 07/29/2018  .   Total Discharge time is about 33 minutes  Shon Hale M.D on 07/30/2018 at 10:30 AM  Go to [email or web address] -  for contact info  [facility] - Office  [phone]

## 2018-07-30 NOTE — Progress Notes (Signed)
Progress Note  Patient Name: Russell Morrow Date of Encounter: 07/30/2018  Primary Cardiologist: Charlton Haws, MD   Subjective   No complaints  Inpatient Medications    Scheduled Meds: . amLODipine  5 mg Oral Daily  . aspirin EC  81 mg Oral Daily  . cloNIDine  0.2 mg Oral BID  . enoxaparin (LOVENOX) injection  40 mg Subcutaneous Q24H  . folic acid  1 mg Oral Daily  . hydrochlorothiazide  25 mg Oral Daily  . insulin aspart  0-15 Units Subcutaneous TID WC  . insulin glargine  28 Units Subcutaneous QHS  . irbesartan  300 mg Oral Daily  . metoprolol succinate  50 mg Oral Daily  . multivitamin with minerals  1 tablet Oral Daily  . pantoprazole  40 mg Oral Daily  . potassium chloride SA  40 mEq Oral Daily  . rOPINIRole  2 mg Oral QHS  . temazepam  15 mg Oral QHS  . thiamine  100 mg Oral Daily   Or  . thiamine  100 mg Intravenous Daily   Continuous Infusions:  PRN Meds: acetaminophen **OR** acetaminophen, HYDROmorphone (DILAUDID) injection, LORazepam **OR** LORazepam, metoprolol tartrate   Vital Signs    Vitals:   07/30/18 0500 07/30/18 0600 07/30/18 0700 07/30/18 0743  BP: (!) 153/119 (!) 174/112 (!) 151/82   Pulse: (!) 34 (!) 101 91 85  Resp: 19 (!) 25 (!) 21 14  Temp:    97.9 F (36.6 C)  TempSrc:    Oral  SpO2: 98% 97% 96% 98%  Weight: 99 kg     Height:        Intake/Output Summary (Last 24 hours) at 07/30/2018 0838 Last data filed at 07/30/2018 0500 Gross per 24 hour  Intake 1118.7 ml  Output 900 ml  Net 218.7 ml   Last 3 Weights 07/30/2018 07/29/2018 07/28/2018  Weight (lbs) 218 lb 4.1 oz 219 lb 5.7 oz 225 lb  Weight (kg) 99 kg 99.5 kg 102.059 kg      Telemetry    wanderaing atrial pacemaker, short runs of atach, 5 beat run NSVT - Personally Reviewed  ECG    na  Physical Exam   GEN: No acute distress.   Neck: No JVD Cardiac: irreg, no murmurs, rubs, or gallops.  Respiratory: Clear to auscultation bilaterally. GI: Soft, nontender, non-distended   MS: No edema; No deformity. Neuro:  Nonfocal  Psych: Normal affect   Labs    Chemistry Recent Labs  Lab 07/28/18 1252 07/29/18 0539 07/30/18 0420  NA 139 137 138  K 3.3* 3.6 3.5  CL 99 102 101  CO2 24 25 29   GLUCOSE 243* 150* 153*  BUN 13 16 21   CREATININE 0.84 0.80 0.88  CALCIUM 9.5 8.5* 8.6*  PROT 8.0 7.2  --   ALBUMIN 4.7 4.0  --   AST 19 14*  --   ALT 26 23  --   ALKPHOS 53 46  --   BILITOT 1.0 0.7  --   GFRNONAA >60 >60 >60  GFRAA >60 >60 >60  ANIONGAP 16* 10 8     Hematology Recent Labs  Lab 07/28/18 1252 07/29/18 0539 07/30/18 0420  WBC 14.3* 13.4* 9.8  RBC 5.89* 5.35 5.27  HGB 16.5 14.9 14.9  HCT 49.2 46.0 45.3  MCV 83.5 86.0 86.0  MCH 28.0 27.9 28.3  MCHC 33.5 32.4 32.9  RDW 13.9 13.9 13.9  PLT 256 236 224    Cardiac Enzymes Recent Labs  Lab 07/28/18  2121 07/29/18 0539 07/29/18 1015  TROPONINI 0.05* 0.13* 0.09*   No results for input(s): TROPIPOC in the last 168 hours.   BNPNo results for input(s): BNP, PROBNP in the last 168 hours.   DDimer No results for input(s): DDIMER in the last 168 hours.   Radiology    Ct Abdomen Pelvis W Contrast  Result Date: 07/28/2018 CLINICAL DATA:  Upper abdominal pain with nausea and vomiting EXAM: CT ABDOMEN AND PELVIS WITH CONTRAST TECHNIQUE: Multidetector CT imaging of the abdomen and pelvis was performed using the standard protocol following bolus administration of intravenous contrast. CONTRAST:  100mL OMNIPAQUE IOHEXOL 300 MG/ML  SOLN COMPARISON:  CT 02/16/2017 FINDINGS: Lower chest: Lung bases demonstrate subsegmental atelectasis at the right base. No pleural effusion. The heart size is within normal limits. Hepatobiliary: Small stone in the gallbladder. No focal hepatic abnormality or biliary dilatation Pancreas: Unremarkable. No pancreatic ductal dilatation or surrounding inflammatory changes. Spleen: Normal in size without focal abnormality. Adrenals/Urinary Tract: Adrenal glands are normal. No  hydronephrosis. Subcentimeter hypodensities within the kidneys, too small to further characterize. 17 mm intermediate density lesion mid right kidney. 12 mm intermediate density medial midpole left kidney. 14 mm intermediate density midpole posterior left kidney. Bladder is normal. Stomach/Bowel: Stomach is within normal limits. Appendix appears normal. No evidence of bowel wall thickening, distention, or inflammatory changes. Sigmoid colon diverticular disease without acute inflammatory process. Vascular/Lymphatic: Mild aortic atherosclerosis. No aneurysm. No significantly enlarged lymph nodes. Reproductive: Prostate is unremarkable. Other: Fat within the left greater than right inguinal canal. No free air or free fluid Musculoskeletal: No acute or suspicious abnormality. Degenerative change most prominent at L4-L5. IMPRESSION: 1. No CT evidence for acute intra-abdominal or pelvic abnormality. 2. Indeterminate bilateral small renal lesions. Suggest nonemergent MRI for further evaluation. 3. Small stone in the gallbladder 4. Sigmoid colon diverticular disease without acute inflammatory change Electronically Signed   By: Jasmine PangKim  Fujinaga M.D.   On: 07/28/2018 19:31    Cardiac Studies     Patient Profile     Marvell FullerJohn F Gilbertson is a 68 y.o. male with past medical history of CAD (mild nonobstructive CAD by cath in 2010 with NST in 11/2015 showing a fixed defect with no ischemia and Coronary CT showing nonobstructive CAD along LAD and LCx), HTN, Type 2 DM, and family history of CAD who is being seen today for the evaluation of chest pain and questionable atrial fibrillation at the request of Dr. Robb Matarrtiz.    Assessment & Plan    1. PSVT - he reports prior history of PACs and PVCs, has been on beta blocker at home - in ER multiple episodes of atach with elevated rates. One episode of wide complex tachycardia that upon review and discussion with EP was SVT with aberrancy as opposed to VT - patient presented with  hypokalemia and not being able to take his beta blocker at home for last 2 days due to GI symptoms  - back on Toprol, dose increased to 50mg  daily. K has been replaced.  - echo shows LVEF 60-65%, no WMAs  - tele shows some PACs and PVCs, no significnat sustained arrhythmias - we will intcrease Toprol to 75mg  dialy.    2. Elevated tropononin - mild elevation in setting of significnat tachycardia from SVT, trending down - normal echo - CT in 2017 without significant disease - do not see indication to repeat ischemic testing at this time.   - no chest pain, prior symptoms were LUQ pain associated with nausea  and vomiting.    3. Choelithiasis - recent nausea, LUQ abdominal pain. H&P reports symptoms started after eating a cheeseburger and fries. Frequent dry heaving at home and emesis - no plans for intervention at this time per surgeyr   [facility] for discharge from cardiac standpoint. We will arrange f/u.   For questions or updates, please contact [facility] Please consult [email or web address] for contact info under        Signed, Dina Rich, MD  07/30/2018, 8:38 AM

## 2018-08-02 DIAGNOSIS — R109 Unspecified abdominal pain: Secondary | ICD-10-CM | POA: Diagnosis not present

## 2018-08-02 DIAGNOSIS — K8080 Other cholelithiasis without obstruction: Secondary | ICD-10-CM | POA: Diagnosis not present

## 2018-08-02 DIAGNOSIS — R000 Tachycardia, unspecified: Secondary | ICD-10-CM | POA: Diagnosis not present

## 2018-08-02 DIAGNOSIS — K219 Gastro-esophageal reflux disease without esophagitis: Secondary | ICD-10-CM | POA: Diagnosis not present

## 2018-08-13 ENCOUNTER — Encounter (HOSPITAL_COMMUNITY): Payer: Self-pay | Admitting: *Deleted

## 2018-08-13 ENCOUNTER — Observation Stay (HOSPITAL_COMMUNITY)
Admission: EM | Admit: 2018-08-13 | Discharge: 2018-08-14 | Disposition: A | Discharge status: Home / Self Care | Payer: Medicare Other | Attending: Cardiology | Admitting: Cardiology

## 2018-08-13 ENCOUNTER — Emergency Department (HOSPITAL_COMMUNITY): Payer: Medicare Other

## 2018-08-13 ENCOUNTER — Encounter (HOSPITAL_COMMUNITY)
Admission: EM | Disposition: A | Discharge status: Home / Self Care | Payer: Self-pay | Source: Home / Self Care | Attending: Emergency Medicine

## 2018-08-13 ENCOUNTER — Other Ambulatory Visit: Payer: Self-pay

## 2018-08-13 DIAGNOSIS — I471 Supraventricular tachycardia: Secondary | ICD-10-CM | POA: Insufficient documentation

## 2018-08-13 DIAGNOSIS — R7989 Other specified abnormal findings of blood chemistry: Secondary | ICD-10-CM | POA: Diagnosis not present

## 2018-08-13 DIAGNOSIS — I161 Hypertensive emergency: Principal | ICD-10-CM | POA: Insufficient documentation

## 2018-08-13 DIAGNOSIS — Z8249 Family history of ischemic heart disease and other diseases of the circulatory system: Secondary | ICD-10-CM | POA: Insufficient documentation

## 2018-08-13 DIAGNOSIS — Z794 Long term (current) use of insulin: Secondary | ICD-10-CM | POA: Diagnosis not present

## 2018-08-13 DIAGNOSIS — I493 Ventricular premature depolarization: Secondary | ICD-10-CM | POA: Insufficient documentation

## 2018-08-13 DIAGNOSIS — Z888 Allergy status to other drugs, medicaments and biological substances status: Secondary | ICD-10-CM | POA: Insufficient documentation

## 2018-08-13 DIAGNOSIS — R918 Other nonspecific abnormal finding of lung field: Secondary | ICD-10-CM | POA: Diagnosis not present

## 2018-08-13 DIAGNOSIS — I2510 Atherosclerotic heart disease of native coronary artery without angina pectoris: Secondary | ICD-10-CM | POA: Insufficient documentation

## 2018-08-13 DIAGNOSIS — R1013 Epigastric pain: Secondary | ICD-10-CM | POA: Diagnosis not present

## 2018-08-13 DIAGNOSIS — I700 Atherosclerosis of aorta: Secondary | ICD-10-CM | POA: Insufficient documentation

## 2018-08-13 DIAGNOSIS — N289 Disorder of kidney and ureter, unspecified: Secondary | ICD-10-CM | POA: Insufficient documentation

## 2018-08-13 DIAGNOSIS — Z79899 Other long term (current) drug therapy: Secondary | ICD-10-CM | POA: Insufficient documentation

## 2018-08-13 DIAGNOSIS — I213 ST elevation (STEMI) myocardial infarction of unspecified site: Secondary | ICD-10-CM | POA: Diagnosis not present

## 2018-08-13 DIAGNOSIS — E119 Type 2 diabetes mellitus without complications: Secondary | ICD-10-CM | POA: Insufficient documentation

## 2018-08-13 DIAGNOSIS — I214 Non-ST elevation (NSTEMI) myocardial infarction: Secondary | ICD-10-CM | POA: Diagnosis not present

## 2018-08-13 DIAGNOSIS — K4090 Unilateral inguinal hernia, without obstruction or gangrene, not specified as recurrent: Secondary | ICD-10-CM | POA: Insufficient documentation

## 2018-08-13 DIAGNOSIS — Z833 Family history of diabetes mellitus: Secondary | ICD-10-CM | POA: Diagnosis not present

## 2018-08-13 DIAGNOSIS — R778 Other specified abnormalities of plasma proteins: Secondary | ICD-10-CM | POA: Diagnosis present

## 2018-08-13 DIAGNOSIS — Z955 Presence of coronary angioplasty implant and graft: Secondary | ICD-10-CM

## 2018-08-13 DIAGNOSIS — I10 Essential (primary) hypertension: Secondary | ICD-10-CM | POA: Insufficient documentation

## 2018-08-13 HISTORY — PX: CORONARY STENT INTERVENTION: CATH118234

## 2018-08-13 HISTORY — PX: LEFT HEART CATH AND CORONARY ANGIOGRAPHY: CATH118249

## 2018-08-13 LAB — URINALYSIS, ROUTINE W REFLEX MICROSCOPIC
Bacteria, UA: NONE SEEN
Bilirubin Urine: NEGATIVE
Glucose, UA: >=500 mg/dL — AB
Hgb urine dipstick: NEGATIVE
Ketones, ur: 80 mg/dL — AB
Leukocytes,Ua: NEGATIVE
Nitrite: NEGATIVE
Protein, ur: NEGATIVE mg/dL
Specific Gravity, Urine: 1.025 (ref 1.005–1.030)
pH: 7 (ref 5.0–8.0)

## 2018-08-13 LAB — CBC
HCT: 50.3 % (ref 39.0–52.0)
Hemoglobin: 17 g/dL (ref 13.0–17.0)
MCH: 28.1 pg (ref 26.0–34.0)
MCHC: 33.8 g/dL (ref 30.0–36.0)
MCV: 83.3 fL (ref 80.0–100.0)
Platelets: 330 10*3/uL (ref 150–400)
RBC: 6.04 MIL/uL — ABNORMAL HIGH (ref 4.22–5.81)
RDW: 14.1 % (ref 11.5–15.5)
WBC: 11.3 10*3/uL — ABNORMAL HIGH (ref 4.0–10.5)
nRBC: 0 % (ref 0.0–0.2)

## 2018-08-13 LAB — TROPONIN I
Troponin I: 0.47 ng/mL (ref <0.03)
Troponin I: 1.36 ng/mL (ref <0.03)

## 2018-08-13 LAB — COMPREHENSIVE METABOLIC PANEL
ALT: 32 U/L (ref 0–44)
AST: 18 U/L (ref 15–41)
Albumin: 4.6 g/dL (ref 3.5–5.0)
Alkaline Phosphatase: 57 U/L (ref 38–126)
Anion gap: 14 (ref 5–15)
BUN: 18 mg/dL (ref 8–23)
CO2: 27 mmol/L (ref 22–32)
Calcium: 9.6 mg/dL (ref 8.9–10.3)
Chloride: 95 mmol/L — ABNORMAL LOW (ref 98–111)
Creatinine, Ser: 1 mg/dL (ref 0.61–1.24)
GFR calc Af Amer: >60 mL/min (ref >60)
GFR calc non Af Amer: >60 mL/min (ref >60)
Glucose, Bld: 200 mg/dL — ABNORMAL HIGH (ref 70–99)
Potassium: 3.7 mmol/L (ref 3.5–5.1)
Sodium: 136 mmol/L (ref 135–145)
Total Bilirubin: 1.1 mg/dL (ref 0.3–1.2)
Total Protein: 7.9 g/dL (ref 6.5–8.1)

## 2018-08-13 LAB — POCT ACTIVATED CLOTTING TIME
ACTIVATED CLOTTING TIME: 301 s
Activated Clotting Time: 246 seconds

## 2018-08-13 LAB — LIPASE, BLOOD: Lipase: 29 U/L (ref 11–51)

## 2018-08-13 LAB — GLUCOSE, CAPILLARY: Glucose-Capillary: 133 mg/dL — ABNORMAL HIGH (ref 70–99)

## 2018-08-13 LAB — MAGNESIUM: Magnesium: 1.9 mg/dL (ref 1.7–2.4)

## 2018-08-13 SURGERY — LEFT HEART CATH AND CORONARY ANGIOGRAPHY
Anesthesia: LOCAL

## 2018-08-13 MED ORDER — ROPINIROLE HCL 1 MG PO TABS
2.0000 mg | ORAL_TABLET | Freq: Every day | ORAL | Status: DC
  Administered 2018-08-13: 22:00:00 2 mg via ORAL
  Filled 2018-08-13: qty 2

## 2018-08-13 MED ORDER — LIDOCAINE HCL (PF) 1 % IJ SOLN
INTRAMUSCULAR | Status: DC | PRN
  Administered 2018-08-13: 2 mL

## 2018-08-13 MED ORDER — ASPIRIN EC 81 MG PO TBEC
81.0000 mg | DELAYED_RELEASE_TABLET | Freq: Every day | ORAL | Status: DC
  Administered 2018-08-14: 08:00:00 81 mg via ORAL
  Filled 2018-08-13: qty 1

## 2018-08-13 MED ORDER — ASPIRIN 81 MG PO CHEW: 81.0000 mg | CHEWABLE_TABLET | Freq: Every day | ORAL | Status: DC

## 2018-08-13 MED ORDER — IOHEXOL 350 MG/ML SOLN
INTRAVENOUS | Status: DC | PRN
  Administered 2018-08-13: 140 mL via INTRA_ARTERIAL

## 2018-08-13 MED ORDER — HEART ATTACK BOUNCING BOOK
Freq: Once | Status: AC
  Administered 2018-08-14: 02:00:00 1

## 2018-08-13 MED ORDER — ACETAMINOPHEN 325 MG PO TABS: 650.0000 mg | ORAL_TABLET | ORAL | Status: DC | PRN

## 2018-08-13 MED ORDER — NITROGLYCERIN 0.4 MG SL SUBL: 0.4000 mg | SUBLINGUAL_TABLET | SUBLINGUAL | Status: DC | PRN

## 2018-08-13 MED ORDER — THE SENSUOUS HEART BOOK
Freq: Once | Status: AC
  Administered 2018-08-14: 1

## 2018-08-13 MED ORDER — HEPARIN SODIUM (PORCINE) 1000 UNIT/ML IJ SOLN
INTRAMUSCULAR | Status: AC
  Filled 2018-08-13: qty 1

## 2018-08-13 MED ORDER — HEPARIN BOLUS VIA INFUSION
4000.0000 [IU] | Freq: Once | INTRAVENOUS | Status: AC
  Administered 2018-08-13: 4000 [IU] via INTRAVENOUS

## 2018-08-13 MED ORDER — HEPARIN SODIUM (PORCINE) 1000 UNIT/ML IJ SOLN
INTRAMUSCULAR | Status: DC | PRN
  Administered 2018-08-13: 4000 [IU] via INTRAVENOUS
  Administered 2018-08-13 (×2): 5000 [IU] via INTRAVENOUS

## 2018-08-13 MED ORDER — FENTANYL CITRATE (PF) 100 MCG/2ML IJ SOLN
INTRAMUSCULAR | Status: AC
  Filled 2018-08-13: qty 2

## 2018-08-13 MED ORDER — HEPARIN (PORCINE) 25000 UT/250ML-% IV SOLN
1300.0000 [IU]/h | INTRAVENOUS | Status: DC
  Administered 2018-08-13: 1300 [IU]/h via INTRAVENOUS
  Filled 2018-08-13: qty 250

## 2018-08-13 MED ORDER — SODIUM CHLORIDE 0.9 % IV BOLUS
1000.0000 mL | Freq: Once | INTRAVENOUS | Status: AC
  Administered 2018-08-13: 1000 mL via INTRAVENOUS

## 2018-08-13 MED ORDER — SODIUM CHLORIDE 0.9 % IV SOLN
INTRAVENOUS | Status: DC
  Administered 2018-08-13: 18:00:00 via INTRAVENOUS

## 2018-08-13 MED ORDER — HYDROMORPHONE HCL 1 MG/ML IJ SOLN
1.0000 mg | Freq: Once | INTRAMUSCULAR | Status: AC
  Administered 2018-08-13: 1 mg via INTRAVENOUS
  Filled 2018-08-13: qty 1

## 2018-08-13 MED ORDER — ONDANSETRON HCL 4 MG/2ML IJ SOLN: 4.0000 mg | Freq: Four times a day (QID) | INTRAMUSCULAR | Status: DC | PRN

## 2018-08-13 MED ORDER — ASPIRIN 81 MG PO CHEW
CHEWABLE_TABLET | ORAL | Status: DC | PRN
  Administered 2018-08-13: 324 mg via ORAL

## 2018-08-13 MED ORDER — TICAGRELOR 90 MG PO TABS
90.0000 mg | ORAL_TABLET | Freq: Two times a day (BID) | ORAL | Status: DC
  Administered 2018-08-14: 90 mg via ORAL
  Filled 2018-08-13: qty 1

## 2018-08-13 MED ORDER — SODIUM CHLORIDE 0.9 % WEIGHT BASED INFUSION
1.0000 mL/kg/h | INTRAVENOUS | Status: AC
  Administered 2018-08-14: 04:00:00 1 mL/kg/h via INTRAVENOUS

## 2018-08-13 MED ORDER — SODIUM CHLORIDE 0.9% FLUSH: 3.0000 mL | INTRAVENOUS | Status: DC | PRN

## 2018-08-13 MED ORDER — ANGIOPLASTY BOOK
Freq: Once | Status: AC
  Administered 2018-08-14: 02:00:00 1

## 2018-08-13 MED ORDER — MORPHINE SULFATE (PF) 4 MG/ML IV SOLN
4.0000 mg | Freq: Once | INTRAVENOUS | Status: AC
  Administered 2018-08-13: 4 mg via INTRAVENOUS
  Filled 2018-08-13: qty 1

## 2018-08-13 MED ORDER — CLONIDINE HCL 0.1 MG PO TABS
0.1000 mg | ORAL_TABLET | Freq: Once | ORAL | Status: AC
  Administered 2018-08-13: 0.1 mg via ORAL
  Filled 2018-08-13: qty 1

## 2018-08-13 MED ORDER — VERAPAMIL HCL 2.5 MG/ML IV SOLN
INTRAVENOUS | Status: DC | PRN
  Administered 2018-08-13: 8 mL via INTRA_ARTERIAL

## 2018-08-13 MED ORDER — LABETALOL HCL 5 MG/ML IV SOLN: 10.0000 mg | INTRAVENOUS | Status: AC | PRN

## 2018-08-13 MED ORDER — TEMAZEPAM 15 MG PO CAPS
15.0000 mg | ORAL_CAPSULE | Freq: Every day | ORAL | Status: DC
  Administered 2018-08-13: 15 mg via ORAL
  Filled 2018-08-13: qty 1

## 2018-08-13 MED ORDER — FENTANYL CITRATE (PF) 100 MCG/2ML IJ SOLN
INTRAMUSCULAR | Status: DC | PRN
  Administered 2018-08-13: 25 ug via INTRAVENOUS

## 2018-08-13 MED ORDER — IOHEXOL 350 MG/ML SOLN
100.0000 mL | Freq: Once | INTRAVENOUS | Status: AC | PRN
  Administered 2018-08-13: 100 mL via INTRAVENOUS

## 2018-08-13 MED ORDER — OXYCODONE HCL 5 MG PO TABS: 5.0000 mg | ORAL_TABLET | ORAL | Status: DC | PRN

## 2018-08-13 MED ORDER — TICAGRELOR 90 MG PO TABS
ORAL_TABLET | ORAL | Status: AC
  Filled 2018-08-13: qty 2

## 2018-08-13 MED ORDER — NITROGLYCERIN 1 MG/10 ML FOR IR/CATH LAB
INTRA_ARTERIAL | Status: DC | PRN
  Administered 2018-08-13: 100 ug via INTRACORONARY

## 2018-08-13 MED ORDER — HYDROCHLOROTHIAZIDE 25 MG PO TABS
25.0000 mg | ORAL_TABLET | Freq: Every day | ORAL | Status: DC
  Administered 2018-08-13: 25 mg via ORAL
  Filled 2018-08-13: qty 1

## 2018-08-13 MED ORDER — AMLODIPINE BESYLATE 5 MG PO TABS
5.0000 mg | ORAL_TABLET | Freq: Every day | ORAL | Status: DC
  Administered 2018-08-13: 5 mg via ORAL
  Filled 2018-08-13: qty 1

## 2018-08-13 MED ORDER — ASPIRIN 81 MG PO CHEW
CHEWABLE_TABLET | ORAL | Status: AC
  Filled 2018-08-13: qty 4

## 2018-08-13 MED ORDER — TICAGRELOR 90 MG PO TABS
ORAL_TABLET | ORAL | Status: DC | PRN
  Administered 2018-08-13: 180 mg via ORAL

## 2018-08-13 MED ORDER — IRBESARTAN 300 MG PO TABS
300.0000 mg | ORAL_TABLET | Freq: Every day | ORAL | Status: DC
  Administered 2018-08-13: 300 mg via ORAL
  Filled 2018-08-13 (×4): qty 1

## 2018-08-13 MED ORDER — INSULIN ASPART 100 UNIT/ML ~~LOC~~ SOLN: 0.0000 [IU] | Freq: Three times a day (TID) | SUBCUTANEOUS | Status: DC

## 2018-08-13 MED ORDER — INSULIN GLARGINE 100 UNIT/ML ~~LOC~~ SOLN
28.0000 [IU] | Freq: Every day | SUBCUTANEOUS | Status: DC
  Administered 2018-08-13: 22:00:00 28 [IU] via SUBCUTANEOUS
  Filled 2018-08-13: qty 0.28

## 2018-08-13 MED ORDER — HYDRALAZINE HCL 20 MG/ML IJ SOLN: 5.0000 mg | INTRAMUSCULAR | Status: AC | PRN

## 2018-08-13 MED ORDER — HEPARIN (PORCINE) IN NACL 1000-0.9 UT/500ML-% IV SOLN
INTRAVENOUS | Status: DC | PRN
  Administered 2018-08-13: 500 mL

## 2018-08-13 MED ORDER — NITROGLYCERIN 1 MG/10 ML FOR IR/CATH LAB
INTRA_ARTERIAL | Status: AC
  Filled 2018-08-13: qty 10

## 2018-08-13 MED ORDER — ASPIRIN 81 MG PO CHEW: 81.0000 mg | CHEWABLE_TABLET | ORAL | Status: DC

## 2018-08-13 MED ORDER — MIDAZOLAM HCL 2 MG/2ML IJ SOLN
INTRAMUSCULAR | Status: DC | PRN
  Administered 2018-08-13: 2 mg via INTRAVENOUS

## 2018-08-13 MED ORDER — HEPARIN (PORCINE) IN NACL 1000-0.9 UT/500ML-% IV SOLN
INTRAVENOUS | Status: AC
  Filled 2018-08-13: qty 1000

## 2018-08-13 MED ORDER — PANTOPRAZOLE SODIUM 40 MG PO TBEC
40.0000 mg | DELAYED_RELEASE_TABLET | Freq: Every day | ORAL | Status: DC
  Administered 2018-08-14: 40 mg via ORAL
  Filled 2018-08-13: qty 1

## 2018-08-13 MED ORDER — LIDOCAINE HCL (PF) 1 % IJ SOLN
INTRAMUSCULAR | Status: AC
  Filled 2018-08-13: qty 30

## 2018-08-13 MED ORDER — SODIUM CHLORIDE 0.9% FLUSH: 3.0000 mL | Freq: Two times a day (BID) | INTRAVENOUS | Status: DC

## 2018-08-13 MED ORDER — NITROGLYCERIN IN D5W 200-5 MCG/ML-% IV SOLN
0.0000 ug/min | INTRAVENOUS | Status: DC
  Administered 2018-08-13: 5 ug/min via INTRAVENOUS
  Filled 2018-08-13: qty 250

## 2018-08-13 MED ORDER — OLMESARTAN-AMLODIPINE-HCTZ 40-5-25 MG PO TABS: 1.0000 | ORAL_TABLET | Freq: Every day | ORAL | Status: DC

## 2018-08-13 MED ORDER — SODIUM CHLORIDE 0.9 % IV SOLN: 250.0000 mL | INTRAVENOUS | Status: DC | PRN

## 2018-08-13 MED ORDER — METOPROLOL SUCCINATE ER 50 MG PO TB24: 75.0000 mg | ORAL_TABLET | Freq: Every day | ORAL | Status: DC

## 2018-08-13 MED ORDER — ONDANSETRON HCL 4 MG/2ML IJ SOLN
4.0000 mg | Freq: Once | INTRAMUSCULAR | Status: AC
  Administered 2018-08-13: 4 mg via INTRAVENOUS
  Filled 2018-08-13: qty 2

## 2018-08-13 MED ORDER — MORPHINE SULFATE (PF) 2 MG/ML IV SOLN: 2.0000 mg | INTRAVENOUS | Status: DC | PRN

## 2018-08-13 MED ORDER — MIDAZOLAM HCL 2 MG/2ML IJ SOLN
INTRAMUSCULAR | Status: AC
  Filled 2018-08-13: qty 2

## 2018-08-13 SURGICAL SUPPLY — 18 items
BALLN SAPPHIRE 2.5X15 (BALLOONS) ×2
BALLN ~~LOC~~ EMERGE MR 3.75X12 (BALLOONS) ×2
BALLOON SAPPHIRE 2.5X15 (BALLOONS) ×1 IMPLANT
BALLOON ~~LOC~~ EMERGE MR 3.75X12 (BALLOONS) ×1 IMPLANT
CATH INFINITI 5FR MULTPACK ANG (CATHETERS) ×2 IMPLANT
CATH VISTA GUIDE 6FR XB4 (CATHETERS) ×2 IMPLANT
DEVICE RAD COMP TR BAND LRG (VASCULAR PRODUCTS) ×2 IMPLANT
ELECT DEFIB PAD ADLT CADENCE (PAD) ×2 IMPLANT
GLIDESHEATH SLEND SS 6F .021 (SHEATH) ×2 IMPLANT
GUIDEWIRE INQWIRE 1.5J.035X260 (WIRE) ×1 IMPLANT
INQWIRE 1.5J .035X260CM (WIRE) ×2
KIT ENCORE 26 ADVANTAGE (KITS) ×2 IMPLANT
KIT HEART LEFT (KITS) ×2 IMPLANT
PACK CARDIAC CATHETERIZATION (CUSTOM PROCEDURE TRAY) ×2 IMPLANT
STENT SYNERGY DES 3.5X16 (Permanent Stent) ×2 IMPLANT
TRANSDUCER W/STOPCOCK (MISCELLANEOUS) ×2 IMPLANT
TUBING CIL FLEX 10 FLL-RA (TUBING) ×2 IMPLANT
WIRE COUGAR XT STRL 190CM (WIRE) ×2 IMPLANT

## 2018-08-13 NOTE — ED Triage Notes (Signed)
Pt reports he has been unable to keep his daily medications down today, which include blood pressure and diabetic medications.

## 2018-08-13 NOTE — Interval H&P Note (Signed)
  Cath Lab Visit (complete for each Cath Lab visit)  Clinical Evaluation Leading to the Procedure:   ACS: Yes.    Non-ACS:    Anginal Classification: CCS IV  Anti-ischemic medical therapy: Minimal Therapy (1 class of medications)  Non-Invasive Test Results: No non-invasive testing performed  Prior CABG: No previous CABG      History and Physical Interval Note:  08/13/2018 6:40 PM  Russell Morrow  has presented today for surgery, with the diagnosis of non stemi.  The various methods of treatment have been discussed with the patient and family. After consideration of risks, benefits and other options for treatment, the patient has consented to  Procedure(s): LEFT HEART CATH AND CORONARY ANGIOGRAPHY (N/A) as a surgical intervention.  The patient's history has been reviewed, patient examined, no change in status, stable for surgery.  I have reviewed the patient's chart and labs.  Questions were answered to the patient's satisfaction.     Tonny Bollman

## 2018-08-13 NOTE — ED Notes (Signed)
[facility] at bedside 

## 2018-08-13 NOTE — ED Provider Notes (Signed)
[facility] EMERGENCY DEPARTMENT Provider Note   CSN: [id number] Arrival date & time: 08/13/18  1232    History   Chief Complaint Chief Complaint  Patient presents with  . Abdominal Pain    HPI Russell Morrow is a 68 y.o. male.  He has had intermittent abdominal pain mostly upper but sometimes left lower quadrant it is been going on and off for over a year.  More recently he is getting it every other day and is associated with nausea and dry heaves.  He was recently admitted 2 weeks ago for same and had an unremarkable CT.  No fevers or chills no cough no shortness of breath.  No vomiting of blood or passing blood from his bottom.  Says his bowels have been regular although was constipated a few days ago.  No urinary symptoms.  No radiation of the pain through to the back.  He said he has not been able to hold down his medications and that is why his blood pressure is up.     The history is provided by the patient.  Abdominal Pain  Pain location:  Epigastric Pain quality: aching   Pain radiates to:  Does not radiate Pain severity:  Severe Onset quality:  Gradual Duration:  1 day Timing:  Constant Progression:  Waxing and waning Chronicity:  Recurrent Context: laxative use and retching   Context: not previous surgeries, not recent travel, not sick contacts, not suspicious food intake and not trauma   Relieved by:  Nothing Worsened by:  Nothing Ineffective treatments:  Bowel activity Associated symptoms: chills, constipation, nausea and vomiting   Associated symptoms: no belching, no chest pain, no cough, no diarrhea, no dysuria, no fever, no hematemesis, no hematochezia, no hematuria, no melena, no shortness of breath and no sore throat   Risk factors: recent hospitalization     Past Medical History:  Diagnosis Date  . CAD (coronary artery disease)    a. mild nonobstructive CAD by cath in 2010 b. NST in 11/2015 showing a fixed defect with no ischemia and Coronary CT showing  nonobstructive CAD along LAD and LCx  . Diabetes mellitus without complication (HCC)   . Diverticulitis   . Hypertension     Patient Active Problem List   Diagnosis Date Noted  . Chest pain in adult 07/29/2018  . Atrial tachycardia, paroxysmal (HCC) 07/29/2018  . Atrial tachycardia (HCC)   . Hypokalemia 07/28/2018  . Hypophosphatemia 07/28/2018  . Leukocytosis 07/28/2018  . Type 2 diabetes mellitus (HCC) 07/28/2018  . Umbilical hernia 04/17/2017  . Inguinal hernia, bilateral 04/17/2017  . Cholelithiasis 04/17/2017  . Abdominal pain, epigastric 04/17/2017  . Lesion of both native kidneys 04/17/2017  . Fatty liver 04/17/2017  . Hypertension     Past Surgical History:  Procedure Laterality Date  . CERVICAL FUSION    . nasal septum repair          Home Medications    Prior to Admission medications   Medication Sig Start Date End Date Taking? Authorizing Provider  acetaminophen (TYLENOL) 325 MG tablet Take 650 mg by mouth every 6 (six) hours as needed for mild pain or moderate pain.    [provider]  cloNIDine (CATAPRES) 0.2 MG tablet Take 0.2 mg by mouth 2 (two) times daily.    [provider]  diphenhydrAMINE (BENADRYL) 25 mg capsule Take 25 mg by mouth at bedtime.    [provider]  empagliflozin (JARDIANCE) 10 MG TABS tablet Take 10 mg  by mouth daily.    [provider]  Esomeprazole Magnesium 20 MG TBEC Take 20 mg by mouth 2 (two) times daily before a meal.     [provider]  insulin glargine (LANTUS) 100 UNIT/ML injection Inject 28 Units into the skin at bedtime.     [provider]  Liraglutide (VICTOZA [place]) Inject 1.8 mLs into the skin at bedtime.     [provider]  metFORMIN (GLUCOPHAGE) 1000 MG tablet Take 1,000 mg by mouth 2 (two) times daily with a meal.    [provider]  metoprolol succinate (TOPROL-XL) 25 MG 24 hr tablet Take 3 tablets (75 mg total) by mouth daily. 07/31/18   Shon Hale, MD  Multiple Vitamin (MULTIVITAMIN WITH MINERALS) TABS tablet Take 1 tablet by mouth daily. 07/31/18   Shon Hale, MD  Olmesartan-amLODIPine-HCTZ 40-5-25 MG TABS Take 1 tablet by mouth daily. 07/24/18   [provider]  potassium chloride SA (K-DUR,KLOR-CON) 20 MEQ tablet Take 2 tablets (40 mEq total) by mouth daily. Patient taking differently: Take 20 mEq by mouth daily.  12/31/16   [name], MD  rOPINIRole (REQUIP) 1 MG tablet Take 2 mg by mouth at bedtime. 07/07/18   [provider]  temazepam (RESTORIL) 15 MG capsule Take 15 mg by mouth at bedtime. 07/07/18   [provider]    Family History Family History  Problem Relation Age of Onset  . Cancer Mother        breast  . Hypertension Mother   . Diabetes Father   . Heart disease Father   . Hypertension Father   . Diabetes Brother   . Heart disease Brother   . Hypertension Brother   . Colon cancer Neg Hx     Social History Social History   Tobacco Use  . Smoking status: Never Smoker  . Smokeless tobacco: Never Used  Substance Use Topics  . Alcohol use: Not Currently    Alcohol/week: 14.0 standard drinks    Types: 14 Glasses of wine per week    Comment: wine usually daily  . Drug use: Yes    Types: Marijuana     Allergies   Statins   Review of Systems Review of Systems  Constitutional: Positive for chills. Negative for fever.  HENT: Negative for sore throat.   Eyes: Negative for visual disturbance.  Respiratory: Negative for cough and shortness of breath.   Cardiovascular: Negative for chest pain.  Gastrointestinal: Positive for abdominal pain, constipation, nausea and vomiting. Negative for anal bleeding, blood in stool, diarrhea, hematemesis, hematochezia, melena and rectal pain.  Genitourinary: Negative for dysuria and hematuria.  Musculoskeletal: Negative for neck pain.  Skin: Negative for rash.  Neurological: Negative for headaches.     Physical  Exam Updated Vital Signs BP (!) 204/125 (BP Location: Left Arm)   Pulse 61   Temp 98.1 F (36.7 C) (Oral)   Resp 16   Ht 6\' 1"  (1.854 m)   Wt 99.8 kg   SpO2 100%   BMI 29.03 kg/m   Physical Exam Vitals signs and nursing note reviewed.  Constitutional:      Appearance: He is well-developed.  HENT:     Head: Normocephalic and atraumatic.  Eyes:     Conjunctiva/sclera: Conjunctivae normal.  Neck:     Musculoskeletal: Neck supple.  Cardiovascular:     Rate and Rhythm: Normal rate and regular rhythm.     Heart sounds: No murmur.  Pulmonary:     Effort:  Pulmonary effort is normal. No respiratory distress.     Breath sounds: Normal breath sounds.  Abdominal:     Palpations: Abdomen is soft. There is no mass.     Tenderness: There is abdominal tenderness in the epigastric area. There is no guarding or rebound.  Skin:    General: Skin is warm and dry.     Capillary Refill: Capillary refill takes less than 2 seconds.  Neurological:     General: No focal deficit present.     Mental Status: He is alert and oriented to person, place, and time.     Motor: No weakness.      ED Treatments / Results  Labs (all labs ordered are listed, but only abnormal results are displayed) Labs Reviewed  COMPREHENSIVE METABOLIC PANEL - Abnormal; Notable for the following components:      Result Value   Chloride 95 (*)    Glucose, Bld 200 (*)    All other components within normal limits  CBC - Abnormal; Notable for the following components:   WBC 11.3 (*)    RBC 6.04 (*)    All other components within normal limits  LIPASE, BLOOD  URINALYSIS, ROUTINE W REFLEX MICROSCOPIC  TROPONIN I  MAGNESIUM    EKG EKG Interpretation  Date/Time:  Friday August 13 2018 14:45:06 EST Ventricular Rate:  91 PR Interval:    QRS Duration: 90 QT Interval:  371 QTC Calculation: 457 R Axis:   -84 Text Interpretation:  Sinus arrhythmia Left anterior fascicular block Consider right ventricular hypertrophy  similar pattern to prior 2/20 Confirmed by Meridee Score [phone]) on 08/13/2018 3:02:53 PM   Radiology Ct Angio Abd/pel W And/or Wo Contrast  Result Date: 08/13/2018 CLINICAL DATA:  68 year old with severe upper abdominal pain. Evaluate for mesenteric ischemia. EXAM: CT ANGIOGRAPHY ABDOMEN AND PELVIS WITH CONTRAST AND WITHOUT CONTRAST TECHNIQUE: Multidetector CT imaging of the abdomen and pelvis was performed using the standard protocol during bolus administration of intravenous contrast. Multiplanar reconstructed images and MIPs were obtained and reviewed to evaluate the vascular anatomy. CONTRAST:  OMNIPAQUE IOHEXOL 350 MG/ML SOLN COMPARISON:  CT of the abdomen pelvis 07/28/2018 12/31/2016 FINDINGS: VASCULAR Aorta: Mild atherosclerotic disease in the distal abdominal aorta without aneurysm or dissection. Proximal abdominal aorta is ectatic measuring 2.9 cm. Celiac: Patent without evidence of aneurysm, dissection, vasculitis or significant stenosis. Incidentally, there is a replaced left hepatic artery originating from the left gastric artery. SMA: Patent without evidence of aneurysm, dissection, vasculitis or significant stenosis. Renals: Both renal arteries are patent without evidence of aneurysm, dissection, vasculitis, fibromuscular dysplasia or significant stenosis. Small accessory right renal artery. IMA: Patent without evidence of aneurysm, dissection, vasculitis or significant stenosis. Inflow: Mild atherosclerotic disease in the iliac arteries. The common, internal and external iliac arteries are patent bilaterally. Slightly prominent noncalcified plaque in the proximal right internal iliac artery. Proximal Outflow: Proximal femoral arteries are patent bilaterally. Veins: Portal venous system is patent. No gross abnormality to the iliac veins, IVC or renal veins. Hepatic veins are patent. Review of the MIP images confirms the above findings. NON-VASCULAR Lower chest: Focal density of the right  middle lobe base on sequence 6 image 13 is unchanged since 2018. Stable small nodule in the left lower lobe on sequence 3, image 2018 is unchanged since 2018. No large pleural effusions. Few densities at the base of the lingula and the base of the left major fissure are similar to the exam in 2018. Hepatobiliary: Normal appearance of the liver,  gallbladder and portal venous system. No biliary dilatation. Pancreas: Unremarkable. No pancreatic ductal dilatation or surrounding inflammatory changes. Spleen: Normal in size without focal abnormality. Adrenals/Urinary Tract: Normal adrenal glands. There are multiple small low-density structures in both kidneys that are suggestive for cysts. However, there is an exophytic dense structure along the anterior right kidney that measures 1.5 x 1.5 x 1.6 cm. This exophytic structure is concerning for a small enhancing lesion in the right kidney. In addition, there is a indeterminate lesion in the posterior left kidney measuring 1.5 cm on sequence 11, image 46. Normal appearance of the urinary bladder. No hydronephrosis. No evidence for kidney stones. Stomach/Bowel: Stomach is within normal limits. No evidence of bowel wall thickening, distention, or inflammatory changes. There appears to be a small normal appearing appendix. Diverticulosis in the sigmoid colon without acute inflammation. Lymphatic: No lymph node enlargement in the abdomen or pelvis. Reproductive: Prostate is unremarkable. Other: No ascites. Negative for free air. Again noted is a left inguinal hernia containing fat. Musculoskeletal: Disc space loss with vacuum disc at L4-L5. IMPRESSION: VASCULAR 1. No acute vascular abnormality. 2. Mild atherosclerotic disease in the aorta but the visceral arteries are widely patent. NON-VASCULAR 1. No acute abnormality in the abdomen or pelvis. No evidence to suggest mesenteric ischemia. 2. **An incidental finding of potential clinical significance has been found. Indeterminate  renal lesions are again noted. 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 Recommend further characterization with an MRI, pre and post contrast. ** 3. Left inguinal hernia. 4. Small nodular densities at the lung bases have not significantly changed since 2018 and likely benign. These results were called by telephone at the time of interpretation on 08/13/2018 at 4:13 pm to Dr. Meridee Score , who verbally acknowledged these results. Electronically Signed   By: Richarda Overlie M.D.   On: 08/13/2018 16:18    Procedures .Critical Care Performed by: Terrilee Files, MD Authorized by: Terrilee Files, MD   Critical care provider statement:    Critical care time (minutes):  45   Critical care time was exclusive of:  Separately billable procedures and treating other patients   Critical care was necessary to treat or prevent imminent or life-threatening deterioration of the following conditions:  Cardiac failure   Critical care was time spent personally by me on the following activities:  Discussions with consultants, evaluation of patient's response to treatment, examination of patient, ordering and performing treatments and interventions, ordering and review of laboratory studies, ordering and review of radiographic studies, pulse oximetry, re-evaluation of patient's condition, obtaining history from patient or surrogate, review of old charts and development of treatment plan with patient or surrogate   I assumed direction of critical care for this patient from another provider in my specialty: no     (including critical care time)  Medications Ordered in ED Medications  morphine 4 MG/ML injection 4 mg (has no administration in time range)  ondansetron (ZOFRAN) injection 4 mg (has no administration in time range)  sodium chloride 0.9 % bolus 1,000 mL (has no administration in time range)     Initial Impression /  Assessment and Plan / ED Course  I have reviewed the triage vital signs and the nursing notes.  Pertinent labs & imaging results that were available during my care of the patient were reviewed by me and considered in my medical decision making (see chart for details).  Clinical  Course as of Aug 14 1031  Fri Aug 13, 2018  1600 Patient's troponin is markedly elevated here at 0.47.  I reviewed this with Dr. Wyline Mood who is recommending a nitroglycerin drip to treat this is a hypertensive urgency and he will evaluate the patient in the department.  Anticipated admission.   [MB]  1606 He has a history of nonobstructive cardiac disease from a cath done 10 years ago.  I have updated the patient on the plan for a nitro drip and admission and he is agreeable to stay.  He is CT NGO abdomen and pelvis is not resulted yet.  Placed a call into the hospitalist   [MB]  1611 Discussed with Dr. Allena Katz from hospitalist service who will evaluate the patient for admission.  Also received a call from radiology is CT Angie does not show any significant vascular disease to explain the patient's pain.  Of note the radiologist said he did see a right kidney renal lesion that has been seen on prior studies and will need further work-up including an MRI with and without contrast as it is possible that this is a renal cell carcinoma.   [MB]    Clinical Course User Index [MB] Terrilee Files, MD        Final Clinical Impressions(s) / ED Diagnoses   Final diagnoses:  NSTEMI (non-ST elevated myocardial infarction) [facility])  Hypertensive emergency  Epigastric pain    ED Discharge Orders    None       Terrilee Files, MD 08/14/18 1035

## 2018-08-13 NOTE — Progress Notes (Signed)
TR BAND REMOVAL  LOCATION:    right radial  DEFLATED PER PROTOCOL:    Yes.    TIME BAND OFF / DRESSING APPLIED:    23:15   SITE UPON ARRIVAL:    Level 0  SITE AFTER BAND REMOVAL:    Level 0  CIRCULATION SENSATION AND MOVEMENT:    Within Normal Limits   Yes.    COMMENTS:   Post TR band instructions given. Pt tolerated well. 

## 2018-08-13 NOTE — ED Triage Notes (Signed)
Pt c/o "severe" upper abdominal pain, nausea, vomiting and chills that started this morning. Denies diarrhea.

## 2018-08-13 NOTE — ED Notes (Signed)
Dr Branch at bedside. ?

## 2018-08-13 NOTE — Progress Notes (Signed)
ANTICOAGULATION CONSULT NOTE - Initial Consult  Pharmacy Consult for heparin Indication: NSTEMI  Allergies  Allergen Reactions  . Statins     Muscle aches, liver pain    Patient Measurements: Height: 6\' 1"  (185.4 cm) Weight: 220 lb (99.8 kg) IBW/kg (Calculated) : 79.9 Heparin Dosing Weight: 99 kg  Vital Signs: Temp: 98.1 F (36.7 C) (03/06 1242) Temp Source: Oral (03/06 1242) BP: 134/95 (03/06 1645) Pulse Rate: 91 (03/06 1645)  Labs: Recent Labs    08/13/18 1331 08/13/18 1501  HGB 17.0  --   HCT 50.3  --   PLT 330  --   CREATININE 1.00  --   TROPONINI  --  0.47*    Estimated Creatinine Clearance: 86.7 mL/min (by C-G formula based on SCr of 1 mg/dL).   Medical History: Past Medical History:  Diagnosis Date  . CAD (coronary artery disease)    a. mild nonobstructive CAD by cath in 2010 b. NST in 11/2015 showing a fixed defect with no ischemia and Coronary CT showing nonobstructive CAD along LAD and LCx  . Diabetes mellitus without complication (HCC)   . Diverticulitis   . Hypertension     Medications:  (Not in a hospital admission)   Assessment: Pharmacy consulted to dose heparin in patient with possible NSTEMI.  Patient is not on anticoagulation prior to admission.  Goal of Therapy:  Heparin level 0.3-0.7 units/ml Monitor platelets by anticoagulation protocol: Yes   Plan:  Give 4000 units bolus x 1 Start heparin infusion at 1300 units/hr Check anti-Xa level in 6-8 hours and daily while on heparin Continue to monitor H&H and platelets  Salvatore Decent Aurthur Wingerter 08/13/2018,5:00 PM

## 2018-08-13 NOTE — H&P (Addendum)
Cardiology History and Physical    Patient ID: Russell Morrow; [id number]; 1950/08/11   Admit date: 08/13/2018 Date of Admit: 08/13/2018  Primary Care Provider: Benita Stabile, MD Primary Cardiologist: Charlton Haws, MD   Patient Profile    Russell Morrow is a 68 y.o. male with past medical history of CAD (mild nonobstructive CAD by cath in 2010 with NST in 11/2015 showing a fixed defect with no ischemia and Coronary CT showing nonobstructive CAD along LAD and LCx), HTN, Type 2 DM, and family history of CAD who is being seen today for the evaluation of elevated troponin values at the request of Dr. Charm Barges.   History of Present Illness    [name] was most recently admitted to [facility] from 07/28/2018 - 07/30/2018 for evaluation of abdominal pain, nausea, and vomiting. He was found to have cholelithiasis but no acute abnormalities. Cardiology was consulted during admission as he was noted to have frequent ectopy including PACs, PVCs, and atrial tachycardia. He did have an episode of wide-complex tachycardia lasting for approximately 1 minute which was reviewed with EP and thought to be most consistent with SVT with aberrancy as opposed to VT.  Troponin values peaked at 0.13 that admission and an echocardiogram showed a preserved EF of 60 to 65% with no regional wall motion abnormalities. Toprol-XL was titrated to 75 mg daily at the time of discharge no further ischemic evaluation was pursued.  He presented back to [facility] ED earlier today for evaluation of sternal discomfort, nausea, and vomiting. He reports having worsening dyspnea on exertion over the past week but starting this morning he developed a discomfort along his sternal region which had been present since onset. He reports associated nausea with this. He did take his Toprol-XL but did not take any of his other medications this morning due to concerns of being able to keep them down. He reports associated diaphoresis and chills throughout the  day. No subjective fevers and temp 98.1 in the ED.   Labs show WBC 11.3, Hgb 17.0, platelets 330, Na+ 136, K+ 3.7, and creatinine 1.00. LFT's within normal limits. Lipase 29. Mg 1.9. Initial Troponin 0.47. CT Abdomen showed no acute abnormalities but was noted to have indeterminate renal lesions measuring up to 1.6 cm with MRI recommended to evaluate for potential renal cell carcinoma. EKG shows NSR, HR 91, with PAC's, with no acute ST abnormalities.   BP was initially elevated to 189/138 due to not taking all of his AM medications, now improved to 134/95 following administration of these. He also received Dilaudid and Morphine and was still having 5/10 chest pain. Was started on NTG drip and pain has improved but still 3-4/10.  Past Medical History:  Diagnosis Date  . CAD (coronary artery disease)    a. mild nonobstructive CAD by cath in 2010 b. NST in 11/2015 showing a fixed defect with no ischemia and Coronary CT showing nonobstructive CAD along LAD and LCx  . Diabetes mellitus without complication (HCC)   . Diverticulitis   . Hypertension     Past Surgical History:  Procedure Laterality Date  . CERVICAL FUSION    . nasal septum repair       Home Medications:  Prior to Admission medications   Medication Sig Start Date End Date Taking? Authorizing Provider  acetaminophen (TYLENOL) 325 MG tablet Take 650 mg by mouth every 6 (six) hours as needed for mild pain or moderate pain.   Yes [provider]  cloNIDine (CATAPRES) 0.2 MG tablet Take 0.2 mg by mouth 2 (two) times daily.   Yes [provider]  diphenhydrAMINE (BENADRYL) 25 mg capsule Take 25 mg by mouth at bedtime.   Yes [provider]  empagliflozin (JARDIANCE) 10 MG TABS tablet Take 10 mg by mouth daily.   Yes [provider]  insulin glargine (LANTUS) 100 UNIT/ML injection Inject 28 Units into the skin at bedtime.    Yes [provider]  Liraglutide (VICTOZA [place]) Inject 1.8 mLs into  the skin at bedtime.    Yes [provider]  metFORMIN (GLUCOPHAGE) 1000 MG tablet Take 1,000 mg by mouth 2 (two) times daily with a meal.   Yes [provider]  metoprolol succinate (TOPROL-XL) 25 MG 24 hr tablet Take 3 tablets (75 mg total) by mouth daily. 07/31/18  Yes Emokpae, Courage, MD  Olmesartan-amLODIPine-HCTZ 40-5-25 MG TABS Take 1 tablet by mouth daily. 07/24/18  Yes [provider]  ondansetron (ZOFRAN) 4 MG tablet TAKE 1 TABLET BY MOUTH EVERY 6 HOURS AS NEEDED NAUSEA 08/02/18  Yes [provider]  pantoprazole (PROTONIX) 40 MG tablet Take 40 mg by mouth daily. 08/02/18  Yes [provider]  potassium chloride SA (K-DUR,KLOR-CON) 20 MEQ tablet Take 2 tablets (40 mEq total) by mouth daily. Patient taking differently: Take 20 mEq by mouth daily.  12/31/16  Yes [name], MD  rOPINIRole (REQUIP) 1 MG tablet Take 2 mg by mouth at bedtime. 07/07/18  Yes [provider]  temazepam (RESTORIL) 15 MG capsule Take 15 mg by mouth at bedtime. 07/07/18  Yes [provider]  Esomeprazole Magnesium 20 MG TBEC Take 20 mg by mouth 2 (two) times daily before a meal.     [provider]    Inpatient Medications: Scheduled Meds: . amLODipine  5 mg Oral Daily   And  . hydrochlorothiazide  25 mg Oral Daily  . irbesartan  300 mg Oral Daily   Continuous Infusions: . nitroGLYCERIN     PRN Meds:   Allergies:    Allergies  Allergen Reactions  . Statins     Muscle aches, liver pain    Social History:   Social History   Socioeconomic History  . Marital status: Married    Spouse name: Not on file  . Number of children: Not on file  . Years of education: Not on file  . Highest education level: Not on file  Occupational History  . Not on file  Social Needs  . Financial resource strain: Not on file  . Food insecurity:    Worry: Not on file    Inability: Not on file  . Transportation needs:    Medical: Not on file     Non-medical: Not on file  Tobacco Use  . Smoking status: Never Smoker  . Smokeless tobacco: Never Used  Substance and Sexual Activity  . Alcohol use: Not Currently    Alcohol/week: 14.0 standard drinks    Types: 14 Glasses of wine per week    Comment: wine usually daily  . Drug use: Yes    Types: Marijuana  . Sexual activity: Not on file  Lifestyle  . Physical activity:    Days per week: Not on file    Minutes per session: Not on file  . Stress: Not on file  Relationships  . Social connections:    Talks on phone: Not on file    Gets together: Not on file    Attends religious service:  Not on file    Active member of club or organization: Not on file    Attends meetings of clubs or organizations: Not on file    Relationship status: Not on file  . Intimate partner violence:    Fear of current or ex partner: Not on file    Emotionally abused: Not on file    Physically abused: Not on file    Forced sexual activity: Not on file  Other Topics Concern  . Not on file  Social History Narrative  . Not on file     Family History:    Family History  Problem Relation Age of Onset  . Cancer Mother        breast  . Hypertension Mother   . Diabetes Father   . Heart disease Father   . Hypertension Father   . Diabetes Brother   . Heart disease Brother   . Hypertension Brother   . Colon cancer Neg Hx       Review of Systems    General:  No chills, fever, night sweats or weight changes.  Cardiovascular:  No edema, orthopnea, palpitations, paroxysmal nocturnal dyspnea. Positive for chest pain and dyspnea on exertion.  Dermatological: No rash, lesions/masses Respiratory: No cough, dyspnea Urologic: No hematuria, dysuria Abdominal:   No nausea, vomiting, diarrhea, bright red blood per rectum, melena, or hematemesis Neurologic:  No visual changes, wkns, changes in mental status. All other systems reviewed and are otherwise negative except as noted above.  Physical Exam/Data     Vitals:   08/13/18 1245 08/13/18 1501 08/13/18 1503 08/13/18 1530  BP: (!) 204/125 (!) 201/132  (!) 201/111  Pulse: 61 (!) 167 82 (!) 130  Resp:  13  15  Temp:      TempSrc:      SpO2: 100% 96%  97%  Weight:      Height:       No intake or output data in the 24 hours ending 08/13/18 1614 Filed Weights   08/13/18 1240  Weight: 99.8 kg   Body mass index is 29.03 kg/m.   General: Pleasant, Caucasian male appearing in NAD Psych: Normal affect. Neuro: Alert and oriented X 3. Moves all extremities spontaneously. HEENT: Normal  Neck: Supple without bruits or JVD. Lungs:  Resp regular and unlabored, CTA without wheezing or rales. Heart: RRR no s3, s4, or murmurs. Abdomen: Soft, non-tender, non-distended, BS + x 4.  Extremities: No clubbing, cyanosis or edema. DP/PT/Radials 2+ and equal bilaterally.  Labs/Studies     Relevant CV Studies:  Echocardiogram: 07/29/2018 IMPRESSIONS   1. The left ventricle has normal systolic function with an ejection fraction of 60-65%. The cavity size was normal. There is moderately increased left ventricular wall thickness. Left ventricular diastolic Doppler parameters are consistent with impaired  relaxation.  2. The right ventricle has normal systolic function. The cavity was normal. There is no increase in right ventricular wall thickness.  3. The mitral valve is normal in structure. No evidence of mitral valve stenosis.  4. The tricuspid valve is normal in structure.  5. The aortic valve has an indeterminant number of cusps Mild thickening of the aortic valve no stenosis of the aortic valve.  6. The aortic root is normal in size and structure.  7. There is mild dilatation of the ascending aorta measuring 38 mm.  8. The inferior vena cava was dilated in size with >50% respiratory variability.  Laboratory Data:  Chemistry Recent Labs  Lab 08/13/18 1331  NA 136  K 3.7  CL 95*  CO2 27  GLUCOSE 200*  BUN 18  CREATININE 1.00  CALCIUM  9.6  GFRNONAA >60  GFRAA >60  ANIONGAP 14    Recent Labs  Lab 08/13/18 1331  PROT 7.9  ALBUMIN 4.6  AST 18  ALT 32  ALKPHOS 57  BILITOT 1.1   Hematology Recent Labs  Lab 08/13/18 1331  WBC 11.3*  RBC 6.04*  HGB 17.0  HCT 50.3  MCV 83.3  MCH 28.1  MCHC 33.8  RDW 14.1  PLT 330   Cardiac Enzymes Recent Labs  Lab 08/13/18 1501  TROPONINI 0.47*   No results for input(s): TROPIPOC in the last 168 hours.  BNPNo results for input(s): BNP, PROBNP in the last 168 hours.  DDimer No results for input(s): DDIMER in the last 168 hours.  Radiology/Studies:  No results found.   Assessment & Plan    1. NSTEMI - He has a history of nonobstructive disease by cardiac catheterization in 2010 with NST in 2017 showing a fixed defect with no ischemia. He was recently admitted as outlined above for mostly abdominal discomfort and enzymes remained flat during admission. He presents with a new discomfort along his sternal region which has been present throughout the day. He also reports worsening dyspnea on exertion over the past week.   - Initial troponin found to be elevated to 0.47. EKG shows no acute ST changes. He has received Morphine and Dilaudid while also being started on nitroglycerin drip and is still having active pain at this time. Will review with Dr. Wyline Mood but would anticipate transfer to [facility] for a cardiac catheterization later today. The patient understands that risks include but are not limited to stroke (1 in 1000), death (1 in 1000), kidney failure [usually temporary] (1 in 500), bleeding (1 in 200), allergic reaction [possibly serious] (1 in 200).  Continue to cycle enzymes. Start Heparin and ASA  daily. Will continue BB therapy. Has been intolerant to statins in the past.   2. Accelerated HTN - BP initially elevated to 189/138 due to not taking all of his AM medications, now improved to 134/95. Will continue with PTA medications at time of admission.   3. Type  2 DM - He is on Lantus 28 units, Jardiance, Victoza, and Metformin as an outpatient. Will continue Lantus and hold his other anti-glycemics for now. Start SSI.  4. Frequent Ectopy - He has a known history of PACs, PVCs, and atrial tachycardia. Having occasional PVC's on telemetry. Continue Toprol-XL  daily.   5. Abnormal CT - CT Abdomen this admission shows no acute abnormalities but was noted to have indeterminate renal lesions measuring up to 1.6 cm with MRI recommended to evaluate for potential renal cell carcinoma.  - will need to follow-up on this as an outpatient.    For questions or updates, please contact [facility] Please consult [email or web address] for contact info under Cardiology/STEMI.  Signed, Ellsworth Lennox, PA-C 08/13/2018, 4:14 PM Pager: [phone]  Patient seen and disucssed with PA Iran Ouch, I agree with her documentation above. 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 Seen 07/2018 with N/V in hospital, was not able to keep his home av nodal agents down and had runs of atach including atach with aberrancy. Mild trop elevation at that time thought to be related to severe tachycardia. Felt well with rate control and discharged.  Presents with epigastric pain, constant  pain. Some nausea. Extensive workup for GI symptoms during 07/2018 overall benign. With trop elevation worrisome potential ishemia is the cause.    WBC 11.3 Hgb 17 Plt 330 K 3.7 Cr 1 Mg 1.9  Trop 0.47 CTA Abd/pelvics: no acute process. Renal lesion noted, may need additional imaging.  EKG SR, nonspecific ST/T changes  Presented with severe HTN, howver pain has not resolved with adequate bp control on NG drip, concern for NSTEMi as etiology of his epigastric pain. We will plan for transfer to [facility] for cath. If by chance normal cath would need to transfer to medicine service to further workup epigastric symptoms, also abnormal spot on  kidney from CTA that will need further evaluation at some point.   Dina Rich MD

## 2018-08-13 NOTE — ED Notes (Signed)
Date and time results received: 08/13/18  1550 (use smartphrase ".now" to insert current time)  Test: Troponin Critical Value: 0.47 Name of Provider Notified: Dr Charm Barges Orders Received? Or Actions Taken?: NA

## 2018-08-14 ENCOUNTER — Observation Stay (HOSPITAL_BASED_OUTPATIENT_CLINIC_OR_DEPARTMENT_OTHER): Payer: Medicare Other

## 2018-08-14 DIAGNOSIS — R079 Chest pain, unspecified: Secondary | ICD-10-CM

## 2018-08-14 DIAGNOSIS — E1169 Type 2 diabetes mellitus with other specified complication: Secondary | ICD-10-CM | POA: Diagnosis not present

## 2018-08-14 DIAGNOSIS — R1013 Epigastric pain: Secondary | ICD-10-CM | POA: Diagnosis not present

## 2018-08-14 DIAGNOSIS — I2510 Atherosclerotic heart disease of native coronary artery without angina pectoris: Secondary | ICD-10-CM | POA: Diagnosis not present

## 2018-08-14 DIAGNOSIS — Z794 Long term (current) use of insulin: Secondary | ICD-10-CM

## 2018-08-14 DIAGNOSIS — I214 Non-ST elevation (NSTEMI) myocardial infarction: Secondary | ICD-10-CM | POA: Diagnosis not present

## 2018-08-14 DIAGNOSIS — I161 Hypertensive emergency: Secondary | ICD-10-CM | POA: Diagnosis not present

## 2018-08-14 DIAGNOSIS — R7989 Other specified abnormal findings of blood chemistry: Secondary | ICD-10-CM | POA: Diagnosis not present

## 2018-08-14 LAB — BASIC METABOLIC PANEL
Anion gap: 10 (ref 5–15)
BUN: 14 mg/dL (ref 8–23)
CHLORIDE: 100 mmol/L (ref 98–111)
CO2: 26 mmol/L (ref 22–32)
Calcium: 8.6 mg/dL — ABNORMAL LOW (ref 8.9–10.3)
Creatinine, Ser: 0.82 mg/dL (ref 0.61–1.24)
GFR calc Af Amer: >60 mL/min (ref >60)
GFR calc non Af Amer: >60 mL/min (ref >60)
Glucose, Bld: 95 mg/dL (ref 70–99)
Potassium: 3 mmol/L — ABNORMAL LOW (ref 3.5–5.1)
Sodium: 136 mmol/L (ref 135–145)

## 2018-08-14 LAB — LIPID PANEL
Cholesterol: 143 mg/dL (ref 0–200)
HDL: 26 mg/dL — AB (ref >40)
LDL CALC: 99 mg/dL (ref 0–99)
Total CHOL/HDL Ratio: 5.5 RATIO
Triglycerides: 91 mg/dL (ref <150)
VLDL: 18 mg/dL (ref 0–40)

## 2018-08-14 LAB — ECHOCARDIOGRAM LIMITED
Height: 73 in
Weight: 3523.83 oz

## 2018-08-14 LAB — CBC
HEMATOCRIT: 41.8 % (ref 39.0–52.0)
Hemoglobin: 14.5 g/dL (ref 13.0–17.0)
MCH: 28.3 pg (ref 26.0–34.0)
MCHC: 34.7 g/dL (ref 30.0–36.0)
MCV: 81.6 fL (ref 80.0–100.0)
Platelets: 263 10*3/uL (ref 150–400)
RBC: 5.12 MIL/uL (ref 4.22–5.81)
RDW: 13.8 % (ref 11.5–15.5)
WBC: 10.9 10*3/uL — AB (ref 4.0–10.5)
nRBC: 0 % (ref 0.0–0.2)

## 2018-08-14 LAB — TROPONIN I
Troponin I: 1.36 ng/mL (ref <0.03)
Troponin I: 0.84 ng/mL (ref <0.03)

## 2018-08-14 LAB — GLUCOSE, CAPILLARY: Glucose-Capillary: 94 mg/dL (ref 70–99)

## 2018-08-14 MED ORDER — METFORMIN HCL 1000 MG PO TABS: 1000.0000 mg | ORAL_TABLET | Freq: Two times a day (BID) | ORAL | Status: DC

## 2018-08-14 MED ORDER — TICAGRELOR 90 MG PO TABS: 90.0000 mg | ORAL_TABLET | Freq: Two times a day (BID) | ORAL | 0 refills | Status: DC

## 2018-08-14 MED ORDER — TICAGRELOR 90 MG PO TABS: 90.0000 mg | ORAL_TABLET | Freq: Two times a day (BID) | ORAL | 3 refills | Status: DC

## 2018-08-14 MED ORDER — ASPIRIN 81 MG PO TBEC: 81.0000 mg | DELAYED_RELEASE_TABLET | Freq: Every day | ORAL | Status: AC

## 2018-08-14 MED ORDER — NITROGLYCERIN 0.4 MG SL SUBL: 0.4000 mg | SUBLINGUAL_TABLET | SUBLINGUAL | 2 refills | Status: DC | PRN

## 2018-08-14 MED ORDER — METOPROLOL SUCCINATE ER 50 MG PO TB24
50.0000 mg | ORAL_TABLET | Freq: Every day | ORAL | Status: DC
  Administered 2018-08-14: 50 mg via ORAL
  Filled 2018-08-14: qty 1

## 2018-08-14 MED ORDER — POTASSIUM CHLORIDE CRYS ER 20 MEQ PO TBCR
40.0000 meq | EXTENDED_RELEASE_TABLET | Freq: Once | ORAL | Status: AC
  Administered 2018-08-14: 07:00:00 40 meq via ORAL
  Filled 2018-08-14: qty 2

## 2018-08-14 NOTE — Progress Notes (Addendum)
Progress Note  Patient Name: Russell Morrow Date of Encounter: 08/14/2018  Primary Cardiologist: Charlton HawsPeter Nishan, MD   Subjective   Doing well this morning. No complaints. Denies CP. No dyspnea. Ambulating w/o difficulty.   Inpatient Medications    Scheduled Meds: . aspirin EC  81 mg Oral Daily  . insulin aspart  0-15 Units Subcutaneous TID WC  . insulin glargine  28 Units Subcutaneous QHS  . metoprolol succinate  75 mg Oral Daily  . pantoprazole  40 mg Oral Daily  . rOPINIRole  2 mg Oral QHS  . sodium chloride flush  3 mL Intravenous Q12H  . temazepam  15 mg Oral QHS  . ticagrelor  90 mg Oral BID   Continuous Infusions: . sodium chloride    . sodium chloride 1 mL/kg/hr (08/14/18 0411)   PRN Meds: sodium chloride, acetaminophen, morphine injection, nitroGLYCERIN, ondansetron (ZOFRAN) IV, oxyCODONE, sodium chloride flush   Vital Signs    Vitals:   08/13/18 2015 08/13/18 2100 08/14/18 0334 08/14/18 0357  BP: (!) 145/81  (!) 158/94   Pulse: 89 (!) 121 81   Resp: 19 (!) 22 13   Temp: 98.5 F (36.9 C)  98.5 F (36.9 C)   TempSrc: Oral  Oral   SpO2: 100% 98% 97%   Weight:   99.9 kg 99.9 kg  Height:        Intake/Output Summary (Last 24 hours) at 08/14/2018 [id number] Last data filed at 08/14/2018 0630 Gross per 24 hour  Intake 1709.3 ml  Output 1075 ml  Net 634.3 ml   Last 3 Weights 08/14/2018 08/14/2018 08/13/2018  Weight (lbs) 220 lb 3.8 oz 220 lb 3.8 oz 220 lb  Weight (kg) 99.9 kg 99.9 kg 99.791 kg      Telemetry    Sinus tach 106 - Personally Reviewed  ECG    Sinus arrthymia - Personally Reviewed  Physical Exam   GEN: WM in late 60s, moderately obese No acute distress.   Neck: No JVD Cardiac: RRR, no murmurs, rubs, or gallops.  Respiratory: Clear to auscultation bilaterally. GI: Soft, nontender, non-distended  MS: No edema; No deformity. Neuro:  Nonfocal  Psych: Normal affect   Labs    Chemistry Recent Labs  Lab 08/13/18 1331 08/14/18 0324  NA 136 136   K 3.7 3.0*  CL 95* 100  CO2 27 26  GLUCOSE 200* 95  BUN 18 14  CREATININE 1.00 0.82  CALCIUM 9.6 8.6*  PROT 7.9  --   ALBUMIN 4.6  --   AST 18  --   ALT 32  --   ALKPHOS 57  --   BILITOT 1.1  --   GFRNONAA >60 >60  GFRAA >60 >60  ANIONGAP 14 10     Hematology Recent Labs  Lab 08/13/18 1331 08/14/18 0324  WBC 11.3* 10.9*  RBC 6.04* 5.12  HGB 17.0 14.5  HCT 50.3 41.8  MCV 83.3 81.6  MCH 28.1 28.3  MCHC 33.8 34.7  RDW 14.1 13.8  PLT 330 263    Cardiac Enzymes Recent Labs  Lab 08/13/18 1501 08/13/18 2156 08/14/18 0324  TROPONINI 0.47* 1.36* 1.36*   No results for input(s): TROPIPOC in the last 168 hours.   BNPNo results for input(s): BNP, PROBNP in the last 168 hours.   DDimer No results for input(s): DDIMER in the last 168 hours.   Radiology    Ct Angio Abd/pel W And/or Wo Contrast  Result Date: 08/13/2018 CLINICAL DATA:  68 year old with severe  upper abdominal pain. Evaluate for mesenteric ischemia. EXAM: CT ANGIOGRAPHY ABDOMEN AND PELVIS WITH CONTRAST AND WITHOUT CONTRAST TECHNIQUE: Multidetector CT imaging of the abdomen and pelvis was performed using the standard protocol during bolus administration of intravenous contrast. Multiplanar reconstructed images and MIPs were obtained and reviewed to evaluate the vascular anatomy. CONTRAST:  OMNIPAQUE IOHEXOL 350 MG/ML SOLN COMPARISON:  CT of the abdomen pelvis 07/28/2018 12/31/2016 FINDINGS: VASCULAR Aorta: Mild atherosclerotic disease in the distal abdominal aorta without aneurysm or dissection. Proximal abdominal aorta is ectatic measuring 2.9 cm. Celiac: Patent without evidence of aneurysm, dissection, vasculitis or significant stenosis. Incidentally, there is a replaced left hepatic artery originating from the left gastric artery. SMA: Patent without evidence of aneurysm, dissection, vasculitis or significant stenosis. Renals: Both renal arteries are patent without evidence of aneurysm, dissection,  vasculitis, fibromuscular dysplasia or significant stenosis. Small accessory right renal artery. IMA: Patent without evidence of aneurysm, dissection, vasculitis or significant stenosis. Inflow: Mild atherosclerotic disease in the iliac arteries. The common, internal and external iliac arteries are patent bilaterally. Slightly prominent noncalcified plaque in the proximal right internal iliac artery. Proximal Outflow: Proximal femoral arteries are patent bilaterally. Veins: Portal venous system is patent. No gross abnormality to the iliac veins, IVC or renal veins. Hepatic veins are patent. Review of the MIP images confirms the above findings. NON-VASCULAR Lower chest: Focal density of the right middle lobe base on sequence 6 image 13 is unchanged since 2018. Stable small nodule in the left lower lobe on sequence 3, image 2018 is unchanged since 2018. No large pleural effusions. Few densities at the base of the lingula and the base of the left major fissure are similar to the exam in 2018. Hepatobiliary: Normal appearance of the liver, gallbladder and portal venous system. No biliary dilatation. Pancreas: Unremarkable. No pancreatic ductal dilatation or surrounding inflammatory changes. Spleen: Normal in size without focal abnormality. Adrenals/Urinary Tract: Normal adrenal glands. There are multiple small low-density structures in both kidneys that are suggestive for cysts. However, there is an exophytic dense structure along the anterior right kidney that measures 1.5 x 1.5 x 1.6 cm. This exophytic structure is concerning for a small enhancing lesion in the right kidney. In addition, there is a indeterminate lesion in the posterior left kidney measuring 1.5 cm on sequence 11, image 46. Normal appearance of the urinary bladder. No hydronephrosis. No evidence for kidney stones. Stomach/Bowel: Stomach is within normal limits. No evidence of bowel wall thickening, distention, or inflammatory changes. There appears to  be a small normal appearing appendix. Diverticulosis in the sigmoid colon without acute inflammation. Lymphatic: No lymph node enlargement in the abdomen or pelvis. Reproductive: Prostate is unremarkable. Other: No ascites. Negative for free air. Again noted is a left inguinal hernia containing fat. Musculoskeletal: Disc space loss with vacuum disc at L4-L5. IMPRESSION: VASCULAR 1. No acute vascular abnormality. 2. Mild atherosclerotic disease in the aorta but the visceral arteries are widely patent. NON-VASCULAR 1. No acute abnormality in the abdomen or pelvis. No evidence to suggest mesenteric ischemia. 2. **An incidental finding of potential clinical significance has been found. Indeterminate renal lesions are again noted. 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 Recommend further characterization with an MRI, pre and post contrast. ** 3. Left inguinal hernia. 4. Small nodular densities at the lung bases have not significantly changed since 2018 and likely benign. These results were called by telephone at the time  of interpretation on 08/13/2018 at 4:13 pm to Dr. Meridee Score , who verbally acknowledged these results. Electronically Signed   By: Richarda Overlie M.D.   On: 08/13/2018 16:18    Cardiac Studies   LHC 08/13/18 Procedures   CORONARY STENT INTERVENTION  LEFT HEART CATH AND CORONARY ANGIOGRAPHY  Conclusion   1.  Severe single-vessel coronary artery disease with acute total occlusion of the left circumflex, treated successfully with PCI using a 3.5 x 16 mm Synergy DES 2.  Nonobstructive LAD and RCA stenoses as outlined 3.  Mild segmental LV systolic dysfunction with LVEF estimated at 45 to 50% with normal LVEDP.  Recommendations: Dual antiplatelet therapy with aspirin and ticagrelor x12 months.  Aggressive post MI medical therapy.  2D echocardiogram tomorrow.  If patient is stable could be considered for  discharge home tomorrow after his echo is interpreted.     Patient Profile     68 year old male with history of CAD, dilated ascending aorta, T2DM, HTN and HLD who presented initially to [facility] with chest pain and mildly elevated troponin. Symptoms felt to represent non-STEMI and he was transferred urgently for cardiac catheterization in the context of ongoing ischemic symptoms.  Assessment & Plan    1. NSTEMI: max troponin 1.36. LHC yesterday showed severe single vessel CAD w/ acute total occlusion of the LCx, treated w/ PCI + DES. Also nonbstructive CAD in the LAD and RCA, to be treated medically. He is CP free. No dyspnea. ambulating w/o exertional symptoms. EF mildly reduced at 45-50%. Plan DAPT w/ ASA + Brilinta x 12 months, +  blocker. Currently not on statin due to h/o intolerance. Given he presented w/ a NSTEMI, would recommend statin retrial if patient willing. If not, he will need referral to lipid clinic for PCSK9 inhibitor therapy.   2. HTN:  Elevated this morning but several of his home antihypertensives were held on admit, including metoprolol, clonidine, Olmesartan-amlodipine. His metoprolol has been reordered for this morning. Will also resume other home meds on d/c.   3. HLD: statin intolerant. LDL 99 mg/dL. Goal is < 70 mg/dL. I've discussed statin retrial but pt reports he has done this and still unable to tolerate. Recommend referral to lipid clinic for PCSK9i.   4. T2DM: hold metformin 48 hr post cath. Continue Jardiance. Continue on ARB. Followed by PCP.   5. Systolic Dysfunction: EF mildly reduced at 45-50%. Volume appears stable. No dyspnea. Continue home  blocker and ARB on d/c.   6. Hypokalemia: K 3.0 today. Will give supplemental K prior to d/c.   Dispo: possible d/c home later today. Note, pt was seen once by Dr. Eden Emms in 2018 but lives in [place]. He would like to f/u there. Will transfer to Dr. Wyline Mood. F/u with him or APP in 2 weeks.     For questions  or updates, please contact [facility] Please consult [email or web address] for contact info under        Signed, Robbie Lis, PA-C  08/14/2018, 7:12 AM     I have seen, examined the patient, and reviewed the above assessment and plan.  Changes to above are made where necessary.  On exam, RRR.  Doing well today, without chest pain or SOB.  Resume home antihypertensive medicines.  Cardiac rehab is encouraged.  Would benefit from lipid clinic.  Started on brilinta. DC to home with close follow-up as above.   Co Sign: Hillis Range, MD 08/14/2018 8:12 AM

## 2018-08-14 NOTE — Progress Notes (Signed)
Patient ambulated 300 ft without assistance.

## 2018-08-14 NOTE — Progress Notes (Signed)
  Echocardiogram 2D Echocardiogram has been performed.  Russell Morrow 08/14/2018, 9:55 AM

## 2018-08-14 NOTE — Progress Notes (Signed)
Pt walked without problems this am. Feeling good, BP elevated but hasn't received meds. Ed completed with pt and wife with good reception. 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 Understands the importance of Brilinta. Will refer to [facility] CRPII. [id number] Ethelda Chick CES, ACSM 8:43 AM 08/14/2018

## 2018-08-14 NOTE — Discharge Summary (Signed)
Discharge Summary    Patient ID: Russell Morrow MRN: [id number]; DOB: 24-Sep-1950  Admit date: 08/13/2018 Discharge date: 08/14/2018  Primary Care Provider: Benita Stabile, MD  Primary Cardiologist: Dina Rich, MD  Primary Electrophysiologist:  None   Discharge Diagnoses    Principal Problem:   Hypertensive emergency Active Problems:   Abdominal pain, epigastric   Type 2 diabetes mellitus (HCC)   Elevated troponin   CAD (coronary artery disease)   NSTEMI (non-ST elevated myocardial infarction) (HCC)   Allergies Allergies  Allergen Reactions  . Statins     Muscle aches, liver pain    Diagnostic Studies/Procedures    LHC 08/13/18 Procedures   CORONARY STENT INTERVENTION  LEFT HEART CATH AND CORONARY ANGIOGRAPHY  Conclusion   1. Severe single-vessel coronary artery disease with acute total occlusion of the left circumflex, treated successfully with PCI using a 3.5 x 16 mm Synergy DES 2. Nonobstructive LAD and RCA stenoses as outlined 3. Mild segmental LV systolic dysfunction with LVEF estimated at 45 to 50% with normal LVEDP.  Recommendations: Dual antiplatelet therapy with aspirin and ticagrelor x12 months. Aggressive post MI medical therapy.    2D Echo 08/14/18 - performed prior to d/c. Result pending.     History of Present Illness       Russell Morrow is a 68 y.o. male with past medical history of CAD (mild nonobstructive CAD by cath in 2010 with NST in 11/2015 showing a fixed defect with no ischemia and Coronary CT showing nonobstructive CAD along LAD and LCx), HTN, Type 2 DM, and family history of CADwho initially presented to [facility] in [place] with chief complaint of chest pain.  He also had associated nausea and diaphoresis.  At [facility], his blood pressure was elevated at 189/103.  Patient was started on nitroglycerin drip and blood pressure and pain improved.  EKG showed no acute ST abnormalities however he ruled in for non-STEMI.   Troponin rose to 1.36.  He was started on IV heparin and transferred to [facility] for further management.   Hospital Course     Patient was in stable condition upon transfer to [facility].  He underwent cardiac catheterization, performed by Dr. Excell Seltzer, which showed severe single vessel CAD w/ acute total occlusion of the LCx, treated w/ PCI + DES. Also nonbstructive CAD in the LAD and RCA, to be treated medically.  LV gram showed mildly reduced left ventricular systolic function with EF of 45 to 50%.  He tolerated the procedure well and left the Cath Lab in stable condition.  Patient was placed on dual antiplatelet therapy with aspirin and Brilinta, with recommendations to stay on dual antiplatelet therapy for minimum of 12 months.  He was also placed on a beta-blocker.  His home ARB was resumed.  Patient was not placed on statin due to history of statin intolerance.  We discussed statin retrial however the patient was unwilling.  We will refer him to the lipid clinic for PCSK9 inhibitor therapy.  Patient's other antihypertensive medications were continued on discharge.  Patient denied any recurrent chest pain.  No dyspnea.  Vital signs remained stable.  Cath access site remained stable.  He ambulated with cardiac rehab without difficulty.  He was last seen and examined by Dr. Johney Frame who felt that he was stable for discharge home.  Patient was seen previously by Dr. Eden Emms in 2018 however he lives in [place] and prefers to be followed there.  We will arrange  post hospital follow-up with Dr. Wyline Mood, who initially saw him, or APP in 1 to 2 weeks.  Consultants: none    Discharge Vitals Blood pressure (!) 154/90, pulse 96, temperature 98 F (36.7 C), temperature source Oral, resp. rate 18, height  (1.854 m), weight 99.9 kg, SpO2 98 %.  Filed Weights   08/13/18 1240 08/14/18 0334 08/14/18 0357  Weight: 99.8 kg 99.9 kg 99.9 kg    Labs & Radiologic Studies    CBC Recent  Labs    08/13/18 1331 08/14/18 0324  WBC 11.3* 10.9*  HGB 17.0 14.5  HCT 50.3 41.8  MCV 83.3 81.6  PLT 330 263   Basic Metabolic Panel Recent Labs    16/10/96 1331 08/13/18 1501 08/14/18 0324  NA 136  --  136  K 3.7  --  3.0*  CL 95*  --  100  CO2 27  --  26  GLUCOSE 200*  --  95  BUN 18  --  14  CREATININE 1.00  --  0.82  CALCIUM 9.6  --  8.6*  MG  --  1.9  --    Liver Function Tests Recent Labs    08/13/18 1331  AST 18  ALT 32  ALKPHOS 57  BILITOT 1.1  PROT 7.9  ALBUMIN 4.6   Recent Labs    08/13/18 1331  LIPASE 29   Cardiac Enzymes Recent Labs    08/13/18 1501 08/13/18 2156 08/14/18 0324  TROPONINI 0.47* 1.36* 1.36*   BNP Invalid input(s): POCBNP D-Dimer No results for input(s): DDIMER in the last 72 hours. Hemoglobin A1C No results for input(s): HGBA1C in the last 72 hours. Fasting Lipid Panel Recent Labs    08/14/18 0324  CHOL 143  HDL 26*  LDLCALC 99  TRIG 91  CHOLHDL 5.5   Thyroid Function Tests No results for input(s): TSH, T4TOTAL, T3FREE, THYROIDAB in the last 72 hours.  Invalid input(s): FREET3 _____________  Ct Abdomen Pelvis W Contrast  Result Date: 07/28/2018 CLINICAL DATA:  Upper abdominal pain with nausea and vomiting EXAM: CT ABDOMEN AND PELVIS WITH CONTRAST TECHNIQUE: Multidetector CT imaging of the abdomen and pelvis was performed using the standard protocol following bolus administration of intravenous contrast. CONTRAST:  OMNIPAQUE IOHEXOL 300 MG/ML  SOLN COMPARISON:  CT 02/16/2017 FINDINGS: Lower chest: Lung bases demonstrate subsegmental atelectasis at the right base. No pleural effusion. The heart size is within normal limits. Hepatobiliary: Small stone in the gallbladder. No focal hepatic abnormality or biliary dilatation Pancreas: Unremarkable. No pancreatic ductal dilatation or surrounding inflammatory changes. Spleen: Normal in size without focal abnormality. Adrenals/Urinary Tract: Adrenal glands are normal.  No hydronephrosis. Subcentimeter hypodensities within the kidneys, too small to further characterize. 17 mm intermediate density lesion mid right kidney. 12 mm intermediate density medial midpole left kidney. 14 mm intermediate density midpole posterior left kidney. Bladder is normal. Stomach/Bowel: Stomach is within normal limits. Appendix appears normal. No evidence of bowel wall thickening, distention, or inflammatory changes. Sigmoid colon diverticular disease without acute inflammatory process. Vascular/Lymphatic: Mild aortic atherosclerosis. No aneurysm. No significantly enlarged lymph nodes. Reproductive: Prostate is unremarkable. Other: Fat within the left greater than right inguinal canal. No free air or free fluid Musculoskeletal: No acute or suspicious abnormality. Degenerative change most prominent at L4-L5. IMPRESSION: 1. No CT evidence for acute intra-abdominal or pelvic abnormality. 2. Indeterminate bilateral small renal lesions. Suggest nonemergent MRI for further evaluation. 3. Small stone in the gallbladder 4. Sigmoid colon diverticular disease without acute inflammatory change  Electronically Signed   By: Jasmine Pang M.D.   On: 07/28/2018 19:31   Ct Angio Abd/pel W And/or Wo Contrast  Result Date: 08/13/2018 CLINICAL DATA:  68 year old with severe upper abdominal pain. Evaluate for mesenteric ischemia. EXAM: CT ANGIOGRAPHY ABDOMEN AND PELVIS WITH CONTRAST AND WITHOUT CONTRAST TECHNIQUE: Multidetector CT imaging of the abdomen and pelvis was performed using the standard protocol during bolus administration of intravenous contrast. Multiplanar reconstructed images and MIPs were obtained and reviewed to evaluate the vascular anatomy. CONTRAST:  OMNIPAQUE IOHEXOL 350 MG/ML SOLN COMPARISON:  CT of the abdomen pelvis 07/28/2018 12/31/2016 FINDINGS: VASCULAR Aorta: Mild atherosclerotic disease in the distal abdominal aorta without aneurysm or dissection. Proximal abdominal aorta is ectatic  measuring 2.9 cm. Celiac: Patent without evidence of aneurysm, dissection, vasculitis or significant stenosis. Incidentally, there is a replaced left hepatic artery originating from the left gastric artery. SMA: Patent without evidence of aneurysm, dissection, vasculitis or significant stenosis. Renals: Both renal arteries are patent without evidence of aneurysm, dissection, vasculitis, fibromuscular dysplasia or significant stenosis. Small accessory right renal artery. IMA: Patent without evidence of aneurysm, dissection, vasculitis or significant stenosis. Inflow: Mild atherosclerotic disease in the iliac arteries. The common, internal and external iliac arteries are patent bilaterally. Slightly prominent noncalcified plaque in the proximal right internal iliac artery. Proximal Outflow: Proximal femoral arteries are patent bilaterally. Veins: Portal venous system is patent. No gross abnormality to the iliac veins, IVC or renal veins. Hepatic veins are patent. Review of the MIP images confirms the above findings. NON-VASCULAR Lower chest: Focal density of the right middle lobe base on sequence 6 image 13 is unchanged since 2018. Stable small nodule in the left lower lobe on sequence 3, image 2018 is unchanged since 2018. No large pleural effusions. Few densities at the base of the lingula and the base of the left major fissure are similar to the exam in 2018. Hepatobiliary: Normal appearance of the liver, gallbladder and portal venous system. No biliary dilatation. Pancreas: Unremarkable. No pancreatic ductal dilatation or surrounding inflammatory changes. Spleen: Normal in size without focal abnormality. Adrenals/Urinary Tract: Normal adrenal glands. There are multiple small low-density structures in both kidneys that are suggestive for cysts. However, there is an exophytic dense structure along the anterior right kidney that measures 1.5 x 1.5 x 1.6 cm. This exophytic structure is concerning for a small enhancing  lesion in the right kidney. In addition, there is a indeterminate lesion in the posterior left kidney measuring 1.5 cm on sequence 11, image 46. Normal appearance of the urinary bladder. No hydronephrosis. No evidence for kidney stones. Stomach/Bowel: Stomach is within normal limits. No evidence of bowel wall thickening, distention, or inflammatory changes. There appears to be a small normal appearing appendix. Diverticulosis in the sigmoid colon without acute inflammation. Lymphatic: No lymph node enlargement in the abdomen or pelvis. Reproductive: Prostate is unremarkable. Other: No ascites. Negative for free air. Again noted is a left inguinal hernia containing fat. Musculoskeletal: Disc space loss with vacuum disc at L4-L5. IMPRESSION: VASCULAR 1. No acute vascular abnormality. 2. Mild atherosclerotic disease in the aorta but the visceral arteries are widely patent. NON-VASCULAR 1. No acute abnormality in the abdomen or pelvis. No evidence to suggest mesenteric ischemia. 2. **An incidental finding of potential clinical significance has been found. Indeterminate renal lesions are again noted. 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 Recommend further characterization with an MRI, pre and  post contrast. ** 3. Left inguinal hernia. 4. Small nodular densities at the lung bases have not significantly changed since 2018 and likely benign. These results were called by telephone at the time of interpretation on 08/13/2018 at 4:13 pm to Dr. Meridee Score , who verbally acknowledged these results. Electronically Signed   By: Richarda Overlie M.D.   On: 08/13/2018 16:18   Disposition   Pt is being discharged home today in good condition.  Follow-up Plans & Appointments    Follow-up Information    Branch, Dorothe Pea, MD Follow up.   Specialty:  Cardiology Why:  our office will call you with a hospital follow-up visit in 1-2 weeks   Contact information: [address] [place] [address] [phone]          Discharge Instructions    Amb Referral to Cardiac Rehabilitation   Complete by:  As directed    Diagnosis:   Coronary Stents NSTEMI PTCA        Discharge Medications   Allergies as of 08/14/2018      Reactions   Statins    Muscle aches, liver pain      Medication List    STOP taking these medications   acetaminophen 325 MG tablet Commonly known as:  TYLENOL     TAKE these medications   aspirin 81 MG EC tablet Take 1 tablet (81 mg total) by mouth daily. Start taking on:  August 15, 2018   cloNIDine 0.2 MG tablet Commonly known as:  CATAPRES Take 0.2 mg by mouth 2 (two) times daily.   diphenhydrAMINE 25 mg capsule Commonly known as:  BENADRYL Take 25 mg by mouth at bedtime.   Esomeprazole Magnesium 20 MG Tbec Take 20 mg by mouth 2 (two) times daily before a meal.   insulin glargine 100 UNIT/ML injection Commonly known as:  LANTUS Inject 28 Units into the skin at bedtime.   Jardiance 10 MG Tabs tablet Generic drug:  empagliflozin Take 10 mg by mouth daily.   metFORMIN 1000 MG tablet Commonly known as:  GLUCOPHAGE Take 1 tablet (1,000 mg total) by mouth 2 (two) times daily with a meal. Start taking on:  August 16, 2018 What changed:  These instructions start on August 16, 2018. If you are unsure what to do until then, ask your doctor or other care provider.   metoprolol succinate 25 MG 24 hr tablet Commonly known as:  TOPROL-XL Take 3 tablets (75 mg total) by mouth daily.   nitroGLYCERIN 0.4 MG SL tablet Commonly known as:  NITROSTAT Place 1 tablet (0.4 mg total) under the tongue every 5 (five) minutes x 3 doses as needed for chest pain.   Olmesartan-amLODIPine-HCTZ 40-5-25 MG Tabs Take 1 tablet by mouth daily.   ondansetron 4 MG tablet Commonly known as:  ZOFRAN TAKE 1 TABLET BY MOUTH EVERY 6 HOURS AS NEEDED NAUSEA   pantoprazole 40 MG tablet Commonly known as:   PROTONIX Take 40 mg by mouth daily.   potassium chloride SA 20 MEQ tablet Commonly known as:  K-DUR,KLOR-CON Take 2 tablets (40 mEq total) by mouth daily. What changed:  how much to take   rOPINIRole 1 MG tablet Commonly known as:  REQUIP Take 2 mg by mouth at bedtime.   temazepam 15 MG capsule Commonly known as:  RESTORIL Take 15 mg by mouth at bedtime.   ticagrelor 90 MG Tabs tablet Commonly known as:  BRILINTA Take 1 tablet (90 mg total) by mouth 2 (two) times  daily.   ticagrelor 90 MG Tabs tablet Commonly known as:  BRILINTA Take 1 tablet (90 mg total) by mouth 2 (two) times daily.   VICTOZA [place] Inject 1.8 mLs into the skin at bedtime.        Acute coronary syndrome (MI, NSTEMI, STEMI, etc) this admission?: Yes.     AHA/ACC Clinical Performance & Quality Measures: 1. Aspirin prescribed? - Yes 2. ADP Receptor Inhibitor (Plavix/Clopidogrel, Brilinta/Ticagrelor or Effient/Prasugrel) prescribed (includes medically managed patients)? - Yes 3. Beta Blocker prescribed? - Yes 4. High Intensity Statin (Lipitor 40-80mg  or Crestor 20-40mg ) prescribed? - No - statin intolerance 5. EF assessed during THIS hospitalization? - Yes 6. For EF <40%, was ACEI/ARB prescribed? - Yes 7. For EF <40%, Aldosterone Antagonist (Spironolactone or Eplerenone) prescribed? - Not Applicable (EF >/= 40%) 8. Cardiac Rehab Phase II ordered (Included Medically managed Patients)? - Yes     Outstanding Labs/Studies   F/u on Echo result at hospital f/u visit. Was done prior to d/c.   Duration of Discharge Encounter   Greater than 30 minutes including physician time.  Signed, Robbie Lis, PA-C 08/14/2018, 9:28 AM

## 2018-08-16 ENCOUNTER — Encounter (HOSPITAL_COMMUNITY): Payer: Self-pay | Admitting: Cardiovascular Disease

## 2018-08-17 ENCOUNTER — Telehealth: Payer: Self-pay | Admitting: Cardiology

## 2018-08-17 ENCOUNTER — Other Ambulatory Visit (HOSPITAL_COMMUNITY): Payer: Self-pay | Admitting: Internal Medicine

## 2018-08-17 ENCOUNTER — Ambulatory Visit: Payer: Medicare Other | Admitting: Student

## 2018-08-17 ENCOUNTER — Encounter: Payer: Self-pay | Admitting: Student

## 2018-08-17 VITALS — BP 132/68 | HR 99 | Ht 74.0 in | Wt 221.0 lb

## 2018-08-17 DIAGNOSIS — I10 Essential (primary) hypertension: Secondary | ICD-10-CM

## 2018-08-17 DIAGNOSIS — I214 Non-ST elevation (NSTEMI) myocardial infarction: Secondary | ICD-10-CM | POA: Diagnosis not present

## 2018-08-17 DIAGNOSIS — I2510 Atherosclerotic heart disease of native coronary artery without angina pectoris: Secondary | ICD-10-CM

## 2018-08-17 DIAGNOSIS — I471 Supraventricular tachycardia: Secondary | ICD-10-CM

## 2018-08-17 DIAGNOSIS — E785 Hyperlipidemia, unspecified: Secondary | ICD-10-CM

## 2018-08-17 DIAGNOSIS — R9389 Abnormal findings on diagnostic imaging of other specified body structures: Secondary | ICD-10-CM

## 2018-08-17 DIAGNOSIS — E1169 Type 2 diabetes mellitus with other specified complication: Secondary | ICD-10-CM | POA: Diagnosis not present

## 2018-08-17 DIAGNOSIS — R1010 Upper abdominal pain, unspecified: Secondary | ICD-10-CM | POA: Diagnosis not present

## 2018-08-17 DIAGNOSIS — N289 Disorder of kidney and ureter, unspecified: Secondary | ICD-10-CM

## 2018-08-17 DIAGNOSIS — I255 Ischemic cardiomyopathy: Secondary | ICD-10-CM | POA: Diagnosis not present

## 2018-08-17 DIAGNOSIS — K219 Gastro-esophageal reflux disease without esophagitis: Secondary | ICD-10-CM | POA: Diagnosis not present

## 2018-08-17 DIAGNOSIS — I110 Hypertensive heart disease with heart failure: Secondary | ICD-10-CM | POA: Diagnosis not present

## 2018-08-17 MED ORDER — ROSUVASTATIN CALCIUM 5 MG PO TABS: 5.0000 mg | ORAL_TABLET | ORAL | 11 refills | Status: DC

## 2018-08-17 NOTE — Telephone Encounter (Signed)
Carrie in radiology scheduling is needing to know if Russell Morrow stent he just had is MRI safe and how long does he need to wait till he can have an MRI done? Dr. Margo Aye has ordered this test  Please call [place] @ [phone]

## 2018-08-17 NOTE — Progress Notes (Signed)
Cardiology Office Note    Date:  08/17/2018   ID:  Russell Morrow, DOB 1950/07/31, MRN [id number]  PCP:  Benita Stabile, MD  Cardiologist: Dina Rich, MD    Chief Complaint  Patient presents with  . Hospitalization Follow-up    s/p NSTEMI    History of Present Illness:    Russell Morrow is a 68 y.o. male with past medical history of CAD (mild nonobstructive CAD by cath in 2010 with NST in 11/2015 showing a fixed defect with no ischemia and Coronary CT showing nonobstructive CAD along LAD and LCx), HTN, Type 2 DM, and family history of CADwho presents to the office today for hospital follow-up.   Patient was initially admitted to [facility] in 07/2018 for evaluation of abdominal pain, nausea, and vomiting. He was found to have cholelithiasis with no acute findings. Cardiology was consulted during admission due to frequent ectopy and he did have an episode of wide-complex tachycardia lasting for approximately 1 minute which was thought to be most consistent with SVT with aberrancy as opposed to VT. Echocardiogram showed a preserved EF and Toprol-XL was titrated to 75 mg daily.  Presented back to [facility] ED on 08/13/2018 for evaluation of sternal discomfort which felt different from his previous symptoms. He also reported associated dyspnea and nausea. Troponin was found to be elevated to 0.47 (peaked at 1.36) and given his persistent symptoms despite the use of Morphine and nitroglycerin drip, he was transferred to [facility] for a cardiac catheterization. This showed severe single-vessel coronary artery disease with acute total occlusion of the left circumflex, treated successfully with PCI using a 3.5 x 16 mm Synergy DES. Was noted to have nonobstructive LAD and RCA stenoses and LVEF was estimated at 45 to 50% with normal LVEDP. Echo the following day showed an EF of 45-50% with inferior and lateral wall hypokinesis. The following morning, he denied any recurrent chest pain or dyspnea.  Ambulated over 300 ft without recurrent anginal symptoms. He was started on ASA and Brilinta along with being continued on PTA Clonidine, Olmesartan-HCTZ-Amlodipine, and Toprol-XL. He was statin intolerant and referral to the Lipid Clinic for initiation of PSCK-9 inhibitor therapy was recommended. Of note, he did have a CT Abdomen during admission which showed indeterminate renal lesions measuring up to 1.6 cm with MRI recommended for further evaluation to exclude renal cell carcinoma.   In talking with the patient today, he reports doing well since his recent hospitalization. He denies any recurrent episodes of chest pain over the past few days. No recent dyspnea on exertion. His palpitations did improve with titration of Toprol-XL last month. Denies any recent orthopnea, PND, or lower extremity edema.  Reports good compliance with ASA and Brilinta, denies missing any recent doses. He is concerned about the cost of Brilinta as several of his namebrand diabetic medications are expensive.   Past Medical History:  Diagnosis Date  . CAD (coronary artery disease)    a. mild nonobstructive CAD by cath in 2010 b. NST in 11/2015 showing a fixed defect with no ischemia and Coronary CT showing nonobstructive CAD along LAD and LCx c. 08/2018: s/p NSTEMI with cath showing acute total occlusion of LCx (treated with DES) and nonobstructive CAD along RCA and LAD.   . Diabetes mellitus without complication (HCC)   . Diverticulitis   . Hypertension     Past Surgical History:  Procedure Laterality Date  . CERVICAL FUSION    . CORONARY STENT INTERVENTION N/A  08/13/2018   Procedure: CORONARY STENT INTERVENTION;  Surgeon: Russell Bollman, MD;  Location: [facility] INVASIVE CV LAB;  Service: Cardiovascular;  Laterality: N/A;  . LEFT HEART CATH AND CORONARY ANGIOGRAPHY N/A 08/13/2018   Procedure: LEFT HEART CATH AND CORONARY ANGIOGRAPHY;  Surgeon: Russell Bollman, MD;  Location: [facility] INVASIVE CV LAB;  Service: Cardiovascular;   Laterality: N/A;  . nasal septum repair      Current Medications: Outpatient Medications Prior to Visit  Medication Sig Dispense Refill  . esomeprazole (NEXIUM) 20 MG capsule Take 20 mg by mouth 2 (two) times daily before a meal.    . potassium chloride SA (K-DUR,KLOR-CON) 20 MEQ tablet Take 20 mEq by mouth daily.    Marland Kitchen aspirin EC 81 MG EC tablet Take 1 tablet (81 mg total) by mouth daily.    . diphenhydrAMINE (BENADRYL) 25 mg capsule Take 25 mg by mouth at bedtime.    . empagliflozin (JARDIANCE) 10 MG TABS tablet Take 10 mg by mouth daily.    . Esomeprazole Magnesium 20 MG TBEC Take 20 mg by mouth 2 (two) times daily before a meal.     . insulin glargine (LANTUS) 100 UNIT/ML injection Inject 28 Units into the skin at bedtime.     . metFORMIN (GLUCOPHAGE) 1000 MG tablet Take 1 tablet (1,000 mg total) by mouth 2 (two) times daily with a meal.    . metoprolol succinate (TOPROL-XL) 25 MG 24 hr tablet Take 3 tablets (75 mg total) by mouth daily. 90 tablet 5  . nitroGLYCERIN (NITROSTAT) 0.4 MG SL tablet Place 1 tablet (0.4 mg total) under the tongue every 5 (five) minutes x 3 doses as needed for chest pain. 25 tablet 2  . Olmesartan-amLODIPine-HCTZ 40-5-25 MG TABS Take 1 tablet by mouth daily.    . ondansetron (ZOFRAN) 4 MG tablet TAKE 1 TABLET BY MOUTH EVERY 6 HOURS AS NEEDED NAUSEA    . rOPINIRole (REQUIP) 1 MG tablet Take 2 mg by mouth at bedtime.    . temazepam (RESTORIL) 15 MG capsule Take 15 mg by mouth at bedtime.    . ticagrelor (BRILINTA) 90 MG TABS tablet Take 1 tablet (90 mg total) by mouth 2 (two) times daily. 180 tablet 3  . cloNIDine (CATAPRES) 0.2 MG tablet Take 0.2 mg by mouth 2 (two) times daily.    . Liraglutide (VICTOZA [place]) Inject 1.8 mLs into the skin at bedtime.     . pantoprazole (PROTONIX) 40 MG tablet Take 40 mg by mouth daily.    . potassium chloride SA (K-DUR,KLOR-CON) 20 MEQ tablet Take 2 tablets (40 mEq total) by mouth daily. (Patient taking differently: Take 20 mEq by  mouth daily. ) 5 tablet 0  . ticagrelor (BRILINTA) 90 MG TABS tablet Take 1 tablet (90 mg total) by mouth 2 (two) times daily. 60 tablet 0   No facility-administered medications prior to visit.      Allergies:   Statins and Zetia [ezetimibe]   Social History   Socioeconomic History  . Marital status: Married    Spouse name: Not on file  . Number of children: Not on file  . Years of education: Not on file  . Highest education level: Not on file  Occupational History  . Not on file  Social Needs  . Financial resource strain: Not on file  . Food insecurity:    Worry: Not on file    Inability: Not on file  . Transportation needs:    Medical: Not on file  Non-medical: Not on file  Tobacco Use  . Smoking status: Never Smoker  . Smokeless tobacco: Never Used  Substance and Sexual Activity  . Alcohol use: Not Currently    Alcohol/week: 14.0 standard drinks    Types: 14 Glasses of wine per week    Comment: wine usually daily  . Drug use: Yes    Types: Marijuana  . Sexual activity: Not on file  Lifestyle  . Physical activity:    Days per week: Not on file    Minutes per session: Not on file  . Stress: Not on file  Relationships  . Social connections:    Talks on phone: Not on file    Gets together: Not on file    Attends religious service: Not on file    Active member of club or organization: Not on file    Attends meetings of clubs or organizations: Not on file    Relationship status: Not on file  Other Topics Concern  . Not on file  Social History Narrative  . Not on file     Family History:  The patient's family history includes Cancer in his mother; Diabetes in his brother and father; Heart disease in his brother and father; Hypertension in his brother, father, and mother.   Review of Systems:   Please see the history of present illness.     General:  No chills, fever, night sweats or weight changes.  Cardiovascular:  No dyspnea on exertion, edema, orthopnea,  palpitations, paroxysmal nocturnal dyspnea. Positive for chest pain (now resolved).  Dermatological: No rash, lesions/masses Respiratory: No cough, dyspnea Urologic: No hematuria, dysuria Abdominal:   No nausea, vomiting, diarrhea, bright red blood per rectum, melena, or hematemesis Neurologic:  No visual changes, wkns, changes in mental status. All other systems reviewed and are otherwise negative except as noted above.   Physical Exam:    VS:  BP 132/68   Pulse 99   Ht 6\' 2"  (1.88 m)   Wt 221 lb (100.2 kg)   SpO2 98%   BMI 28.37 kg/m    General: Well developed, well nourished Caucasian male appearing in no acute distress. Head: Normocephalic, atraumatic, sclera non-icteric, no xanthomas, nares are without discharge.  Neck: No carotid bruits. JVD not elevated.  Lungs: Respirations regular and unlabored, without wheezes or rales.  Heart: Regular rate and rhythm. No S3 or S4.  No murmur, no rubs, or gallops appreciated. Abdomen: Soft, non-tender, non-distended with normoactive bowel sounds. No hepatomegaly. No rebound/guarding. No obvious abdominal masses. Msk:  Strength and tone appear normal for age. No joint deformities or effusions. Extremities: No clubbing or cyanosis. No edema.  Distal pedal pulses are 2+ bilaterally. Radial site stable with mild ecchymosis. No evidence of a hematoma.  Neuro: Alert and oriented X 3. Moves all extremities spontaneously. No focal deficits noted. Psych:  Responds to questions appropriately with a normal affect. Skin: No rashes or lesions noted  Wt Readings from Last 3 Encounters:  08/17/18 221 lb (100.2 kg)  08/14/18 220 lb 3.8 oz (99.9 kg)  07/30/18 218 lb 4.1 oz (99 kg)     Studies/Labs Reviewed:   EKG:  EKG is not ordered today.   Recent Labs: 07/29/2018: TSH 0.589 08/13/2018: ALT 32; Magnesium 1.9 08/14/2018: BUN 14; Creatinine, Ser 0.82; Hemoglobin 14.5; Platelets 263; Potassium 3.0; Sodium 136   Lipid Panel    Component Value  Date/Time   CHOL 143 08/14/2018 0324   TRIG 91 08/14/2018 0324   HDL 26 (  L) 08/14/2018 0324   CHOLHDL 5.5 08/14/2018 0324   VLDL 18 08/14/2018 0324   LDLCALC 99 08/14/2018 0324    Additional studies/ records that were reviewed today include:   Echocardiogram: 07/29/2018 IMPRESSIONS  1. The left ventricle has normal systolic function with an ejection fraction of 60-65%. The cavity size was normal. There is moderately increased left ventricular wall thickness. Left ventricular diastolic Doppler parameters are consistent with impaired  relaxation.  2. The right ventricle has normal systolic function. The cavity was normal. There is no increase in right ventricular wall thickness.  3. The mitral valve is normal in structure. No evidence of mitral valve stenosis.  4. The tricuspid valve is normal in structure.  5. The aortic valve has an indeterminant number of cusps Mild thickening of the aortic valve no stenosis of the aortic valve.  6. The aortic root is normal in size and structure.  7. There is mild dilatation of the ascending aorta measuring 38 mm.  8. The inferior vena cava was dilated in size with >50% respiratory variability.  Cardiac Catheterization: 08/13/2018 1.  Severe single-vessel coronary artery disease with acute total occlusion of the left circumflex, treated successfully with PCI using a 3.5 x 16 mm Synergy DES 2.  Nonobstructive LAD and RCA stenoses as outlined 3.  Mild segmental LV systolic dysfunction with LVEF estimated at 45 to 50% with normal LVEDP.  Recommendations: Dual antiplatelet therapy with aspirin and ticagrelor x12 months.  Aggressive post MI medical therapy.  2D echocardiogram tomorrow.  If patient is stable could be considered for discharge home tomorrow after his echo is interpreted.  Limited Echo: 08/14/2018 IMPRESSIONS   1. Moderate hypokinesis of the left ventricular inferior wall and lateral wall.  2. The left ventricle has mildly reduced  systolic function, with an ejection fraction of 45-50%.  SUMMARY Limited echo for LVEF. There is inferior and lateral wall hypokinesis. LVEF 45-50%.  FINDINGS  Left Ventricle: The left ventricle has mildly reduced systolic function, with an ejection fraction of 45-50%. Moderate hypokinesis of the left ventricular inferior wall and lateral wall.  Assessment:    1. Non-ST elevation myocardial infarction (NSTEMI), subsequent episode of care (HCC)   2. Coronary artery disease involving native coronary artery of native heart without angina pectoris   3. Ischemic cardiomyopathy   4. Essential hypertension   5. Hyperlipidemia LDL goal <70   6. Atrial tachycardia, paroxysmal (HCC)   7. Abnormal CT scan      Plan:   In order of problems listed above:  1. Subsequent Episode of Care for NSTEMI/ CAD - the patient had mild nonobstructive CAD by cath in 2010 and was recently admitted for an NSTEMI (troponin peaked at 1.36) and catheterization showed severe single-vessel coronary artery disease with an acute total occlusion of the left circumflex, treated successfully with PCI using a 3.5 x 16 mm Synergy DES. Did have residual nonobstructive disease as outlined above.  - he denies any recurrent chest pain or dyspnea on exertion. Still having intermittent episodes of abdominal discomfort but not as severe as previously. No recurrent nausea.  - continue DAPT with ASA and Brilinta. If cost becomes an issue, may need to switch to Plavix. He was provided a 30-day card at discharge and samples were provided today. Continue BB therapy. Will plan to try low dose statin as outlined below. Refer to cardiac rehab here at [facility] (he is a retired Pharmacist, hospital and already started walking around his neighborhood for exercise).  2. Ischemic Cardiomyopathy - Echo during admission showed EF was mildly reduced at 45-50% with inferior and lateral wall hypokinesis. He remains on Toprol-XL and ARB. No evidence  of volume overload by examination.  - would anticipate a repeat echocardiogram in 3 months for reassessment of EF.   3. HTN - BP is well-controlled at 132/68 during today's visit.  - continue Toprol-XL  daily and Olmesartan-Amlodpine-HCTZ 40-5-25mg  daily. Confirmed with the patient he is no longer taking Clonidine and removed from his medication list.   4. HLD - FLP during recent admission showed total cholesterol 143, triglycerides 91, HDL 26, and LDL 99.  He has been intolerant to multiple statins in the past including Atorvastatin, Crestor, Pravastatin, Simvastatin, and Zetia. He never tried taking Crestor on a weekly basis or every other day, therefore will try Crestor  once weekly and titrate to every other day if able. If unable to tolerate or he does not achieve goal LDL, would refer to the Lipid Clinic for consideration of PCSK-9 Inhibitor.   5. SVT - reports his episodes of palpitations have decreased since titration of BB therapy.  - continue Toprol-XL  daily.   6. Abnormal Abdominal CT - recent CT Abdomen showed indeterminate renal lesions measuring up to 1.6 cm with MRI recommended for further evaluation to exclude renal cell carcinoma. Has been ordered by his PCP. No indications from a cardiac perspective as to why he could not proceed with planned MRI as there is no time frame for which he needs to wait following recent stent placement (confirmed with DOD - Dr. Purvis Sheffield as well) .    Medication Adjustments/Labs and Tests Ordered: Current medicines are reviewed at length with the patient today.  Concerns regarding medicines are outlined above.  Medication changes, Labs and Tests ordered today are listed in the Patient Instructions below. Patient Instructions  Medication Instructions:  Your physician has recommended you make the following change in your medication:  Start Crestor 5 mg once weekly   If you need a refill on your cardiac medications before your next  appointment, please call your pharmacy.   Lab work: NONE  If you have labs (blood work) drawn today and your tests are completely normal, you will receive your results only by: Marland Kitchen MyChart Message (if you have MyChart) OR . A paper copy in the mail If you have any lab test that is abnormal or we need to change your treatment, we will call you to review the results.  Testing/Procedures: NONE   Follow-Up: At [facility], you and your health needs are our priority.  As part of our continuing mission to provide you with exceptional heart care, we have created designated Provider Care Teams.  These Care Teams include your primary Cardiologist (physician) and Advanced Practice Providers (APPs -  Physician Assistants and Nurse Practitioners) who all work together to provide you with the care you need, when you need it. You will need a follow up appointment in 2 months.  Please call our office 2 months in advance to schedule this appointment.  You may see Dina Rich, MD or one of the following Advanced Practice Providers on your designated Care Team:   Randall An, PA-C [facility]) . Jacolyn Reedy, PA-C [facility] Office)  Any Other Special Instructions Will Be Listed Below (If Applicable). Thank you for choosing [facility]!        Signed, Ellsworth Lennox, PA-C  08/17/2018 8:30 PM    [facility] [address] [place], [place] [address] Phone: [phone] Fax: [phone]

## 2018-08-17 NOTE — Patient Instructions (Signed)
Medication Instructions:  Your physician has recommended you make the following change in your medication:  Start Crestor 5 mg once weekly   If you need a refill on your cardiac medications before your next appointment, please call your pharmacy.   Lab work: NONE  If you have labs (blood work) drawn today and your tests are completely normal, you will receive your results only by: Marland Kitchen MyChart Message (if you have MyChart) OR . A paper copy in the mail If you have any lab test that is abnormal or we need to change your treatment, we will call you to review the results.  Testing/Procedures: NONE   Follow-Up: At [facility], you and your health needs are our priority.  As part of our continuing mission to provide you with exceptional heart care, we have created designated Provider Care Teams.  These Care Teams include your primary Cardiologist (physician) and Advanced Practice Providers (APPs -  Physician Assistants and Nurse Practitioners) who all work together to provide you with the care you need, when you need it. You will need a follow up appointment in 2 months.  Please call our office 2 months in advance to schedule this appointment.  You may see Dina Rich, MD or one of the following Advanced Practice Providers on your designated Care Team:   Randall An, PA-C [facility]) . Jacolyn Reedy, PA-C [facility] Office)  Any Other Special Instructions Will Be Listed Below (If Applicable). Thank you for choosing [facility]!

## 2018-08-18 NOTE — Telephone Encounter (Signed)
Here is the conditional parameters, please fax to pcp. They can send to radiology and see if meets the criteria for the ordered MRI.  Dina Rich MD   Synergy drug eluting stent    Non-clinical testing has demonstrated that the SYNERGY Stent is MR Conditional for single and overlapped conditions up to 75 mm. A patient with this device can be safely scanned in a Magnetic Resonance system meeting the following conditions: Static magnetic field of 3.0 and 1.5 Tesla only  Maximum spatial gradient magnetic field of 2300 gauss/cm (23 T/m)  Maximum Magnetic Resonance system reported, whole body averaged specific absorption rate (SAR) of ?2 W/kg (Normal Operating Mode) Under the scan conditions defined above, the SYNERGY Stent is expected to produce a maximum temperature rise of 3.1oC after 15 minutes of continuous scanning. MR Image quality may be compromised if the area of interest is within the lumen or relatively near the stent. Therefore, it may be necessary to optimize MR imaging parameters for the presence of the stent. The image artifact extends approximately 1 cm from the stent when scanned in non-clinical MR testing specified in ASTM F2119-07. The artifact does obscure the device lumen. Image artifact was minimized using the spin echo sequence versus gradient echo.

## 2018-08-24 ENCOUNTER — Ambulatory Visit (HOSPITAL_COMMUNITY): Payer: Medicare Other

## 2018-08-24 ENCOUNTER — Encounter (HOSPITAL_COMMUNITY): Payer: Self-pay

## 2018-09-09 DIAGNOSIS — K219 Gastro-esophageal reflux disease without esophagitis: Secondary | ICD-10-CM | POA: Diagnosis not present

## 2018-09-09 DIAGNOSIS — I110 Hypertensive heart disease with heart failure: Secondary | ICD-10-CM | POA: Diagnosis not present

## 2018-09-09 DIAGNOSIS — E1169 Type 2 diabetes mellitus with other specified complication: Secondary | ICD-10-CM | POA: Diagnosis not present

## 2018-09-09 DIAGNOSIS — I214 Non-ST elevation (NSTEMI) myocardial infarction: Secondary | ICD-10-CM | POA: Diagnosis not present

## 2018-09-09 DIAGNOSIS — E782 Mixed hyperlipidemia: Secondary | ICD-10-CM | POA: Diagnosis not present

## 2018-10-07 DIAGNOSIS — E1169 Type 2 diabetes mellitus with other specified complication: Secondary | ICD-10-CM | POA: Diagnosis not present

## 2018-10-07 DIAGNOSIS — E782 Mixed hyperlipidemia: Secondary | ICD-10-CM | POA: Diagnosis not present

## 2018-10-07 DIAGNOSIS — E876 Hypokalemia: Secondary | ICD-10-CM | POA: Diagnosis not present

## 2018-10-07 DIAGNOSIS — I10 Essential (primary) hypertension: Secondary | ICD-10-CM | POA: Diagnosis not present

## 2018-10-07 DIAGNOSIS — E1165 Type 2 diabetes mellitus with hyperglycemia: Secondary | ICD-10-CM | POA: Diagnosis not present

## 2018-10-11 DIAGNOSIS — I110 Hypertensive heart disease with heart failure: Secondary | ICD-10-CM | POA: Diagnosis not present

## 2018-10-11 DIAGNOSIS — E782 Mixed hyperlipidemia: Secondary | ICD-10-CM | POA: Diagnosis not present

## 2018-10-11 DIAGNOSIS — E1169 Type 2 diabetes mellitus with other specified complication: Secondary | ICD-10-CM | POA: Diagnosis not present

## 2018-10-11 DIAGNOSIS — K219 Gastro-esophageal reflux disease without esophagitis: Secondary | ICD-10-CM | POA: Diagnosis not present

## 2018-10-14 DIAGNOSIS — E1169 Type 2 diabetes mellitus with other specified complication: Secondary | ICD-10-CM | POA: Diagnosis not present

## 2018-10-14 DIAGNOSIS — R112 Nausea with vomiting, unspecified: Secondary | ICD-10-CM | POA: Diagnosis not present

## 2018-10-14 DIAGNOSIS — I2510 Atherosclerotic heart disease of native coronary artery without angina pectoris: Secondary | ICD-10-CM | POA: Diagnosis not present

## 2018-10-14 DIAGNOSIS — I10 Essential (primary) hypertension: Secondary | ICD-10-CM | POA: Diagnosis not present

## 2018-10-14 DIAGNOSIS — E782 Mixed hyperlipidemia: Secondary | ICD-10-CM | POA: Diagnosis not present

## 2018-10-15 ENCOUNTER — Telehealth: Payer: Self-pay | Admitting: *Deleted

## 2018-10-15 NOTE — Telephone Encounter (Signed)
-----   Message from Laqueta Linden, MD sent at 10/14/2018  4:43 PM EDT ----- LDL not at goal. If he is taking Crestor 5 mg once per week, please instruct him to take it every other day and repeat lipids in 2 months.

## 2018-10-19 DIAGNOSIS — Z0000 Encounter for general adult medical examination without abnormal findings: Secondary | ICD-10-CM | POA: Diagnosis not present

## 2018-10-19 NOTE — Telephone Encounter (Signed)
No answer no VM  - send MyChart message

## 2018-11-09 ENCOUNTER — Telehealth: Payer: Self-pay

## 2018-11-09 NOTE — Telephone Encounter (Signed)
Virtual Visit Pre-Appointment Phone Call  "(Name), I am calling you today to discuss your upcoming appointment. We are currently trying to limit exposure to the virus that causes COVID-19 by seeing patients at home rather than in the office."  1. "What is the BEST phone number to call the day of the visit?" - include this in appointment notes  2. "Do you have or have access to (through a family member/friend) a smartphone with video capability that we can use for your visit?" a. If yes - list this number in appt notes as "cell" (if different from BEST phone #) and list the appointment type as a VIDEO visit in appointment notes b. If no - list the appointment type as a PHONE visit in appointment notes  3. Confirm consent - "In the setting of the current Covid19 crisis, you are scheduled for a (phone or video) visit with your provider on (date) at (time).  Just as we do with many in-office visits, in order for you to participate in this visit, we must obtain consent.  If you'd like, I can send this to your mychart (if signed up) or email for you to review.  Otherwise, I can obtain your verbal consent now.  All virtual visits are billed to your insurance company just like a normal visit would be.  By agreeing to a virtual visit, we'd like you to understand that the technology does not allow for your provider to perform an examination, and thus may limit your provider's ability to fully assess your condition. If your provider identifies any concerns that need to be evaluated in person, we will make arrangements to do so.  Finally, though the technology is pretty good, we cannot assure that it will always work on either your or our end, and in the setting of a video visit, we may have to convert it to a phone-only visit.  In either situation, we cannot ensure that we have a secure connection.  Are you willing to proceed?" STAFF: Did the patient verbally acknowledge consent to telehealth visit? Document  YES/NO here: YES   4. Advise patient to be prepared - "Two hours prior to your appointment, go ahead and check your blood pressure, pulse, oxygen saturation, and your weight (if you have the equipment to check those) and write them all down. When your visit starts, your provider will ask you for this information. If you have an Apple Watch or Kardia device, please plan to have heart rate information ready on the day of your appointment. Please have a pen and paper handy nearby the day of the visit as well."  5. Give patient instructions for MyChart download to smartphone OR Doximity/Doxy.me as below if video visit (depending on what platform provider is using)  6. Inform patient they will receive a phone call 15 minutes prior to their appointment time (may be from unknown caller ID) so they should be prepared to answer    TELEPHONE CALL NOTE  Russell Morrow has been deemed a candidate for a follow-up tele-health visit to limit community exposure during the Covid-19 pandemic. I spoke with the patient via phone to ensure availability of phone/video source, confirm preferred email & phone number, and discuss instructions and expectations.  I reminded Russell Morrow to be prepared with any vital sign and/or heart rhythm information that could potentially be obtained via home monitoring, at the time of his visit. I reminded Russell Morrow to expect a phone call prior  to his visit.  Fonnie BirkenheadLisa R Jordani Nunn, CMA 11/09/2018 12:56 PM   INSTRUCTIONS FOR DOWNLOADING THE MYCHART APP TO SMARTPHONE  - The patient must first make sure to have activated MyChart and know their login information - If Apple, go to [employer] Store and type in MyChart in the search bar and download the app. If Android, ask patient to go to [employer] Play Store and type in [facility] in the search bar and download the app. The app is free but as with any other app downloads, their phone may require them to verify saved payment information or Apple/Android password.   - The patient will need to then log into the app with their MyChart username and password, and select [facility] as their healthcare provider to link the account. When it is time for your visit, go to the MyChart app, find appointments, and click Begin Video Visit. Be sure to Select Allow for your device to access the Microphone and Camera for your visit. You will then be connected, and your provider will be with you shortly.  **If they have any issues connecting, or need assistance please contact MyChart service desk [phone])**  **If using a computer, in order to ensure the best quality for their visit they will need to use either of the following Internet Browsers: [employer] Edge, or Google Chrome**  IF USING DOXIMITY or DOXY.ME - The patient will receive a link just prior to their visit by text.     FULL LENGTH CONSENT FOR TELE-HEALTH VISIT   I hereby voluntarily request, consent and authorize [facility] and its employed or contracted physicians, physician assistants, nurse practitioners or other licensed health care professionals (the Practitioner), to provide me with telemedicine health care services (the "Services") as deemed necessary by the treating Practitioner. I acknowledge and consent to receive the Services by the Practitioner via telemedicine. I understand that the telemedicine visit will involve communicating with the Practitioner through live audiovisual communication technology and the disclosure of certain medical information by electronic transmission. I acknowledge that I have been given the opportunity to request an in-person assessment or other available alternative prior to the telemedicine visit and am voluntarily participating in the telemedicine visit.  I understand that I have the right to withhold or withdraw my consent to the use of telemedicine in the course of my care at any time, without affecting my right to future care or treatment, and that  the Practitioner or I may terminate the telemedicine visit at any time. I understand that I have the right to inspect all information obtained and/or recorded in the course of the telemedicine visit and may receive copies of available information for a reasonable fee.  I understand that some of the potential risks of receiving the Services via telemedicine include:  Marland Kitchen. Delay or interruption in medical evaluation due to technological equipment failure or disruption; . Information transmitted may not be sufficient (e.g. poor resolution of images) to allow for appropriate medical decision making by the Practitioner; and/or  . In rare instances, security protocols could fail, causing a breach of personal health information.  Furthermore, I acknowledge that it is my responsibility to provide information about my medical history, conditions and care that is complete and accurate to the best of my ability. I acknowledge that Practitioner's advice, recommendations, and/or decision may be based on factors not within their control, such as incomplete or inaccurate data provided by me or distortions of diagnostic images or specimens that may result from electronic  transmissions. I understand that the practice of medicine is not an exact science and that Practitioner makes no warranties or guarantees regarding treatment outcomes. I acknowledge that I will receive a copy of this consent concurrently upon execution via email to the email address I last provided but may also request a printed copy by calling the office of [place].    I understand that my insurance will be billed for this visit.   I have read or had this consent read to me. . I understand the contents of this consent, which adequately explains the benefits and risks of the Services being provided via telemedicine.  . I have been provided ample opportunity to ask questions regarding this consent and the Services and have had my questions answered to  my satisfaction. . I give my informed consent for the services to be provided through the use of telemedicine in my medical care  By participating in this telemedicine visit I agree to the above.

## 2018-11-10 ENCOUNTER — Telehealth (INDEPENDENT_AMBULATORY_CARE_PROVIDER_SITE_OTHER): Payer: Medicare Other | Admitting: Cardiology

## 2018-11-10 ENCOUNTER — Encounter: Payer: Self-pay | Admitting: Cardiology

## 2018-11-10 ENCOUNTER — Other Ambulatory Visit: Payer: Self-pay

## 2018-11-10 VITALS — BP 132/78 | HR 65 | Ht 74.0 in | Wt 225.0 lb

## 2018-11-10 DIAGNOSIS — I471 Supraventricular tachycardia: Secondary | ICD-10-CM

## 2018-11-10 DIAGNOSIS — I10 Essential (primary) hypertension: Secondary | ICD-10-CM

## 2018-11-10 DIAGNOSIS — I2510 Atherosclerotic heart disease of native coronary artery without angina pectoris: Secondary | ICD-10-CM

## 2018-11-10 DIAGNOSIS — E782 Mixed hyperlipidemia: Secondary | ICD-10-CM

## 2018-11-10 MED ORDER — EVOLOCUMAB 140 MG/ML ~~LOC~~ SOSY: 140.0000 mg | PREFILLED_SYRINGE | SUBCUTANEOUS | 6 refills | Status: DC

## 2018-11-10 NOTE — Progress Notes (Signed)
Virtual Visit via Telephone Note   This visit type was conducted due to national recommendations for restrictions regarding the COVID-19 Pandemic (e.g. social distancing) in an effort to limit this patient's exposure and mitigate transmission in our community.  Due to his co-morbid illnesses, this patient is at least at moderate risk for complications without adequate follow up.  This format is felt to be most appropriate for this patient at this time.  The patient did not have access to video technology/had technical difficulties with video requiring transitioning to audio format only (telephone).  All issues noted in this document were discussed and addressed.  No physical exam could be performed with this format.  Please refer to the patient's chart for his  consent to telehealth for [facility].   Date:  11/10/2018   ID:  Russell Morrow, DOB 12/16/1950, MRN [id number]  Patient Location: Home Provider Location: Home  PCP:  Benita Stabile, MD  Cardiologist:  Dina Rich, MD  Electrophysiologist:  None   Evaluation Performed:  Follow-Up Visit  Chief Complaint:  3 month follow up  History of Present Illness:    Russell Morrow is a 68 y.o. male with seen today for follow up of the following medical problems.   1. CAD - 08/2018 cath: LM patent, ostial LAD 40%, LCX prox occlusion and thrombotic, RCA no significant idsease. S/p DES to LCX 08/2018 echo LVEF 45-50%, inferior/lateral wall hypokinesis.    - no recent chest pain. Mild SOB at times, he thought was related to toprol. He lowered to  daily with some improvement. Breathing is improving with exertion. Does 35-40 minutes walking and tolerating.   2. 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  - no recent palpitations.   4. Kidney lesion   5.Hyperlipidemia - intolerant to multiple statins in the past includin atorva, crestor,  prava, simva, zetia - tried crestor  once a week during last visit - did not tolerate.   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    Past Medical History:  Diagnosis Date  . CAD (coronary artery disease)    a. mild nonobstructive CAD by cath in 2010 b. NST in 11/2015 showing a fixed defect with no ischemia and Coronary CT showing nonobstructive CAD along LAD and LCx c. 08/2018: s/p NSTEMI with cath showing acute total occlusion of LCx (treated with DES) and nonobstructive CAD along RCA and LAD.   . Diabetes mellitus without complication (HCC)   . Diverticulitis   . Hypertension    Past Surgical History:  Procedure Laterality Date  . CERVICAL FUSION    . CORONARY STENT INTERVENTION N/A 08/13/2018   Procedure: CORONARY STENT INTERVENTION;  Surgeon: Tonny Bollman, MD;  Location: [facility] INVASIVE CV LAB;  Service: Cardiovascular;  Laterality: N/A;  . LEFT HEART CATH AND CORONARY ANGIOGRAPHY N/A 08/13/2018   Procedure: LEFT HEART CATH AND CORONARY ANGIOGRAPHY;  Surgeon: Tonny Bollman, MD;  Location: [facility] INVASIVE CV LAB;  Service: Cardiovascular;  Laterality: N/A;  . nasal septum repair       No outpatient medications have been marked as taking for the 11/10/18 encounter (Appointment) with Antoine Poche, MD.     Allergies:  Statins and Zetia [ezetimibe]   Social History   Tobacco Use  . Smoking status: Never Smoker  . Smokeless tobacco: Never Used  Substance Use Topics  . Alcohol use: Not Currently    Alcohol/week: 14.0 standard drinks    Types: 14 Glasses of wine per week    Comment: wine usually daily  . Drug use: Yes    Types: Marijuana     Family Hx: The patient's family history includes Cancer in his mother; Diabetes in his brother and father; Heart disease in his brother and father; Hypertension in his  brother, father, and mother. There is no history of Colon cancer.  ROS:   Please see the history of present illness.     All other systems reviewed and are negative.   Prior CV studies:   The following studies were reviewed today:  08/2018 cath 1.  Severe single-vessel coronary artery disease with acute total occlusion of the left circumflex, treated successfully with PCI using a 3.5 x 16 mm Synergy DES 2.  Nonobstructive LAD and RCA stenoses as outlined 3.  Mild segmental LV systolic dysfunction with LVEF estimated at 45 to 50% with normal LVEDP.  Recommendations: Dual antiplatelet therapy with aspirin and ticagrelor x12 months.  Aggressive post MI medical therapy.  2D echocardiogram tomorrow.  If patient is stable could be considered for discharge home tomorrow after his echo is interpreted.   08/2018 echo IMPRESSIONS    1. Moderate hypokinesis of the left ventricular inferior wall and lateral wall.  2. The left ventricle has mildly reduced systolic function, with an ejection fraction of 45-50%.    Labs/Other Tests and Data Reviewed:    EKG:  No ECG reviewed.  Recent Labs: 07/29/2018: TSH 0.589 08/13/2018: ALT 32; Magnesium 1.9 08/14/2018: BUN 14; Creatinine, Ser 0.82; Hemoglobin 14.5; Platelets 263; Potassium 3.0; Sodium 136   Recent Lipid Panel Lab Results  Component Value Date/Time   CHOL 143 08/14/2018 03:24 AM   TRIG 91 08/14/2018 03:24 AM   HDL 26 (L) 08/14/2018 03:24 AM   CHOLHDL 5.5 08/14/2018 03:24 AM   LDLCALC 99 08/14/2018 03:24 AM    Wt Readings from Last 3 Encounters:  08/17/18 221 lb (100.2 kg)  08/14/18 220 lb 3.8 oz (99.9 kg)  07/30/18 218 lb 4.1 oz (99 kg)     Objective:    Vital Signs:   Today's Vitals   11/10/18 1021  BP: 132/78  Pulse: 65  Weight: 225 lb (102.1 kg)  Height: 6\' 2"  (1.88 m)   Body mass index is 28.89 kg/m.  Normal affect. Normal speech pattern and tone. Comfortable, no apparent distress. No audible signs of SOB or  wheezing.   ASSESSMENT & PLAN:    1. CAD - no recent symptoms, continue current meds including DAPT at least until 08/2019  2. PSVT/ATACH - no recent symptoms, he lowered toprol on his own to 50mg  daily due to SOB, no increase in palpitations with change, continue current dose  3. Hyperlipidemia - intolerant to statins and zetia, we will see if we can get him on repatha    COVID-19 Education: The signs and symptoms of COVID-19 were discussed with the patient and how to seek care for testing (follow up with PCP or arrange E-visit).  The importance of social distancing was discussed today.  Time:   Today, I have spent 17 minutes with the patient with telehealth technology discussing the above problems.     Medication Adjustments/Labs and Tests Ordered: Current medicines are  reviewed at length with the patient today.  Concerns regarding medicines are outlined above.   Tests Ordered: No orders of the defined types were placed in this encounter.   Medication Changes: No orders of the defined types were placed in this encounter.   Disposition:  Follow up 6 months  Signed, Dina Rich, MD  11/10/2018 8:49 AM    [facility]

## 2018-11-10 NOTE — Progress Notes (Signed)
Medication Instructions:  START REPATHA - 140 MG INJECTIONS EVERY 2 WEEKS   Labwork: NONE  Testing/Procedures: NONE  Follow-Up: Your physician wants you to follow-up in: 6 MONTHS. You will receive a reminder letter in the mail two months in advance. If you don't receive a letter, please call our office to schedule the follow-up appointment.  Any Other Special Instructions Will Be Listed Below (If Applicable).     If you need a refill on your cardiac medications before your next appointment, please call your pharmacy.

## 2018-11-10 NOTE — Addendum Note (Signed)
Addended byAbelino Derrick R on: 11/10/2018 11:18 AM   Modules accepted: Orders

## 2018-11-18 ENCOUNTER — Other Ambulatory Visit: Payer: Self-pay

## 2018-11-18 NOTE — Patient Outreach (Signed)
[facility]) Care Management  11/18/2018  Russell Morrow [birth year] [id number]  Medication Adherence call to [name] patient did not answer voice mail is not set up to leave messages, patient is due on Olmesartan/Amlodipine/Hctz 40/5/25 under [place].

## 2018-11-19 DIAGNOSIS — K219 Gastro-esophageal reflux disease without esophagitis: Secondary | ICD-10-CM | POA: Diagnosis not present

## 2018-11-19 DIAGNOSIS — I110 Hypertensive heart disease with heart failure: Secondary | ICD-10-CM | POA: Diagnosis not present

## 2018-11-19 DIAGNOSIS — E782 Mixed hyperlipidemia: Secondary | ICD-10-CM | POA: Diagnosis not present

## 2018-11-19 DIAGNOSIS — E1169 Type 2 diabetes mellitus with other specified complication: Secondary | ICD-10-CM | POA: Diagnosis not present

## 2018-12-14 ENCOUNTER — Telehealth: Payer: Self-pay | Admitting: Cardiology

## 2018-12-14 DIAGNOSIS — K219 Gastro-esophageal reflux disease without esophagitis: Secondary | ICD-10-CM | POA: Diagnosis not present

## 2018-12-14 DIAGNOSIS — I110 Hypertensive heart disease with heart failure: Secondary | ICD-10-CM | POA: Diagnosis not present

## 2018-12-14 DIAGNOSIS — E1169 Type 2 diabetes mellitus with other specified complication: Secondary | ICD-10-CM | POA: Diagnosis not present

## 2018-12-14 DIAGNOSIS — E782 Mixed hyperlipidemia: Secondary | ICD-10-CM | POA: Diagnosis not present

## 2018-12-14 NOTE — Telephone Encounter (Signed)
Mail box full-cc 

## 2018-12-14 NOTE — Telephone Encounter (Signed)
Please give pt a call concerning medication changes.

## 2018-12-15 ENCOUNTER — Other Ambulatory Visit: Payer: Self-pay

## 2018-12-15 NOTE — Patient Outreach (Signed)
[facility]) Care Management  12/15/2018  BALIN VANDEGRIFT 08-09-1950 [id number]   Medication Adherence call to Mr. Eri Platten HIPPA Compliant Voice message left with a call back number. Mr. Grantham is past due under [place].  [place] Management Direct Dial [phone]  Fax [phone] [email or web address]

## 2018-12-16 NOTE — Telephone Encounter (Signed)
Called pt. No answer, voicemail box full.

## 2018-12-21 NOTE — Telephone Encounter (Signed)
Patient says he has been SOB since March since taking Brilinta. And ,he takes Metoprolol 75 mg daily and his HR is 75-90.He wonders if you want to increase it ?

## 2018-12-22 DIAGNOSIS — E1169 Type 2 diabetes mellitus with other specified complication: Secondary | ICD-10-CM | POA: Diagnosis not present

## 2018-12-22 DIAGNOSIS — K8020 Calculus of gallbladder without cholecystitis without obstruction: Secondary | ICD-10-CM | POA: Diagnosis not present

## 2018-12-22 DIAGNOSIS — R141 Gas pain: Secondary | ICD-10-CM | POA: Diagnosis not present

## 2018-12-22 DIAGNOSIS — E876 Hypokalemia: Secondary | ICD-10-CM | POA: Diagnosis not present

## 2018-12-22 DIAGNOSIS — R140 Abdominal distension (gaseous): Secondary | ICD-10-CM | POA: Diagnosis not present

## 2018-12-22 DIAGNOSIS — Z712 Person consulting for explanation of examination or test findings: Secondary | ICD-10-CM | POA: Diagnosis not present

## 2018-12-22 MED ORDER — CLOPIDOGREL BISULFATE 75 MG PO TABS: ORAL_TABLET | ORAL | 3 refills | Status: DC

## 2018-12-22 NOTE — Telephone Encounter (Signed)
Pt notified and voiced understanding 

## 2018-12-22 NOTE — Telephone Encounter (Signed)
Brillinta very commonly can cause SOB. If ongoing we can change him to plavix, would take 300mg  x 1 day then 75mg  daily. Should stay on brillinta until he has the plavix to start at home, there should not be a day he does not take either one and also should not be a day where he takes both on the same day. Heart rates 70s-90s is ok   Zandra Abts MD MD

## 2019-01-14 DIAGNOSIS — I110 Hypertensive heart disease with heart failure: Secondary | ICD-10-CM | POA: Diagnosis not present

## 2019-01-14 DIAGNOSIS — E1169 Type 2 diabetes mellitus with other specified complication: Secondary | ICD-10-CM | POA: Diagnosis not present

## 2019-01-14 DIAGNOSIS — E782 Mixed hyperlipidemia: Secondary | ICD-10-CM | POA: Diagnosis not present

## 2019-01-14 DIAGNOSIS — K219 Gastro-esophageal reflux disease without esophagitis: Secondary | ICD-10-CM | POA: Diagnosis not present

## 2019-01-17 DIAGNOSIS — H2513 Age-related nuclear cataract, bilateral: Secondary | ICD-10-CM | POA: Diagnosis not present

## 2019-01-17 DIAGNOSIS — E119 Type 2 diabetes mellitus without complications: Secondary | ICD-10-CM | POA: Diagnosis not present

## 2019-01-17 DIAGNOSIS — I10 Essential (primary) hypertension: Secondary | ICD-10-CM | POA: Diagnosis not present

## 2019-01-21 DIAGNOSIS — E119 Type 2 diabetes mellitus without complications: Secondary | ICD-10-CM | POA: Diagnosis not present

## 2019-01-21 DIAGNOSIS — I10 Essential (primary) hypertension: Secondary | ICD-10-CM | POA: Diagnosis not present

## 2019-01-21 DIAGNOSIS — H2513 Age-related nuclear cataract, bilateral: Secondary | ICD-10-CM | POA: Diagnosis not present

## 2019-01-21 DIAGNOSIS — H2512 Age-related nuclear cataract, left eye: Secondary | ICD-10-CM | POA: Diagnosis not present

## 2019-01-25 DIAGNOSIS — R112 Nausea with vomiting, unspecified: Secondary | ICD-10-CM | POA: Diagnosis not present

## 2019-01-25 DIAGNOSIS — E1169 Type 2 diabetes mellitus with other specified complication: Secondary | ICD-10-CM | POA: Diagnosis not present

## 2019-01-25 DIAGNOSIS — E782 Mixed hyperlipidemia: Secondary | ICD-10-CM | POA: Diagnosis not present

## 2019-01-25 DIAGNOSIS — I2510 Atherosclerotic heart disease of native coronary artery without angina pectoris: Secondary | ICD-10-CM | POA: Diagnosis not present

## 2019-01-25 DIAGNOSIS — I10 Essential (primary) hypertension: Secondary | ICD-10-CM | POA: Diagnosis not present

## 2019-02-10 DIAGNOSIS — H2512 Age-related nuclear cataract, left eye: Secondary | ICD-10-CM | POA: Diagnosis not present

## 2019-02-11 DIAGNOSIS — H2511 Age-related nuclear cataract, right eye: Secondary | ICD-10-CM | POA: Diagnosis not present

## 2019-02-16 DIAGNOSIS — K219 Gastro-esophageal reflux disease without esophagitis: Secondary | ICD-10-CM | POA: Diagnosis not present

## 2019-02-16 DIAGNOSIS — E1169 Type 2 diabetes mellitus with other specified complication: Secondary | ICD-10-CM | POA: Diagnosis not present

## 2019-02-16 DIAGNOSIS — I110 Hypertensive heart disease with heart failure: Secondary | ICD-10-CM | POA: Diagnosis not present

## 2019-02-16 DIAGNOSIS — E782 Mixed hyperlipidemia: Secondary | ICD-10-CM | POA: Diagnosis not present

## 2019-02-22 DIAGNOSIS — L723 Sebaceous cyst: Secondary | ICD-10-CM | POA: Diagnosis not present

## 2019-02-22 DIAGNOSIS — I10 Essential (primary) hypertension: Secondary | ICD-10-CM | POA: Diagnosis not present

## 2019-02-22 DIAGNOSIS — E1169 Type 2 diabetes mellitus with other specified complication: Secondary | ICD-10-CM | POA: Diagnosis not present

## 2019-03-03 DIAGNOSIS — H2511 Age-related nuclear cataract, right eye: Secondary | ICD-10-CM | POA: Diagnosis not present

## 2019-03-22 DIAGNOSIS — I10 Essential (primary) hypertension: Secondary | ICD-10-CM | POA: Diagnosis not present

## 2019-03-22 DIAGNOSIS — R112 Nausea with vomiting, unspecified: Secondary | ICD-10-CM | POA: Diagnosis not present

## 2019-03-22 DIAGNOSIS — E1169 Type 2 diabetes mellitus with other specified complication: Secondary | ICD-10-CM | POA: Diagnosis not present

## 2019-03-22 DIAGNOSIS — E782 Mixed hyperlipidemia: Secondary | ICD-10-CM | POA: Diagnosis not present

## 2019-03-22 DIAGNOSIS — I2510 Atherosclerotic heart disease of native coronary artery without angina pectoris: Secondary | ICD-10-CM | POA: Diagnosis not present

## 2019-05-03 DIAGNOSIS — Z23 Encounter for immunization: Secondary | ICD-10-CM | POA: Diagnosis not present

## 2019-05-03 DIAGNOSIS — E1165 Type 2 diabetes mellitus with hyperglycemia: Secondary | ICD-10-CM | POA: Diagnosis not present

## 2019-05-03 DIAGNOSIS — R600 Localized edema: Secondary | ICD-10-CM | POA: Diagnosis not present

## 2019-05-26 DIAGNOSIS — E1169 Type 2 diabetes mellitus with other specified complication: Secondary | ICD-10-CM | POA: Diagnosis not present

## 2019-05-26 DIAGNOSIS — R112 Nausea with vomiting, unspecified: Secondary | ICD-10-CM | POA: Diagnosis not present

## 2019-05-26 DIAGNOSIS — I2510 Atherosclerotic heart disease of native coronary artery without angina pectoris: Secondary | ICD-10-CM | POA: Diagnosis not present

## 2019-05-26 DIAGNOSIS — E7849 Other hyperlipidemia: Secondary | ICD-10-CM | POA: Diagnosis not present

## 2019-05-26 DIAGNOSIS — I10 Essential (primary) hypertension: Secondary | ICD-10-CM | POA: Diagnosis not present

## 2019-06-28 DIAGNOSIS — I10 Essential (primary) hypertension: Secondary | ICD-10-CM | POA: Diagnosis not present

## 2019-06-28 DIAGNOSIS — E782 Mixed hyperlipidemia: Secondary | ICD-10-CM | POA: Diagnosis not present

## 2019-06-28 DIAGNOSIS — I2510 Atherosclerotic heart disease of native coronary artery without angina pectoris: Secondary | ICD-10-CM | POA: Diagnosis not present

## 2019-06-28 DIAGNOSIS — E1169 Type 2 diabetes mellitus with other specified complication: Secondary | ICD-10-CM | POA: Diagnosis not present

## 2019-06-28 DIAGNOSIS — R112 Nausea with vomiting, unspecified: Secondary | ICD-10-CM | POA: Diagnosis not present

## 2019-07-04 DIAGNOSIS — E876 Hypokalemia: Secondary | ICD-10-CM | POA: Diagnosis not present

## 2019-07-04 DIAGNOSIS — R141 Gas pain: Secondary | ICD-10-CM | POA: Diagnosis not present

## 2019-07-04 DIAGNOSIS — K8020 Calculus of gallbladder without cholecystitis without obstruction: Secondary | ICD-10-CM | POA: Diagnosis not present

## 2019-07-04 DIAGNOSIS — Z712 Person consulting for explanation of examination or test findings: Secondary | ICD-10-CM | POA: Diagnosis not present

## 2019-07-04 DIAGNOSIS — L723 Sebaceous cyst: Secondary | ICD-10-CM | POA: Diagnosis not present

## 2019-07-04 DIAGNOSIS — E1169 Type 2 diabetes mellitus with other specified complication: Secondary | ICD-10-CM | POA: Diagnosis not present

## 2019-07-07 DIAGNOSIS — E1169 Type 2 diabetes mellitus with other specified complication: Secondary | ICD-10-CM | POA: Diagnosis not present

## 2019-07-07 DIAGNOSIS — I10 Essential (primary) hypertension: Secondary | ICD-10-CM | POA: Diagnosis not present

## 2019-07-07 DIAGNOSIS — E782 Mixed hyperlipidemia: Secondary | ICD-10-CM | POA: Diagnosis not present

## 2019-07-07 DIAGNOSIS — I2510 Atherosclerotic heart disease of native coronary artery without angina pectoris: Secondary | ICD-10-CM | POA: Diagnosis not present

## 2019-07-28 DIAGNOSIS — I10 Essential (primary) hypertension: Secondary | ICD-10-CM | POA: Diagnosis not present

## 2019-07-28 DIAGNOSIS — I2510 Atherosclerotic heart disease of native coronary artery without angina pectoris: Secondary | ICD-10-CM | POA: Diagnosis not present

## 2019-07-28 DIAGNOSIS — E1169 Type 2 diabetes mellitus with other specified complication: Secondary | ICD-10-CM | POA: Diagnosis not present

## 2019-07-28 DIAGNOSIS — R112 Nausea with vomiting, unspecified: Secondary | ICD-10-CM | POA: Diagnosis not present

## 2019-07-28 DIAGNOSIS — E782 Mixed hyperlipidemia: Secondary | ICD-10-CM | POA: Diagnosis not present

## 2019-08-23 DIAGNOSIS — E782 Mixed hyperlipidemia: Secondary | ICD-10-CM | POA: Diagnosis not present

## 2019-08-23 DIAGNOSIS — E1169 Type 2 diabetes mellitus with other specified complication: Secondary | ICD-10-CM | POA: Diagnosis not present

## 2019-08-23 DIAGNOSIS — I2510 Atherosclerotic heart disease of native coronary artery without angina pectoris: Secondary | ICD-10-CM | POA: Diagnosis not present

## 2019-08-23 DIAGNOSIS — R112 Nausea with vomiting, unspecified: Secondary | ICD-10-CM | POA: Diagnosis not present

## 2019-08-23 DIAGNOSIS — I10 Essential (primary) hypertension: Secondary | ICD-10-CM | POA: Diagnosis not present

## 2019-09-21 DIAGNOSIS — I2510 Atherosclerotic heart disease of native coronary artery without angina pectoris: Secondary | ICD-10-CM | POA: Diagnosis not present

## 2019-09-21 DIAGNOSIS — I10 Essential (primary) hypertension: Secondary | ICD-10-CM | POA: Diagnosis not present

## 2019-09-21 DIAGNOSIS — E782 Mixed hyperlipidemia: Secondary | ICD-10-CM | POA: Diagnosis not present

## 2019-09-21 DIAGNOSIS — E1169 Type 2 diabetes mellitus with other specified complication: Secondary | ICD-10-CM | POA: Diagnosis not present

## 2019-09-21 DIAGNOSIS — R112 Nausea with vomiting, unspecified: Secondary | ICD-10-CM | POA: Diagnosis not present

## 2019-10-03 DIAGNOSIS — S91209A Unspecified open wound of unspecified toe(s) with damage to nail, initial encounter: Secondary | ICD-10-CM | POA: Diagnosis not present

## 2019-10-03 DIAGNOSIS — M79646 Pain in unspecified finger(s): Secondary | ICD-10-CM | POA: Diagnosis not present

## 2019-10-05 DIAGNOSIS — M79645 Pain in left finger(s): Secondary | ICD-10-CM | POA: Diagnosis not present

## 2019-10-05 DIAGNOSIS — M79644 Pain in right finger(s): Secondary | ICD-10-CM | POA: Diagnosis not present

## 2019-11-04 DIAGNOSIS — E1165 Type 2 diabetes mellitus with hyperglycemia: Secondary | ICD-10-CM | POA: Diagnosis not present

## 2019-11-04 DIAGNOSIS — E876 Hypokalemia: Secondary | ICD-10-CM | POA: Diagnosis not present

## 2019-11-04 DIAGNOSIS — E7849 Other hyperlipidemia: Secondary | ICD-10-CM | POA: Diagnosis not present

## 2019-11-04 DIAGNOSIS — E782 Mixed hyperlipidemia: Secondary | ICD-10-CM | POA: Diagnosis not present

## 2019-11-04 DIAGNOSIS — E1169 Type 2 diabetes mellitus with other specified complication: Secondary | ICD-10-CM | POA: Diagnosis not present

## 2019-11-15 DIAGNOSIS — E782 Mixed hyperlipidemia: Secondary | ICD-10-CM | POA: Diagnosis not present

## 2019-11-15 DIAGNOSIS — I2510 Atherosclerotic heart disease of native coronary artery without angina pectoris: Secondary | ICD-10-CM | POA: Diagnosis not present

## 2019-11-15 DIAGNOSIS — D509 Iron deficiency anemia, unspecified: Secondary | ICD-10-CM | POA: Diagnosis not present

## 2019-11-15 DIAGNOSIS — E7849 Other hyperlipidemia: Secondary | ICD-10-CM | POA: Diagnosis not present

## 2019-11-15 DIAGNOSIS — I10 Essential (primary) hypertension: Secondary | ICD-10-CM | POA: Diagnosis not present

## 2019-11-15 DIAGNOSIS — E1169 Type 2 diabetes mellitus with other specified complication: Secondary | ICD-10-CM | POA: Diagnosis not present

## 2019-11-15 DIAGNOSIS — Z0001 Encounter for general adult medical examination with abnormal findings: Secondary | ICD-10-CM | POA: Diagnosis not present

## 2019-11-15 DIAGNOSIS — E1165 Type 2 diabetes mellitus with hyperglycemia: Secondary | ICD-10-CM | POA: Diagnosis not present

## 2019-11-22 ENCOUNTER — Encounter: Payer: Self-pay | Admitting: Gastroenterology

## 2019-12-05 ENCOUNTER — Emergency Department (HOSPITAL_COMMUNITY): Payer: Medicare Other

## 2019-12-05 ENCOUNTER — Encounter (HOSPITAL_COMMUNITY): Payer: Self-pay | Admitting: *Deleted

## 2019-12-05 ENCOUNTER — Inpatient Hospital Stay (HOSPITAL_COMMUNITY)
Admission: EM | Admit: 2019-12-05 | Discharge: 2019-12-07 | DRG: 637 | Disposition: A | Discharge status: Home / Self Care | Payer: Medicare Other | Attending: Family Medicine | Admitting: Family Medicine

## 2019-12-05 ENCOUNTER — Other Ambulatory Visit: Payer: Self-pay

## 2019-12-05 DIAGNOSIS — R0902 Hypoxemia: Secondary | ICD-10-CM | POA: Diagnosis not present

## 2019-12-05 DIAGNOSIS — Z7982 Long term (current) use of aspirin: Secondary | ICD-10-CM

## 2019-12-05 DIAGNOSIS — I2510 Atherosclerotic heart disease of native coronary artery without angina pectoris: Secondary | ICD-10-CM | POA: Diagnosis not present

## 2019-12-05 DIAGNOSIS — K760 Fatty (change of) liver, not elsewhere classified: Secondary | ICD-10-CM | POA: Diagnosis present

## 2019-12-05 DIAGNOSIS — E11649 Type 2 diabetes mellitus with hypoglycemia without coma: Secondary | ICD-10-CM | POA: Diagnosis not present

## 2019-12-05 DIAGNOSIS — E663 Overweight: Secondary | ICD-10-CM | POA: Diagnosis present

## 2019-12-05 DIAGNOSIS — G9341 Metabolic encephalopathy: Secondary | ICD-10-CM | POA: Diagnosis present

## 2019-12-05 DIAGNOSIS — R109 Unspecified abdominal pain: Secondary | ICD-10-CM | POA: Diagnosis present

## 2019-12-05 DIAGNOSIS — R0602 Shortness of breath: Secondary | ICD-10-CM | POA: Diagnosis not present

## 2019-12-05 DIAGNOSIS — Z743 Need for continuous supervision: Secondary | ICD-10-CM | POA: Diagnosis not present

## 2019-12-05 DIAGNOSIS — Z20822 Contact with and (suspected) exposure to covid-19: Secondary | ICD-10-CM | POA: Diagnosis not present

## 2019-12-05 DIAGNOSIS — E872 Acidosis, unspecified: Secondary | ICD-10-CM

## 2019-12-05 DIAGNOSIS — G4733 Obstructive sleep apnea (adult) (pediatric): Secondary | ICD-10-CM | POA: Diagnosis not present

## 2019-12-05 DIAGNOSIS — Z8249 Family history of ischemic heart disease and other diseases of the circulatory system: Secondary | ICD-10-CM | POA: Diagnosis not present

## 2019-12-05 DIAGNOSIS — I161 Hypertensive emergency: Secondary | ICD-10-CM | POA: Diagnosis not present

## 2019-12-05 DIAGNOSIS — E1142 Type 2 diabetes mellitus with diabetic polyneuropathy: Secondary | ICD-10-CM | POA: Diagnosis not present

## 2019-12-05 DIAGNOSIS — G4700 Insomnia, unspecified: Secondary | ICD-10-CM | POA: Diagnosis not present

## 2019-12-05 DIAGNOSIS — I951 Orthostatic hypotension: Secondary | ICD-10-CM | POA: Diagnosis not present

## 2019-12-05 DIAGNOSIS — K4090 Unilateral inguinal hernia, without obstruction or gangrene, not specified as recurrent: Secondary | ICD-10-CM | POA: Diagnosis not present

## 2019-12-05 DIAGNOSIS — E1169 Type 2 diabetes mellitus with other specified complication: Secondary | ICD-10-CM | POA: Diagnosis not present

## 2019-12-05 DIAGNOSIS — G4710 Hypersomnia, unspecified: Secondary | ICD-10-CM | POA: Diagnosis present

## 2019-12-05 DIAGNOSIS — E1165 Type 2 diabetes mellitus with hyperglycemia: Principal | ICD-10-CM | POA: Diagnosis present

## 2019-12-05 DIAGNOSIS — R569 Unspecified convulsions: Secondary | ICD-10-CM | POA: Diagnosis not present

## 2019-12-05 DIAGNOSIS — E119 Type 2 diabetes mellitus without complications: Secondary | ICD-10-CM

## 2019-12-05 DIAGNOSIS — Z7902 Long term (current) use of antithrombotics/antiplatelets: Secondary | ICD-10-CM

## 2019-12-05 DIAGNOSIS — R1030 Lower abdominal pain, unspecified: Secondary | ICD-10-CM | POA: Diagnosis not present

## 2019-12-05 DIAGNOSIS — Z955 Presence of coronary angioplasty implant and graft: Secondary | ICD-10-CM | POA: Diagnosis not present

## 2019-12-05 DIAGNOSIS — Z888 Allergy status to other drugs, medicaments and biological substances status: Secondary | ICD-10-CM

## 2019-12-05 DIAGNOSIS — K5730 Diverticulosis of large intestine without perforation or abscess without bleeding: Secondary | ICD-10-CM | POA: Diagnosis not present

## 2019-12-05 DIAGNOSIS — E876 Hypokalemia: Secondary | ICD-10-CM | POA: Diagnosis not present

## 2019-12-05 DIAGNOSIS — Z794 Long term (current) use of insulin: Secondary | ICD-10-CM

## 2019-12-05 DIAGNOSIS — Z79899 Other long term (current) drug therapy: Secondary | ICD-10-CM

## 2019-12-05 DIAGNOSIS — I252 Old myocardial infarction: Secondary | ICD-10-CM

## 2019-12-05 DIAGNOSIS — Z981 Arthrodesis status: Secondary | ICD-10-CM

## 2019-12-05 DIAGNOSIS — Z833 Family history of diabetes mellitus: Secondary | ICD-10-CM

## 2019-12-05 DIAGNOSIS — R4189 Other symptoms and signs involving cognitive functions and awareness: Secondary | ICD-10-CM | POA: Diagnosis not present

## 2019-12-05 DIAGNOSIS — I10 Essential (primary) hypertension: Secondary | ICD-10-CM | POA: Diagnosis not present

## 2019-12-05 DIAGNOSIS — R1013 Epigastric pain: Secondary | ICD-10-CM | POA: Diagnosis not present

## 2019-12-05 DIAGNOSIS — R739 Hyperglycemia, unspecified: Secondary | ICD-10-CM | POA: Diagnosis not present

## 2019-12-05 DIAGNOSIS — G2581 Restless legs syndrome: Secondary | ICD-10-CM | POA: Diagnosis not present

## 2019-12-05 DIAGNOSIS — Z9119 Patient's noncompliance with other medical treatment and regimen: Secondary | ICD-10-CM

## 2019-12-05 DIAGNOSIS — Z03818 Encounter for observation for suspected exposure to other biological agents ruled out: Secondary | ICD-10-CM | POA: Diagnosis not present

## 2019-12-05 DIAGNOSIS — N281 Cyst of kidney, acquired: Secondary | ICD-10-CM | POA: Diagnosis not present

## 2019-12-05 DIAGNOSIS — I700 Atherosclerosis of aorta: Secondary | ICD-10-CM | POA: Diagnosis not present

## 2019-12-05 DIAGNOSIS — Z6831 Body mass index (BMI) 31.0-31.9, adult: Secondary | ICD-10-CM

## 2019-12-05 HISTORY — DX: Calculus of gallbladder without cholecystitis without obstruction: K80.20

## 2019-12-05 HISTORY — DX: Acute myocardial infarction, unspecified: I21.9

## 2019-12-05 LAB — COMPREHENSIVE METABOLIC PANEL
ALT: 45 U/L — ABNORMAL HIGH (ref 0–44)
AST: 27 U/L (ref 15–41)
Albumin: 4.8 g/dL (ref 3.5–5.0)
Alkaline Phosphatase: 87 U/L (ref 38–126)
Anion gap: 16 — ABNORMAL HIGH (ref 5–15)
BUN: 15 mg/dL (ref 8–23)
CO2: 30 mmol/L (ref 22–32)
Calcium: 10.1 mg/dL (ref 8.9–10.3)
Chloride: 90 mmol/L — ABNORMAL LOW (ref 98–111)
Creatinine, Ser: 0.92 mg/dL (ref 0.61–1.24)
GFR calc Af Amer: >60 mL/min (ref >60)
GFR calc non Af Amer: >60 mL/min (ref >60)
Glucose, Bld: 306 mg/dL — ABNORMAL HIGH (ref 70–99)
Potassium: 3.1 mmol/L — ABNORMAL LOW (ref 3.5–5.1)
Sodium: 136 mmol/L (ref 135–145)
Total Bilirubin: 1 mg/dL (ref 0.3–1.2)
Total Protein: 8.6 g/dL — ABNORMAL HIGH (ref 6.5–8.1)

## 2019-12-05 LAB — CBC WITH DIFFERENTIAL/PLATELET
Abs Immature Granulocytes: 0.04 10*3/uL (ref 0.00–0.07)
Basophils Absolute: 0 10*3/uL (ref 0.0–0.1)
Basophils Relative: 0 %
Eosinophils Absolute: 0 10*3/uL (ref 0.0–0.5)
Eosinophils Relative: 0 %
HCT: 48.1 % (ref 39.0–52.0)
Hemoglobin: 14.5 g/dL (ref 13.0–17.0)
Immature Granulocytes: 0 %
Lymphocytes Relative: 11 %
Lymphs Abs: 1.3 10*3/uL (ref 0.7–4.0)
MCH: 22.6 pg — ABNORMAL LOW (ref 26.0–34.0)
MCHC: 30.1 g/dL (ref 30.0–36.0)
MCV: 74.9 fL — ABNORMAL LOW (ref 80.0–100.0)
Monocytes Absolute: 0.7 10*3/uL (ref 0.1–1.0)
Monocytes Relative: 6 %
Neutro Abs: 9.8 10*3/uL — ABNORMAL HIGH (ref 1.7–7.7)
Neutrophils Relative %: 83 %
Platelets: 336 10*3/uL (ref 150–400)
RBC: 6.42 MIL/uL — ABNORMAL HIGH (ref 4.22–5.81)
RDW: 19 % — ABNORMAL HIGH (ref 11.5–15.5)
WBC: 11.8 10*3/uL — ABNORMAL HIGH (ref 4.0–10.5)
nRBC: 0 % (ref 0.0–0.2)

## 2019-12-05 LAB — LACTIC ACID, PLASMA: Lactic Acid, Venous: 3.6 mmol/L (ref 0.5–1.9)

## 2019-12-05 LAB — CBG MONITORING, ED: Glucose-Capillary: 331 mg/dL — ABNORMAL HIGH (ref 70–99)

## 2019-12-05 LAB — LIPASE, BLOOD: Lipase: 23 U/L (ref 11–51)

## 2019-12-05 MED ORDER — PIPERACILLIN-TAZOBACTAM 3.375 G IVPB 30 MIN
3.3750 g | Freq: Once | INTRAVENOUS | Status: AC
  Administered 2019-12-05: 3.375 g via INTRAVENOUS
  Filled 2019-12-05: qty 50

## 2019-12-05 MED ORDER — ADENOSINE 6 MG/2ML IV SOLN
INTRAVENOUS | Status: AC
  Filled 2019-12-05: qty 2

## 2019-12-05 MED ORDER — ROPINIROLE HCL 1 MG PO TABS
1.0000 mg | ORAL_TABLET | Freq: Every day | ORAL | Status: AC
  Administered 2019-12-05: 1 mg via ORAL
  Filled 2019-12-05 (×2): qty 1

## 2019-12-05 MED ORDER — IOHEXOL 300 MG/ML  SOLN
100.0000 mL | Freq: Once | INTRAMUSCULAR | Status: AC | PRN
  Administered 2019-12-05: 100 mL via INTRAVENOUS

## 2019-12-05 MED ORDER — SODIUM CHLORIDE 0.9 % IV BOLUS
1000.0000 mL | Freq: Once | INTRAVENOUS | Status: AC
  Administered 2019-12-05: 1000 mL via INTRAVENOUS

## 2019-12-05 NOTE — ED Notes (Signed)
Date and time results received: 12/05/19 2244 (use smartphrase ".now" to insert current time)  Test: Lactic  Critical Value: 3.6  Name of Provider Notified: Hyacinth Meeker  Orders Received? Or Actions Taken?:

## 2019-12-05 NOTE — ED Triage Notes (Signed)
Pt c/o abd pain with nausea, dry heaves that started this am, elevated blood sugar that has been ongoing, pt reports that he stopped taking some of his diabatic medications about three weeks ago, pt was sitting on the cough tonight when he went to stand up and "passed out". Pt arrived to er alert, able to answer questions,

## 2019-12-05 NOTE — ED Notes (Signed)
Pt very restless. Pt says he has not had his requip tonight. Verbal order from [place] received. Called [facility] for medication

## 2019-12-05 NOTE — ED Provider Notes (Signed)
[facility] EMERGENCY DEPARTMENT Provider Note   CSN: [id number] Arrival date & time: 12/05/19  2004     History Chief Complaint  Patient presents with  . Abdominal Pain    Russell Morrow is a 69 y.o. male.  HPI   69 year old male, known history of coronary disease status post stenting, he is a diabetic currently on Lantus and Metformin, he stopped his other diabetic medications approximately a month or 2 ago though he cannot tell me the exact timing.  He has had intermittent abdominal pain over time for which he has been referred to gastroenterology but is not yet seen him.  He reports this morning having some increasing abdominal pain in the lower umbilical region.  He has also been excessively sleepy and has had a progressive hyperglycemia throughout the day despite taking any food at all.  Symptoms are gradually worsening, no associated diarrhea hematuria dysuria vomiting coughing shortness of breath or chest pain at this time.  The patient denies prior abdominal surgery.  I reviewed the medical record which shows that he had a CT angiogram of the abdomen and pelvis in March 2020 which showed no significant findings other than a renal cell exophytic lesion concerning for renal cell carcinoma.  There was no vascular abnormalities or aneurysms.  He does report a history of diverticula  Past Medical History:  Diagnosis Date  . CAD (coronary artery disease)    a. mild nonobstructive CAD by cath in 2010 b. NST in 11/2015 showing a fixed defect with no ischemia and Coronary CT showing nonobstructive CAD along LAD and LCx c. 08/2018: s/p NSTEMI with cath showing acute total occlusion of LCx (treated with DES) and nonobstructive CAD along RCA and LAD.   . Diabetes mellitus without complication (HCC)   . Diverticulitis   . Gallstones   . Hypertension   . MI (myocardial infarction) [facility])     Patient Active Problem List   Diagnosis Date Noted  . Elevated troponin 08/13/2018  . Hypertensive  emergency 08/13/2018  . CAD (coronary artery disease) 08/13/2018  . NSTEMI (non-ST elevated myocardial infarction) (HCC) 08/13/2018  . Chest pain in adult 07/29/2018  . Atrial tachycardia, paroxysmal (HCC) 07/29/2018  . Atrial tachycardia (HCC)   . Hypokalemia 07/28/2018  . Hypophosphatemia 07/28/2018  . Leukocytosis 07/28/2018  . Type 2 diabetes mellitus (HCC) 07/28/2018  . Umbilical hernia 04/17/2017  . Inguinal hernia, bilateral 04/17/2017  . Cholelithiasis 04/17/2017  . Abdominal pain, epigastric 04/17/2017  . Lesion of both native kidneys 04/17/2017  . Fatty liver 04/17/2017  . Hypertension     Past Surgical History:  Procedure Laterality Date  . CERVICAL FUSION    . CORONARY STENT INTERVENTION N/A 08/13/2018   Procedure: CORONARY STENT INTERVENTION;  Surgeon: Tonny Bollman, MD;  Location: [facility] INVASIVE CV LAB;  Service: Cardiovascular;  Laterality: N/A;  . LEFT HEART CATH AND CORONARY ANGIOGRAPHY N/A 08/13/2018   Procedure: LEFT HEART CATH AND CORONARY ANGIOGRAPHY;  Surgeon: Tonny Bollman, MD;  Location: [facility] INVASIVE CV LAB;  Service: Cardiovascular;  Laterality: N/A;  . nasal septum repair         Family History  Problem Relation Age of Onset  . Cancer Mother        breast  . Hypertension Mother   . Diabetes Father   . Heart disease Father   . Hypertension Father   . Diabetes Brother   . Heart disease Brother   . Hypertension Brother   . Colon cancer Neg Hx  Social History   Tobacco Use  . Smoking status: Never Smoker  . Smokeless tobacco: Never Used  Vaping Use  . Vaping Use: Never used  Substance Use Topics  . Alcohol use: Not Currently    Alcohol/week: 14.0 standard drinks    Types: 14 Glasses of wine per week    Comment: wine usually daily  . Drug use: Yes    Types: Marijuana    Home Medications Prior to Admission medications   Medication Sig Start Date End Date Taking? Authorizing Provider  aspirin EC 81 MG EC tablet Take 1 tablet (81 mg  total) by mouth daily. 08/15/18   Allayne Butcher, PA-C  clopidogrel (PLAVIX) 75 MG tablet Take 300 mg ( 4 Tablets on day one only) Then take 75 mg (1Tablet) Daily 12/22/18   Antoine Poche, MD  dicyclomine (BENTYL) 10 MG capsule Take 10 mg by mouth 4 (four) times daily as needed. 11/18/19   [provider]  diphenhydrAMINE (BENADRYL) 25 mg capsule Take 25 mg by mouth at bedtime.    [provider]  empagliflozin (JARDIANCE) 10 MG TABS tablet Take 10 mg by mouth daily.    [provider]  esomeprazole (NEXIUM) 20 MG capsule Take 20 mg by mouth 2 (two) times daily before a meal.    [provider]  Evolocumab (REPATHA) 140 MG/ML SOSY Inject 140 mg into the skin every 14 (fourteen) days. 11/10/18   Antoine Poche, MD  FEROSUL 325 (65 Fe) MG tablet Take 325 mg by mouth daily. 11/18/19   [provider]  LANTUS SOLOSTAR 100 UNIT/ML Solostar Pen Inject 35 Units into the skin 2 (two) times daily.  10/27/19   [provider]  metFORMIN (GLUCOPHAGE) 1000 MG tablet Take 1 tablet (1,000 mg total) by mouth 2 (two) times daily with a meal. 08/16/18   Robbie Lis M, PA-C  metoprolol succinate (TOPROL-XL) 25 MG 24 hr tablet Take 3 tablets (75 mg total) by mouth daily. Patient taking differently: Take 50 mg by mouth daily.  07/31/18   Shon Hale, MD  nitroGLYCERIN (NITROSTAT) 0.4 MG SL tablet Place 1 tablet (0.4 mg total) under the tongue every 5 (five) minutes x 3 doses as needed for chest pain. 08/14/18   Simmons, Brittainy M, PA-C  Olmesartan-amLODIPine-HCTZ 40-5-25 MG TABS Take 1 tablet by mouth daily. 07/24/18   [provider]  ondansetron (ZOFRAN) 4 MG tablet Take 4 mg by mouth every 6 (six) hours as needed for nausea.  08/02/18   [provider]  OZEMPIC, 0.25 OR 0.5 MG/DOSE, 2 MG/1.5ML SOPN Inject 0.5 mg into the skin once a week. 09/02/19   [provider]  potassium chloride SA (K-DUR,KLOR-CON) 20 MEQ tablet Take  20 mEq by mouth daily.    [provider]  rOPINIRole (REQUIP) 1 MG tablet Take 2 mg by mouth at bedtime. 07/07/18   [provider]  tadalafil (CIALIS) 10 MG tablet Take 10 mg by mouth daily as needed for erectile dysfunction.  11/18/19   [provider]  temazepam (RESTORIL) 15 MG capsule Take 15 mg by mouth at bedtime. 07/07/18   [provider]    Allergies    Statins and Zetia [ezetimibe]  Review of Systems   Review of Systems  All other systems reviewed and are negative.   Physical Exam Updated Vital Signs BP 119/80   Pulse 90   Temp 98.6 F (37 C) (Oral)   Resp 20   Ht 1.854 m (  6\' 1" )   Wt 108.9 kg   SpO2 96%   BMI 31.66 kg/m   Physical Exam Vitals and nursing note reviewed.  Constitutional:      General: He is not in acute distress.    Appearance: He is well-developed.  HENT:     Head: Normocephalic and atraumatic.     Mouth/Throat:     Pharynx: No oropharyngeal exudate.  Eyes:     General: No scleral icterus.       Right eye: No discharge.        Left eye: No discharge.     Conjunctiva/sclera: Conjunctivae normal.     Pupils: Pupils are equal, round, and reactive to light.  Neck:     Thyroid: No thyromegaly.     Vascular: No JVD.  Cardiovascular:     Rate and Rhythm: Normal rate and regular rhythm.     Heart sounds: Normal heart sounds. No murmur heard.  No friction rub. No gallop.   Pulmonary:     Effort: Pulmonary effort is normal. No respiratory distress.     Breath sounds: Normal breath sounds. No wheezing or rales.  Abdominal:     General: Bowel sounds are normal. There is no distension.     Palpations: Abdomen is soft. There is no mass.     Tenderness: There is no abdominal tenderness.     Comments: Diffuse abdominal exam without any specific tenderness, he does not guard, there is no masses, no pulsating masses.  No pain McBurney's point or right upper quadrant tenderness  Musculoskeletal:        General: No  tenderness. Normal range of motion.     Cervical back: Normal range of motion and neck supple.  Lymphadenopathy:     Cervical: No cervical adenopathy.  Skin:    General: Skin is warm and dry.     Findings: No erythema or rash.  Neurological:     Mental Status: He is alert.     Coordination: Coordination normal.  Psychiatric:        Behavior: Behavior normal.     ED Results / Procedures / Treatments   Labs (all labs ordered are listed, but only abnormal results are displayed) Labs Reviewed  CBC WITH DIFFERENTIAL/PLATELET - Abnormal; Notable for the following components:      Result Value   WBC 11.8 (*)    RBC 6.42 (*)    MCV 74.9 (*)    MCH 22.6 (*)    RDW 19.0 (*)    Neutro Abs 9.8 (*)    All other components within normal limits  COMPREHENSIVE METABOLIC PANEL - Abnormal; Notable for the following components:   Potassium 3.1 (*)    Chloride 90 (*)    Glucose, Bld 306 (*)    Total Protein 8.6 (*)    ALT 45 (*)    Anion gap 16 (*)    All other components within normal limits  LACTIC ACID, PLASMA - Abnormal; Notable for the following components:   Lactic Acid, Venous 3.6 (*)    All other components within normal limits  CBG MONITORING, ED - Abnormal; Notable for the following components:   Glucose-Capillary 331 (*)    All other components within normal limits  LIPASE, BLOOD  URINALYSIS, ROUTINE W REFLEX MICROSCOPIC    EKG EKG Interpretation  Date/Time:  Monday December 05 2019 20:14:41 EDT Ventricular Rate:  87 PR Interval:    QRS Duration: 90 QT Interval:  354 QTC Calculation: 426 R Axis:   -  90 Text Interpretation: Sinus arrhythmia Multiple premature complexes, vent & supraven Probable left atrial enlargement Consider right ventricular hypertrophy Inferior infarct, old Lateral leads are also involved Baseline wander in lead(s) I III aVL aVF Confirmed by Noemi Chapel [phone]) on 12/05/2019 9:31:18 PM   Radiology CT ABDOMEN PELVIS W CONTRAST  Result Date:  12/05/2019 CLINICAL DATA:  Abdominal pain, nausea EXAM: CT ABDOMEN AND PELVIS WITH CONTRAST TECHNIQUE: Multidetector CT imaging of the abdomen and pelvis was performed using the standard protocol following bolus administration of intravenous contrast. CONTRAST:  147mL OMNIPAQUE IOHEXOL 300 MG/ML  SOLN COMPARISON:  08/13/2018 FINDINGS: Lower chest: Scarring in the lung bases.  No acute abnormality. Hepatobiliary: Small gallstones layering within the gallbladder. Diffuse fatty infiltration of the liver. No focal hepatic abnormality. Pancreas: No focal abnormality or ductal dilatation. Spleen: No focal abnormality.  Normal size. Adrenals/Urinary Tract: Exophytic intermediate density 1.4 cm lesion off the anterior midpole left kidney, low-density cysts seen within the right mid and lower pole. No hydronephrosis. Adrenal glands and urinary bladder are unremarkable. Intermediate density lesion in the mid pole posteriorly of the left kidney measures 1.9 cm. This previously measured 1.2 cm. Stomach/Bowel: Sigmoid diverticulosis. No active diverticulitis. Normal appendix. Stomach and small bowel decompressed, unremarkable. Vascular/Lymphatic: Aortic atherosclerosis. No enlarged abdominal or pelvic lymph nodes. Reproductive: No visible focal abnormality. Other: No free fluid or free air. Small left inguinal hernia containing fat. Musculoskeletal: No acute bony abnormality. IMPRESSION: Small indeterminate lesions within both kidneys. And indeterminate lesion in the posterior midpole of the left kidney is slightly enlarged since prior study. When the patient is clinically stable and able to follow directions and hold their breath (preferably as an outpatient) further evaluation with dedicated abdominal MRI should be considered. Fatty infiltration of the liver. Cholelithiasis. Aortic atherosclerosis. Sigmoid diverticulosis. Electronically Signed   By: Rolm Baptise M.D.   On: 12/05/2019 23:30   DG Chest Port 1 View  Result  Date: 12/05/2019 CLINICAL DATA:  Shortness of breath EXAM: PORTABLE CHEST 1 VIEW COMPARISON:  None. FINDINGS: Mild bronchitic changes. No focal consolidation or effusion. Borderline heart size. No pneumothorax. IMPRESSION: Mild bronchitic changes. No focal pulmonary infiltrate.  The Electronically Signed   By: Donavan Foil M.D.   On: 12/05/2019 22:17    Procedures Procedures (including critical care time)  Medications Ordered in ED Medications  rOPINIRole (REQUIP) tablet 1 mg (1 mg Oral Given 12/05/19 2208)  iohexol (OMNIPAQUE) 300 MG/ML solution 100 mL (100 mLs Intravenous Contrast Given 12/05/19 2310)  piperacillin-tazobactam (ZOSYN) IVPB 3.375 g (3.375 g Intravenous New Bag/Given 12/05/19 2341)  sodium chloride 0.9 % bolus 1,000 mL (1,000 mLs Intravenous New Bag/Given 12/05/19 2340)    ED Course  I have reviewed the triage vital signs and the nursing notes.  Pertinent labs & imaging results that were available during my care of the patient were reviewed by me and considered in my medical decision making (see chart for details).    MDM Rules/Calculators/A&P                          What is a bizarre about this encounter is that the patient seems to fall asleep midsentence at times.  He denies taking opiates or benzodiazepines.  He has had a glucose of 330, normal vital signs and a really nontender abdomen.  I am not clear exactly where to go with this other than getting some lab work to make sure he does not have DKA and  also making sure he does not have any surgical causes in his abdomen given the slight change in mental status.  No headache, significant other in the room with him does not add much to the work-up either.  This patient has a lactic acidosis and leukocytosis and is hyperglycemic, the CT scan does not show an acute cause of the patient's abdominal pain though it does show some worsening renal lesions.  The patient will be admitted to the hospital, discussed with Dr. Sharl MaLama who is  agreeable.  Antibiotics ordered due to the abnormal labs and possibility for infection, urinalysis pending  Final Clinical Impression(s) / ED Diagnoses Final diagnoses:  Abdominal pain, unspecified abdominal location  Lactic acidosis     Eber HongMiller, Abhi Moccia, MD 12/06/19 0013

## 2019-12-06 ENCOUNTER — Inpatient Hospital Stay (HOSPITAL_COMMUNITY)
Admit: 2019-12-06 | Discharge: 2019-12-06 | Disposition: A | Discharge status: Home / Self Care | Payer: Medicare Other | Attending: Internal Medicine | Admitting: Internal Medicine

## 2019-12-06 ENCOUNTER — Inpatient Hospital Stay (HOSPITAL_COMMUNITY): Payer: Medicare Other

## 2019-12-06 DIAGNOSIS — E1165 Type 2 diabetes mellitus with hyperglycemia: Secondary | ICD-10-CM | POA: Diagnosis present

## 2019-12-06 DIAGNOSIS — R739 Hyperglycemia, unspecified: Secondary | ICD-10-CM | POA: Diagnosis not present

## 2019-12-06 DIAGNOSIS — R569 Unspecified convulsions: Secondary | ICD-10-CM | POA: Diagnosis not present

## 2019-12-06 DIAGNOSIS — G4710 Hypersomnia, unspecified: Secondary | ICD-10-CM | POA: Diagnosis present

## 2019-12-06 DIAGNOSIS — E872 Acidosis: Secondary | ICD-10-CM

## 2019-12-06 DIAGNOSIS — G2581 Restless legs syndrome: Secondary | ICD-10-CM | POA: Diagnosis present

## 2019-12-06 DIAGNOSIS — Z794 Long term (current) use of insulin: Secondary | ICD-10-CM

## 2019-12-06 DIAGNOSIS — Z8249 Family history of ischemic heart disease and other diseases of the circulatory system: Secondary | ICD-10-CM | POA: Diagnosis not present

## 2019-12-06 DIAGNOSIS — I2510 Atherosclerotic heart disease of native coronary artery without angina pectoris: Secondary | ICD-10-CM | POA: Diagnosis not present

## 2019-12-06 DIAGNOSIS — R109 Unspecified abdominal pain: Secondary | ICD-10-CM | POA: Diagnosis not present

## 2019-12-06 DIAGNOSIS — E1169 Type 2 diabetes mellitus with other specified complication: Secondary | ICD-10-CM

## 2019-12-06 DIAGNOSIS — I161 Hypertensive emergency: Secondary | ICD-10-CM | POA: Diagnosis not present

## 2019-12-06 DIAGNOSIS — K760 Fatty (change of) liver, not elsewhere classified: Secondary | ICD-10-CM | POA: Diagnosis present

## 2019-12-06 DIAGNOSIS — E11649 Type 2 diabetes mellitus with hypoglycemia without coma: Secondary | ICD-10-CM | POA: Diagnosis present

## 2019-12-06 DIAGNOSIS — G4700 Insomnia, unspecified: Secondary | ICD-10-CM | POA: Diagnosis present

## 2019-12-06 DIAGNOSIS — I951 Orthostatic hypotension: Secondary | ICD-10-CM | POA: Diagnosis present

## 2019-12-06 DIAGNOSIS — E663 Overweight: Secondary | ICD-10-CM | POA: Diagnosis present

## 2019-12-06 DIAGNOSIS — Z79899 Other long term (current) drug therapy: Secondary | ICD-10-CM | POA: Diagnosis not present

## 2019-12-06 DIAGNOSIS — Z20822 Contact with and (suspected) exposure to covid-19: Secondary | ICD-10-CM | POA: Diagnosis present

## 2019-12-06 DIAGNOSIS — R4189 Other symptoms and signs involving cognitive functions and awareness: Secondary | ICD-10-CM | POA: Diagnosis not present

## 2019-12-06 DIAGNOSIS — E782 Mixed hyperlipidemia: Secondary | ICD-10-CM | POA: Diagnosis not present

## 2019-12-06 DIAGNOSIS — R1013 Epigastric pain: Secondary | ICD-10-CM | POA: Diagnosis not present

## 2019-12-06 DIAGNOSIS — Z981 Arthrodesis status: Secondary | ICD-10-CM | POA: Diagnosis not present

## 2019-12-06 DIAGNOSIS — I10 Essential (primary) hypertension: Secondary | ICD-10-CM | POA: Diagnosis not present

## 2019-12-06 DIAGNOSIS — E1142 Type 2 diabetes mellitus with diabetic polyneuropathy: Secondary | ICD-10-CM | POA: Diagnosis present

## 2019-12-06 DIAGNOSIS — Z0001 Encounter for general adult medical examination with abnormal findings: Secondary | ICD-10-CM | POA: Diagnosis not present

## 2019-12-06 DIAGNOSIS — G4733 Obstructive sleep apnea (adult) (pediatric): Secondary | ICD-10-CM | POA: Diagnosis present

## 2019-12-06 DIAGNOSIS — G9341 Metabolic encephalopathy: Secondary | ICD-10-CM | POA: Diagnosis present

## 2019-12-06 DIAGNOSIS — Z7982 Long term (current) use of aspirin: Secondary | ICD-10-CM | POA: Diagnosis not present

## 2019-12-06 DIAGNOSIS — E876 Hypokalemia: Secondary | ICD-10-CM | POA: Diagnosis present

## 2019-12-06 DIAGNOSIS — Z955 Presence of coronary angioplasty implant and graft: Secondary | ICD-10-CM | POA: Diagnosis not present

## 2019-12-06 DIAGNOSIS — I252 Old myocardial infarction: Secondary | ICD-10-CM | POA: Diagnosis not present

## 2019-12-06 LAB — URINALYSIS, ROUTINE W REFLEX MICROSCOPIC
Bacteria, UA: NONE SEEN
Bilirubin Urine: NEGATIVE
Glucose, UA: >=500 mg/dL — AB
Ketones, ur: 20 mg/dL — AB
Leukocytes,Ua: NEGATIVE
Nitrite: NEGATIVE
Protein, ur: 100 mg/dL — AB
Specific Gravity, Urine: 1.016 (ref 1.005–1.030)
pH: 6 (ref 5.0–8.0)

## 2019-12-06 LAB — BASIC METABOLIC PANEL
Anion gap: 12 (ref 5–15)
BUN: 17 mg/dL (ref 8–23)
CO2: 30 mmol/L (ref 22–32)
Calcium: 9 mg/dL (ref 8.9–10.3)
Chloride: 93 mmol/L — ABNORMAL LOW (ref 98–111)
Creatinine, Ser: 0.99 mg/dL (ref 0.61–1.24)
GFR calc Af Amer: >60 mL/min (ref >60)
GFR calc non Af Amer: >60 mL/min (ref >60)
Glucose, Bld: 247 mg/dL — ABNORMAL HIGH (ref 70–99)
Potassium: 3 mmol/L — ABNORMAL LOW (ref 3.5–5.1)
Sodium: 135 mmol/L (ref 135–145)

## 2019-12-06 LAB — GLUCOSE, CAPILLARY
Glucose-Capillary: 228 mg/dL — ABNORMAL HIGH (ref 70–99)
Glucose-Capillary: 214 mg/dL — ABNORMAL HIGH (ref 70–99)
Glucose-Capillary: 215 mg/dL — ABNORMAL HIGH (ref 70–99)
Glucose-Capillary: 183 mg/dL — ABNORMAL HIGH (ref 70–99)

## 2019-12-06 LAB — HIV ANTIBODY (ROUTINE TESTING W REFLEX): HIV Screen 4th Generation wRfx: NONREACTIVE

## 2019-12-06 LAB — SARS CORONAVIRUS 2 BY RT PCR (HOSPITAL ORDER, PERFORMED IN ~~LOC~~ HOSPITAL LAB): SARS Coronavirus 2: NEGATIVE

## 2019-12-06 LAB — CBC
HCT: 42.5 % (ref 39.0–52.0)
Hemoglobin: 12.8 g/dL — ABNORMAL LOW (ref 13.0–17.0)
MCH: 22.8 pg — ABNORMAL LOW (ref 26.0–34.0)
MCHC: 30.1 g/dL (ref 30.0–36.0)
MCV: 75.6 fL — ABNORMAL LOW (ref 80.0–100.0)
Platelets: 293 10*3/uL (ref 150–400)
RBC: 5.62 MIL/uL (ref 4.22–5.81)
RDW: 18.8 % — ABNORMAL HIGH (ref 11.5–15.5)
WBC: 11.4 10*3/uL — ABNORMAL HIGH (ref 4.0–10.5)
nRBC: 0 % (ref 0.0–0.2)

## 2019-12-06 LAB — PROCALCITONIN: Procalcitonin: <0.1 ng/mL

## 2019-12-06 LAB — CBG MONITORING, ED
Glucose-Capillary: 189 mg/dL — ABNORMAL HIGH (ref 70–99)
Glucose-Capillary: 248 mg/dL — ABNORMAL HIGH (ref 70–99)

## 2019-12-06 LAB — LACTIC ACID, PLASMA: Lactic Acid, Venous: 1.5 mmol/L (ref 0.5–1.9)

## 2019-12-06 LAB — HEMOGLOBIN A1C
Hgb A1c MFr Bld: 8.5 % — ABNORMAL HIGH (ref 4.8–5.6)
Mean Plasma Glucose: 197 mg/dL

## 2019-12-06 MED ORDER — SODIUM CHLORIDE 0.9 % IV SOLN: INTRAVENOUS | Status: AC

## 2019-12-06 MED ORDER — AMLODIPINE BESYLATE 5 MG PO TABS
10.0000 mg | ORAL_TABLET | Freq: Every day | ORAL | Status: DC
  Administered 2019-12-06 – 2019-12-07 (×2): 10 mg via ORAL
  Filled 2019-12-06 (×2): qty 2

## 2019-12-06 MED ORDER — ENOXAPARIN SODIUM 40 MG/0.4ML ~~LOC~~ SOLN
40.0000 mg | SUBCUTANEOUS | Status: DC
  Administered 2019-12-06: 40 mg via SUBCUTANEOUS
  Filled 2019-12-06 (×2): qty 0.4

## 2019-12-06 MED ORDER — INSULIN ASPART 100 UNIT/ML ~~LOC~~ SOLN
0.0000 [IU] | Freq: Three times a day (TID) | SUBCUTANEOUS | Status: DC
  Administered 2019-12-06 – 2019-12-07 (×4): 3 [IU] via SUBCUTANEOUS
  Administered 2019-12-07: 1 [IU] via SUBCUTANEOUS
  Administered 2019-12-07: 3 [IU] via SUBCUTANEOUS

## 2019-12-06 MED ORDER — METOPROLOL SUCCINATE ER 50 MG PO TB24
75.0000 mg | ORAL_TABLET | Freq: Every day | ORAL | Status: DC
  Administered 2019-12-07: 75 mg via ORAL
  Filled 2019-12-06: qty 1

## 2019-12-06 MED ORDER — PREGABALIN 75 MG PO CAPS
75.0000 mg | ORAL_CAPSULE | Freq: Every day | ORAL | Status: DC
  Administered 2019-12-06: 75 mg via ORAL
  Filled 2019-12-06: qty 1

## 2019-12-06 MED ORDER — ACETAMINOPHEN 325 MG PO TABS: 650.0000 mg | ORAL_TABLET | Freq: Four times a day (QID) | ORAL | Status: DC | PRN

## 2019-12-06 MED ORDER — METOPROLOL SUCCINATE ER 50 MG PO TB24
50.0000 mg | ORAL_TABLET | Freq: Every day | ORAL | Status: DC
  Administered 2019-12-06: 50 mg via ORAL
  Filled 2019-12-06: qty 1

## 2019-12-06 MED ORDER — SODIUM CHLORIDE 0.9 % IV SOLN: 250.0000 mL | INTRAVENOUS | Status: DC | PRN

## 2019-12-06 MED ORDER — ASPIRIN EC 81 MG PO TBEC
81.0000 mg | DELAYED_RELEASE_TABLET | Freq: Every day | ORAL | Status: DC
  Administered 2019-12-06 – 2019-12-07 (×2): 81 mg via ORAL
  Filled 2019-12-06 (×2): qty 1

## 2019-12-06 MED ORDER — ROPINIROLE HCL 1 MG PO TABS
2.0000 mg | ORAL_TABLET | Freq: Every day | ORAL | Status: DC
  Administered 2019-12-06: 2 mg via ORAL
  Filled 2019-12-06: qty 2

## 2019-12-06 MED ORDER — SODIUM CHLORIDE 0.9% FLUSH
3.0000 mL | Freq: Two times a day (BID) | INTRAVENOUS | Status: DC
  Administered 2019-12-06 (×2): 3 mL via INTRAVENOUS

## 2019-12-06 MED ORDER — ACETAMINOPHEN 650 MG RE SUPP: 650.0000 mg | Freq: Four times a day (QID) | RECTAL | Status: DC | PRN

## 2019-12-06 MED ORDER — ONDANSETRON HCL 4 MG PO TABS: 4.0000 mg | ORAL_TABLET | Freq: Four times a day (QID) | ORAL | Status: DC | PRN

## 2019-12-06 MED ORDER — ONDANSETRON HCL 4 MG/2ML IJ SOLN: 4.0000 mg | Freq: Four times a day (QID) | INTRAMUSCULAR | Status: DC | PRN

## 2019-12-06 MED ORDER — INSULIN GLARGINE 100 UNIT/ML ~~LOC~~ SOLN
35.0000 [IU] | Freq: Two times a day (BID) | SUBCUTANEOUS | Status: DC
  Administered 2019-12-06 – 2019-12-07 (×3): 35 [IU] via SUBCUTANEOUS
  Filled 2019-12-06 (×5): qty 0.35

## 2019-12-06 MED ORDER — PANTOPRAZOLE SODIUM 40 MG PO TBEC
40.0000 mg | DELAYED_RELEASE_TABLET | Freq: Every day | ORAL | Status: DC
  Administered 2019-12-06 – 2019-12-07 (×2): 40 mg via ORAL
  Filled 2019-12-06 (×2): qty 1

## 2019-12-06 MED ORDER — POTASSIUM CHLORIDE CRYS ER 20 MEQ PO TBCR
EXTENDED_RELEASE_TABLET | ORAL | Status: AC
  Filled 2019-12-06: qty 2

## 2019-12-06 MED ORDER — POTASSIUM CHLORIDE CRYS ER 20 MEQ PO TBCR
40.0000 meq | EXTENDED_RELEASE_TABLET | ORAL | Status: AC
  Administered 2019-12-06 (×2): 40 meq via ORAL
  Filled 2019-12-06 (×2): qty 2

## 2019-12-06 MED ORDER — METOPROLOL SUCCINATE ER 25 MG PO TB24
25.0000 mg | ORAL_TABLET | Freq: Once | ORAL | Status: AC
  Administered 2019-12-06: 25 mg via ORAL
  Filled 2019-12-06: qty 1

## 2019-12-06 MED ORDER — POTASSIUM CHLORIDE CRYS ER 20 MEQ PO TBCR: 40.0000 meq | EXTENDED_RELEASE_TABLET | ORAL | Status: DC

## 2019-12-06 MED ORDER — SODIUM CHLORIDE 0.9% FLUSH
3.0000 mL | INTRAVENOUS | Status: DC | PRN
  Administered 2019-12-07: 3 mL via INTRAVENOUS

## 2019-12-06 NOTE — Progress Notes (Signed)
EEG Completed; Results Pending  

## 2019-12-06 NOTE — ED Notes (Signed)
Dr. Shah in to see pt.

## 2019-12-06 NOTE — H&P (Signed)
TRH H&P    Patient Demographics:    Russell Morrow, is a 69 y.o. male  MRN: [id number]  DOB - Apr 25, 1951  Admit Date - 12/05/2019  Referring MD/NP/PA: Eber Hong  Outpatient Primary MD for the patient is Benita Stabile, MD  Patient coming from: Home  Chief complaint-abdominal pain   HPI:    Russell Morrow  is a 69 y.o. male, with medical history of CAD s/p stenting, diabetes mellitus type 2, hypertension, gallstones came to hospital after patient had episode of abdominal cramping yesterday. Patient states that he has been excessively sleepy and has had progressive hyperglycemia throughout the day despite not taking any food at all. Patient was drowsy when he was talking to ED provider earlier, at this time patient is alert and answering questions appropriately. As per patient he stopped to the office diabetic medications including Ozempic and Jardiance 2 months ago as he could not afford it. Currently he is on Lantus and Metformin. And his blood glucose have been elevated. In the ED he had blood glucose of 306. Potassium 3.1. He also has been having episodes of near syncope, patient is on multiple antihypertensive medications. He denies chest pain or shortness of breath. Denies dysuria denies abdominal pain at this time.    Review of systems:    In addition to the HPI above,    All other systems reviewed and are negative.    Past History of the following :    Past Medical History:  Diagnosis Date  . CAD (coronary artery disease)    a. mild nonobstructive CAD by cath in 2010 b. NST in 11/2015 showing a fixed defect with no ischemia and Coronary CT showing nonobstructive CAD along LAD and LCx c. 08/2018: s/p NSTEMI with cath showing acute total occlusion of LCx (treated with DES) and nonobstructive CAD along RCA and LAD.   . Diabetes mellitus without complication (HCC)   . Diverticulitis   . Gallstones   .  Hypertension   . MI (myocardial infarction) [facility])       Past Surgical History:  Procedure Laterality Date  . CERVICAL FUSION    . CORONARY STENT INTERVENTION N/A 08/13/2018   Procedure: CORONARY STENT INTERVENTION;  Surgeon: Tonny Bollman, MD;  Location: [facility] INVASIVE CV LAB;  Service: Cardiovascular;  Laterality: N/A;  . LEFT HEART CATH AND CORONARY ANGIOGRAPHY N/A 08/13/2018   Procedure: LEFT HEART CATH AND CORONARY ANGIOGRAPHY;  Surgeon: Tonny Bollman, MD;  Location: [facility] INVASIVE CV LAB;  Service: Cardiovascular;  Laterality: N/A;  . nasal septum repair        Social History:      Social History   Tobacco Use  . Smoking status: Never Smoker  . Smokeless tobacco: Never Used  Substance Use Topics  . Alcohol use: Not Currently    Alcohol/week: 14.0 standard drinks    Types: 14 Glasses of wine per week    Comment: wine usually daily       Family History :     Family History  Problem Relation Age of Onset  .  Cancer Mother        breast  . Hypertension Mother   . Diabetes Father   . Heart disease Father   . Hypertension Father   . Diabetes Brother   . Heart disease Brother   . Hypertension Brother   . Colon cancer Neg Hx       Home Medications:   Prior to Admission medications   Medication Sig Start Date End Date Taking? Authorizing Provider  aspirin EC 81 MG EC tablet Take 1 tablet (81 mg total) by mouth daily. 08/15/18   Allayne Butcher, PA-C  clopidogrel (PLAVIX) 75 MG tablet Take 300 mg ( 4 Tablets on day one only) Then take 75 mg (1Tablet) Daily 12/22/18   Antoine Poche, MD  dicyclomine (BENTYL) 10 MG capsule Take 10 mg by mouth 4 (four) times daily as needed. 11/18/19   [provider]  diphenhydrAMINE (BENADRYL) 25 mg capsule Take 25 mg by mouth at bedtime.    [provider]  empagliflozin (JARDIANCE) 10 MG TABS tablet Take 10 mg by mouth daily.    [provider]  esomeprazole (NEXIUM) 20 MG capsule Take 20 mg by mouth 2  (two) times daily before a meal.    [provider]  Evolocumab (REPATHA) 140 MG/ML SOSY Inject 140 mg into the skin every 14 (fourteen) days. 11/10/18   Antoine Poche, MD  FEROSUL 325 (65 Fe) MG tablet Take 325 mg by mouth daily. 11/18/19   [provider]  LANTUS SOLOSTAR 100 UNIT/ML Solostar Pen Inject 35 Units into the skin 2 (two) times daily.  10/27/19   [provider]  metFORMIN (GLUCOPHAGE) 1000 MG tablet Take 1 tablet (1,000 mg total) by mouth 2 (two) times daily with a meal. 08/16/18   Robbie Lis M, PA-C  metoprolol succinate (TOPROL-XL) 25 MG 24 hr tablet Take 3 tablets (75 mg total) by mouth daily. Patient taking differently: Take 50 mg by mouth daily.  07/31/18   Shon Hale, MD  nitroGLYCERIN (NITROSTAT) 0.4 MG SL tablet Place 1 tablet (0.4 mg total) under the tongue every 5 (five) minutes x 3 doses as needed for chest pain. 08/14/18   Simmons, Brittainy M, PA-C  Olmesartan-amLODIPine-HCTZ 40-5-25 MG TABS Take 1 tablet by mouth daily. 07/24/18   [provider]  ondansetron (ZOFRAN) 4 MG tablet Take 4 mg by mouth every 6 (six) hours as needed for nausea.  08/02/18   [provider]  OZEMPIC, 0.25 OR 0.5 MG/DOSE, 2 MG/1.5ML SOPN Inject 0.5 mg into the skin once a week. 09/02/19   [provider]  potassium chloride SA (K-DUR,KLOR-CON) 20 MEQ tablet Take 20 mEq by mouth daily.    [provider]  rOPINIRole (REQUIP) 1 MG tablet Take 2 mg by mouth at bedtime. 07/07/18   [provider]  tadalafil (CIALIS) 10 MG tablet Take 10 mg by mouth daily as needed for erectile dysfunction.  11/18/19   [provider]  temazepam (RESTORIL) 15 MG capsule Take 15 mg by mouth at bedtime. 07/07/18   [provider]     Allergies:     Allergies  Allergen Reactions  . Statins     Muscle aches, liver pain; Tried Atorvastatin, Crestor, Pravastatin, and Simvastatin  . Zetia [Ezetimibe] Other (See Comments)     Muscle Aches     Physical Exam:   Vitals  Blood pressure (!) 149/96, pulse 83, temperature 98.6 F (37 C), temperature source Oral, resp. rate 18, height 6'  1" (1.854 m), weight 108.9 kg, SpO2 93 %.  1.  General: Appears in no acute distress  2. Psychiatric: Alert, oriented x3, intact insight and judgment  3. Neurologic: Cranial nerves II through XII grossly intact, no focal deficit noted  4. HEENMT:  Atraumatic normocephalic, extraocular muscles are intact  5. Respiratory : Clear to auscultation bilaterally, no wheezing or crackles auscultated  6. Cardiovascular : S1-S2, regular, no murmur auscultated  7. Gastrointestinal:  Abdomen is soft, nontender, no organomegaly      Data Review:    CBC Recent Labs  Lab 12/05/19 2132  WBC 11.8*  HGB 14.5  HCT 48.1  PLT 336  MCV 74.9*  MCH 22.6*  MCHC 30.1  RDW 19.0*  LYMPHSABS 1.3  MONOABS 0.7  EOSABS 0.0  BASOSABS 0.0   ------------------------------------------------------------------------------------------------------------------  Results for orders placed or performed during the hospital encounter of 12/05/19 (from the past 48 hour(s))  CBG monitoring, ED     Status: Abnormal   Collection Time: 12/05/19  8:16 PM  Result Value Ref Range   Glucose-Capillary 331 (H) 70 - 99 mg/dL    Comment: Glucose reference range applies only to samples taken after fasting for at least 8 hours.  CBC with Differential/Platelet     Status: Abnormal   Collection Time: 12/05/19  9:32 PM  Result Value Ref Range   WBC 11.8 (H) 4.0 - 10.5 K/uL   RBC 6.42 (H) 4.22 - 5.81 MIL/uL   Hemoglobin 14.5 13.0 - 17.0 g/dL   HCT 16.1 39 - 52 %   MCV 74.9 (L) 80.0 - 100.0 fL   MCH 22.6 (L) 26.0 - 34.0 pg   MCHC 30.1 30.0 - 36.0 g/dL   RDW 09.6 (H) 04.5 - 40.9 %   Platelets 336 150 - 400 K/uL   nRBC 0.0 0.0 - 0.2 %   Neutrophils Relative % 83 %   Neutro Abs 9.8 (H) 1.7 - 7.7 K/uL   Lymphocytes Relative 11 %   Lymphs Abs 1.3 0.7 - 4.0  K/uL   Monocytes Relative 6 %   Monocytes Absolute 0.7 0 - 1 K/uL   Eosinophils Relative 0 %   Eosinophils Absolute 0.0 0 - 0 K/uL   Basophils Relative 0 %   Basophils Absolute 0.0 0 - 0 K/uL   Immature Granulocytes 0 %   Abs Immature Granulocytes 0.04 0.00 - 0.07 K/uL    Comment: Performed at [facility], [address] [place], [place] [address]  Comprehensive metabolic panel     Status: Abnormal   Collection Time: 12/05/19  9:32 PM  Result Value Ref Range   Sodium 136 135 - 145 mmol/L   Potassium 3.1 (L) 3.5 - 5.1 mmol/L   Chloride 90 (L) 98 - 111 mmol/L   CO2 30 22 - 32 mmol/L   Glucose, Bld 306 (H) 70 - 99 mg/dL    Comment: Glucose reference range applies only to samples taken after fasting for at least 8 hours.   BUN 15 8 - 23 mg/dL   Creatinine, Ser 4.78 0.61 - 1.24 mg/dL   Calcium 29.5 8.9 - 62.1 mg/dL   Total Protein 8.6 (H) 6.5 - 8.1 g/dL   Albumin 4.8 3.5 - 5.0 g/dL   AST 27 15 - 41 U/L   ALT 45 (H) 0 - 44 U/L   Alkaline Phosphatase 87 38 - 126 U/L   Total Bilirubin 1.0 0.3 - 1.2 mg/dL   GFR calc non Af Amer >60 >60 mL/min  GFR calc Af Amer >60 >60 mL/min   Anion gap 16 (H) 5 - 15    Comment: Performed at [facility], [address] [place], [place] [address]  Lipase, blood     Status: None   Collection Time: 12/05/19  9:32 PM  Result Value Ref Range   Lipase 23 11 - 51 U/L    Comment: Performed at [facility], [address] [place], [place] [address]  Lactic acid, plasma     Status: Abnormal   Collection Time: 12/05/19  9:33 PM  Result Value Ref Range   Lactic Acid, Venous 3.6 (HH) 0.5 - 1.9 mmol/L    Comment: CRITICAL RESULT CALLED TO, READ BACK BY AND VERIFIED WITH: EASTER,T AT 2244 ON 6.28.21 BY ISLEY,B Performed at [facility], [address] [place], [place] [address]   Urinalysis, Routine w reflex microscopic     Status: Abnormal   Collection Time: 12/05/19 11:44 PM  Result Value Ref Range   Color, Urine YELLOW YELLOW   APPearance CLEAR CLEAR    Specific Gravity, Urine 1.016 1.005 - 1.030   pH 6.0 5.0 - 8.0   Glucose, UA >=500 (A) NEGATIVE mg/dL   Hgb urine dipstick MODERATE (A) NEGATIVE   Bilirubin Urine NEGATIVE NEGATIVE   Ketones, ur 20 (A) NEGATIVE mg/dL   Protein, ur 130100 (A) NEGATIVE mg/dL   Nitrite NEGATIVE NEGATIVE   Leukocytes,Ua NEGATIVE NEGATIVE   RBC / HPF 0-5 0 - 5 RBC/hpf   WBC, UA 0-5 0 - 5 WBC/hpf   Bacteria, UA NONE SEEN NONE SEEN   Hyaline Casts, UA PRESENT     Comment: Performed at [facility], [address] [place], [place] [address]  SARS Coronavirus 2 by RT PCR (hospital order, performed in [facility] hospital lab) Nasopharyngeal Nasopharyngeal Swab     Status: None   Collection Time: 12/06/19 12:15 AM   Specimen: Nasopharyngeal Swab  Result Value Ref Range   SARS Coronavirus 2 NEGATIVE NEGATIVE    Comment: (NOTE) SARS-CoV-2 target nucleic acids are NOT DETECTED.  The SARS-CoV-2 RNA is generally detectable in upper and lower respiratory specimens during the acute phase of infection. The lowest concentration of SARS-CoV-2 viral copies this assay can detect is 250 copies / mL. A negative result does not preclude SARS-CoV-2 infection and should not be used as the sole basis for treatment or other patient management decisions.  A negative result may occur with improper specimen collection / handling, submission of specimen other than nasopharyngeal swab, presence of viral mutation(s) within the areas targeted by this assay, and inadequate number of viral copies (<250 copies / mL). A negative result must be combined with clinical observations, patient history, and epidemiological information.  Fact Sheet for Patients:   BoilerBrush.com.cyhttps://www.fda.gov/media/136312/download  Fact Sheet for Healthcare Providers: https://pope.com/https://www.fda.gov/media/136313/download  This test is not yet approved or  cleared by the [place] FDA and has been authorized for detection and/or diagnosis of SARS-CoV-2 by FDA under an  Emergency Use Authorization (EUA).  This EUA will remain in effect (meaning this test can be used) for the duration of the COVID-19 declaration under Section 564(b)(1) of the Act, 21 U.S.C. section 360bbb-3(b)(1), unless the authorization is terminated or revoked sooner.  Performed at [facility], [address] [place], [place] [address]   POC CBG, ED     Status: Abnormal   Collection Time: 12/06/19  4:34 AM  Result Value Ref Range   Glucose-Capillary 189 (H) 70 - 99 mg/dL  Comment: Glucose reference range applies only to samples taken after fasting for at least 8 hours.    Chemistries  Recent Labs  Lab 12/05/19 2132  NA 136  K 3.1*  CL 90*  CO2 30  GLUCOSE 306*  BUN 15  CREATININE 0.92  CALCIUM 10.1  AST 27  ALT 45*  ALKPHOS 87  BILITOT 1.0   ------------------------------------------------------------------------------------------------------------------  ------------------------------------------------------------------------------------------------------------------ GFR: Estimated Creatinine Clearance: 96.7 mL/min (by C-G formula based on SCr of 0.92 mg/dL). Liver Function Tests: Recent Labs  Lab 12/05/19 2132  AST 27  ALT 45*  ALKPHOS 87  BILITOT 1.0  PROT 8.6*  ALBUMIN 4.8   Recent Labs  Lab 12/05/19 2132  LIPASE 23   No results for input(s): AMMONIA in the last 168 hours. Coagulation Profile: No results for input(s): INR, PROTIME in the last 168 hours. Cardiac Enzymes: No results for input(s): CKTOTAL, CKMB, CKMBINDEX, TROPONINI in the last 168 hours. BNP (last 3 results) No results for input(s): PROBNP in the last 8760 hours. HbA1C: No results for input(s): HGBA1C in the last 72 hours. CBG: Recent Labs  Lab 12/05/19 2016 12/06/19 0434  GLUCAP 331* 189*   Lipid Profile: No results for input(s): CHOL, HDL, LDLCALC, TRIG, CHOLHDL, LDLDIRECT in the last 72 hours. Thyroid Function Tests: No results for input(s): TSH, T4TOTAL, FREET4,  T3FREE, THYROIDAB in the last 72 hours. Anemia Panel: No results for input(s): VITAMINB12, FOLATE, FERRITIN, TIBC, IRON, RETICCTPCT in the last 72 hours.  --------------------------------------------------------------------------------------------------------------- Urine analysis:    Component Value Date/Time   COLORURINE YELLOW 12/05/2019 2344   APPEARANCEUR CLEAR 12/05/2019 2344   LABSPEC 1.016 12/05/2019 2344   PHURINE 6.0 12/05/2019 2344   GLUCOSEU >=500 (A) 12/05/2019 2344   HGBUR MODERATE (A) 12/05/2019 2344   BILIRUBINUR NEGATIVE 12/05/2019 2344   KETONESUR 20 (A) 12/05/2019 2344   PROTEINUR 100 (A) 12/05/2019 2344   NITRITE NEGATIVE 12/05/2019 2344   LEUKOCYTESUR NEGATIVE 12/05/2019 2344      Imaging Results:    CT ABDOMEN PELVIS W CONTRAST  Result Date: 12/05/2019 CLINICAL DATA:  Abdominal pain, nausea EXAM: CT ABDOMEN AND PELVIS WITH CONTRAST TECHNIQUE: Multidetector CT imaging of the abdomen and pelvis was performed using the standard protocol following bolus administration of intravenous contrast. CONTRAST:  OMNIPAQUE IOHEXOL 300 MG/ML  SOLN COMPARISON:  08/13/2018 FINDINGS: Lower chest: Scarring in the lung bases.  No acute abnormality. Hepatobiliary: Small gallstones layering within the gallbladder. Diffuse fatty infiltration of the liver. No focal hepatic abnormality. Pancreas: No focal abnormality or ductal dilatation. Spleen: No focal abnormality.  Normal size. Adrenals/Urinary Tract: Exophytic intermediate density 1.4 cm lesion off the anterior midpole left kidney, low-density cysts seen within the right mid and lower pole. No hydronephrosis. Adrenal glands and urinary bladder are unremarkable. Intermediate density lesion in the mid pole posteriorly of the left kidney measures 1.9 cm. This previously measured 1.2 cm. Stomach/Bowel: Sigmoid diverticulosis. No active diverticulitis. Normal appendix. Stomach and small bowel decompressed, unremarkable.  Vascular/Lymphatic: Aortic atherosclerosis. No enlarged abdominal or pelvic lymph nodes. Reproductive: No visible focal abnormality. Other: No free fluid or free air. Small left inguinal hernia containing fat. Musculoskeletal: No acute bony abnormality. IMPRESSION: Small indeterminate lesions within both kidneys. And indeterminate lesion in the posterior midpole of the left kidney is slightly enlarged since prior study. When the patient is clinically stable and able to follow directions and hold their breath (preferably as an outpatient) further evaluation with dedicated abdominal MRI should be considered. Fatty infiltration of the liver. Cholelithiasis.  Aortic atherosclerosis. Sigmoid diverticulosis. Electronically Signed   By: Charlett Nose M.D.   On: 12/05/2019 23:30   DG Chest Port 1 View  Result Date: 12/05/2019 CLINICAL DATA:  Shortness of breath EXAM: PORTABLE CHEST 1 VIEW COMPARISON:  None. FINDINGS: Mild bronchitic changes. No focal consolidation or effusion. Borderline heart size. No pneumothorax. IMPRESSION: Mild bronchitic changes. No focal pulmonary infiltrate.  The Electronically Signed   By: Jasmine Pang M.D.   On: 12/05/2019 22:17    My personal review of EKG: Rhythm NSR, nonspecific ST changes   Assessment & Plan:    Active Problems:   Abdominal pain    1. Uncontrolled diabetes mellitus type 2-patient stopped taking 2 of his medications 2 months ago and his blood glucose have been elevated since then. Patient takes Lantus 35 units subcu twice a day along with Metformin. We will continue with Lantus, start sliding scale insulin with NovoLog. He might need adjustment of medications or addition of new medications at time of discharge. We will also consult diabetes coordinator for diabetes education. 2. Near syncope-patient said that he has been having episodes of near syncope, he is on multiple antiplatelet medications. Will hold olmesartan and HCTZ, continue with metoprolol and  amlodipine. He will need adjustment of medication before discharge. 3. Hypokalemia-potassium is 3.1, likely from HCTZ. HCTZ will be held. Will replace potassium with K. Dur 40 mg p.o. x2. Follow BMP in a.m. 4. History of CAD s/p stent placement-patient is on aspirin at home. He took Plavix for a year and is not taking Plavix anymore. He denies any chest pain at this time. 5. Abdominal pain-resolved, UA is clear. CT abdomen pelvis showed no acute abnormality. Does show age indeterminate lesion in the posterior midpole of left kidney slightly enlarged since prior study. He will need MRI abdomen as outpatient.    DVT Prophylaxis-   Lovenox   AM Labs Ordered, also please review Full Orders  Family Communication: Admission, patients condition and plan of care including tests being ordered have been discussed with the patient  who indicate understanding and agree with the plan and Code Status.  Code Status: Full code  Admission status: Observation  Time spent in minutes : 60 minutes   Demya Scruggs S Hasset Chaviano M.D

## 2019-12-06 NOTE — Procedures (Signed)
ELECTROENCEPHALOGRAM REPORT   Patient: Russell Morrow       Room #: A306 EEG No. ID: [id number] Age: 69 y.o.        Sex: male Requesting Physician: Shahmehdi Report Date:  12/06/2019        Interpreting Physician: Thana Farr  History: [name] is an 69 y.o. male with an episode of unresponsiveness  Medications:  Norvasc, ASA, Insulin, Requip, Metoprolol  Conditions of Recording:  This is a 21 channel routine scalp EEG performed with bipolar and monopolar montages arranged in accordance to the international 10/20 system of electrode placement. One channel was dedicated to EKG recording.  The patient is in the awake and drowsy states.  Description:  The waking background activity consists of a low voltage, symmetrical, fairly well organized, 8 Hz alpha activity, seen from the parieto-occipital and posterior temporal regions.  Low voltage fast activity, poorly organized, is seen anteriorly and is at times superimposed on more posterior regions.  A mixture of theta and alpha rhythms are seen from the central and temporal regions. The patient drowses with slowing to irregular, low voltage theta and beta activity.   Stage II sleep is not obtained. No epileptiform activity is noted.   Hyperventilation was not performed.  Intermittent photic stimulation was performed but failed to illicit any change in the tracing.   IMPRESSION: Normal electroencephalogram, awake, drowsy and with activation procedures. There are no focal lateralizing or epileptiform features.   Thana Farr, MD Neurology [phone] 12/06/2019, 5:20 PM

## 2019-12-06 NOTE — ED Notes (Signed)
ED TO INPATIENT HANDOFF REPORT  ED Nurse Name and Phone #:  Neldon Mc RN  S Name/Age/Gender Russell Morrow 69 y.o. male Room/Bed: APA02/APA02  Code Status   Code Status: Full Code  Home/SNF/Other Home Patient oriented to: self, place, time and situation Is this baseline? Yes   Triage Complete: Triage complete  Chief Complaint Abdominal pain [R10.9]  Triage Note Pt c/o abd pain with nausea, dry heaves that started this am, elevated blood sugar that has been ongoing, pt reports that he stopped taking some of his diabatic medications about three weeks ago, pt was sitting on the cough tonight when he went to stand up and "passed out". Pt arrived to er alert, able to answer questions,     Allergies Allergies  Allergen Reactions  . Statins     Muscle aches, liver pain; Tried Atorvastatin, Crestor, Pravastatin, and Simvastatin  . Zetia [Ezetimibe] Other (See Comments)    Muscle Aches    Level of Care/Admitting Diagnosis ED Disposition    ED Disposition Condition Comment   Admit  Hospital Area: [facility] [[id number]]  Level of Care: Med-Surg [16]  Covid Evaluation: Confirmed COVID Negative  Diagnosis: Abdominal pain [734287]  Admitting Physician: Meredeth Ide [[id number]]  Attending Physician: Lovie Chol       B Medical/Surgery History Past Medical History:  Diagnosis Date  . CAD (coronary artery disease)    a. mild nonobstructive CAD by cath in 2010 b. NST in 11/2015 showing a fixed defect with no ischemia and Coronary CT showing nonobstructive CAD along LAD and LCx c. 08/2018: s/p NSTEMI with cath showing acute total occlusion of LCx (treated with DES) and nonobstructive CAD along RCA and LAD.   . Diabetes mellitus without complication (HCC)   . Diverticulitis   . Gallstones   . Hypertension   . MI (myocardial infarction) [facility])    Past Surgical History:  Procedure Laterality Date  . CERVICAL FUSION    . CORONARY STENT INTERVENTION N/A 08/13/2018    Procedure: CORONARY STENT INTERVENTION;  Surgeon: Tonny Bollman, MD;  Location: [facility] INVASIVE CV LAB;  Service: Cardiovascular;  Laterality: N/A;  . LEFT HEART CATH AND CORONARY ANGIOGRAPHY N/A 08/13/2018   Procedure: LEFT HEART CATH AND CORONARY ANGIOGRAPHY;  Surgeon: Tonny Bollman, MD;  Location: [facility] INVASIVE CV LAB;  Service: Cardiovascular;  Laterality: N/A;  . nasal septum repair       A IV Location/Drains/Wounds Patient Lines/Drains/Airways Status    Active Line/Drains/Airways    Name Placement date Placement time Site Days   Peripheral IV 12/05/19 Right Antecubital 12/05/19  2143  Antecubital  1          Intake/Output Last 24 hours No intake or output data in the 24 hours ending 12/06/19 6811  Labs/Imaging Results for orders placed or performed during the hospital encounter of 12/05/19 (from the past 48 hour(s))  CBG monitoring, ED     Status: Abnormal   Collection Time: 12/05/19  8:16 PM  Result Value Ref Range   Glucose-Capillary 331 (H) 70 - 99 mg/dL    Comment: Glucose reference range applies only to samples taken after fasting for at least 8 hours.  CBC with Differential/Platelet     Status: Abnormal   Collection Time: 12/05/19  9:32 PM  Result Value Ref Range   WBC 11.8 (H) 4.0 - 10.5 K/uL   RBC 6.42 (H) 4.22 - 5.81 MIL/uL   Hemoglobin 14.5 13.0 - 17.0 g/dL   HCT 57.2 39 -  52 %   MCV 74.9 (L) 80.0 - 100.0 fL   MCH 22.6 (L) 26.0 - 34.0 pg   MCHC 30.1 30.0 - 36.0 g/dL   RDW 40.9 (H) 81.1 - 91.4 %   Platelets 336 150 - 400 K/uL   nRBC 0.0 0.0 - 0.2 %   Neutrophils Relative % 83 %   Neutro Abs 9.8 (H) 1.7 - 7.7 K/uL   Lymphocytes Relative 11 %   Lymphs Abs 1.3 0.7 - 4.0 K/uL   Monocytes Relative 6 %   Monocytes Absolute 0.7 0 - 1 K/uL   Eosinophils Relative 0 %   Eosinophils Absolute 0.0 0 - 0 K/uL   Basophils Relative 0 %   Basophils Absolute 0.0 0 - 0 K/uL   Immature Granulocytes 0 %   Abs Immature Granulocytes 0.04 0.00 - 0.07 K/uL    Comment: Performed  at [facility], [address] [place], [place] [address]  Comprehensive metabolic panel     Status: Abnormal   Collection Time: 12/05/19  9:32 PM  Result Value Ref Range   Sodium 136 135 - 145 mmol/L   Potassium 3.1 (L) 3.5 - 5.1 mmol/L   Chloride 90 (L) 98 - 111 mmol/L   CO2 30 22 - 32 mmol/L   Glucose, Bld 306 (H) 70 - 99 mg/dL    Comment: Glucose reference range applies only to samples taken after fasting for at least 8 hours.   BUN 15 8 - 23 mg/dL   Creatinine, Ser 6.21 0.61 - 1.24 mg/dL   Calcium 30.8 8.9 - 65.7 mg/dL   Total Protein 8.6 (H) 6.5 - 8.1 g/dL   Albumin 4.8 3.5 - 5.0 g/dL   AST 27 15 - 41 U/L   ALT 45 (H) 0 - 44 U/L   Alkaline Phosphatase 87 38 - 126 U/L   Total Bilirubin 1.0 0.3 - 1.2 mg/dL   GFR calc non Af Amer >60 >60 mL/min   GFR calc Af Amer >60 >60 mL/min   Anion gap 16 (H) 5 - 15    Comment: Performed at [facility], [address], [place], [place] [address]  Lipase, blood     Status: None   Collection Time: 12/05/19  9:32 PM  Result Value Ref Range   Lipase 23 11 - 51 U/L    Comment: Performed at [facility], [address], [place], [place] [address]  Lactic acid, plasma     Status: Abnormal   Collection Time: 12/05/19  9:33 PM  Result Value Ref Range   Lactic Acid, Venous 3.6 (HH) 0.5 - 1.9 mmol/L    Comment: CRITICAL RESULT CALLED TO, READ BACK BY AND VERIFIED WITH: EASTER,T AT 2244 ON 6.28.21 BY ISLEY,B Performed at [facility], [address], [place], [place] [address]   Urinalysis, Routine w reflex microscopic     Status: Abnormal   Collection Time: 12/05/19 11:44 PM  Result Value Ref Range   Color, Urine YELLOW YELLOW   APPearance CLEAR CLEAR   Specific Gravity, Urine 1.016 1.005 - 1.030   pH 6.0 5.0 - 8.0   Glucose, UA >=500 (A) NEGATIVE mg/dL   Hgb urine dipstick MODERATE (A) NEGATIVE   Bilirubin Urine NEGATIVE NEGATIVE   Ketones, ur 20 (A) NEGATIVE mg/dL   Protein, ur 401 (A) NEGATIVE mg/dL   Nitrite NEGATIVE NEGATIVE    Leukocytes,Ua NEGATIVE NEGATIVE   RBC / HPF 0-5 0 - 5 RBC/hpf   WBC, UA 0-5 0 - 5 WBC/hpf  Bacteria, UA NONE SEEN NONE SEEN   Hyaline Casts, UA PRESENT     Comment: Performed at [facility], [address] [place], [place] [address]  SARS Coronavirus 2 by RT PCR (hospital order, performed in [facility] hospital lab) Nasopharyngeal Nasopharyngeal Swab     Status: None   Collection Time: 12/06/19 12:15 AM   Specimen: Nasopharyngeal Swab  Result Value Ref Range   SARS Coronavirus 2 NEGATIVE NEGATIVE    Comment: (NOTE) SARS-CoV-2 target nucleic acids are NOT DETECTED.  The SARS-CoV-2 RNA is generally detectable in upper and lower respiratory specimens during the acute phase of infection. The lowest concentration of SARS-CoV-2 viral copies this assay can detect is 250 copies / mL. A negative result does not preclude SARS-CoV-2 infection and should not be used as the sole basis for treatment or other patient management decisions.  A negative result may occur with improper specimen collection / handling, submission of specimen other than nasopharyngeal swab, presence of viral mutation(s) within the areas targeted by this assay, and inadequate number of viral copies (<250 copies / mL). A negative result must be combined with clinical observations, patient history, and epidemiological information.  Fact Sheet for Patients:   BoilerBrush.com.cy  Fact Sheet for Healthcare Providers: https://pope.com/  This test is not yet approved or  cleared by the [place] FDA and has been authorized for detection and/or diagnosis of SARS-CoV-2 by FDA under an Emergency Use Authorization (EUA).  This EUA will remain in effect (meaning this test can be used) for the duration of the COVID-19 declaration under Section 564(b)(1) of the Act, 21 U.S.C. section 360bbb-3(b)(1), unless the authorization is terminated or revoked sooner.  Performed at [facility], [address], [place], [place] [address]   POC CBG, ED     Status: Abnormal   Collection Time: 12/06/19  4:34 AM  Result Value Ref Range   Glucose-Capillary 189 (H) 70 - 99 mg/dL    Comment: Glucose reference range applies only to samples taken after fasting for at least 8 hours.  Basic metabolic panel     Status: Abnormal   Collection Time: 12/06/19  5:20 AM  Result Value Ref Range   Sodium 135 135 - 145 mmol/L   Potassium 3.0 (L) 3.5 - 5.1 mmol/L   Chloride 93 (L) 98 - 111 mmol/L   CO2 30 22 - 32 mmol/L   Glucose, Bld 247 (H) 70 - 99 mg/dL    Comment: Glucose reference range applies only to samples taken after fasting for at least 8 hours.   BUN 17 8 - 23 mg/dL   Creatinine, Ser 1.74 0.61 - 1.24 mg/dL   Calcium 9.0 8.9 - 08.1 mg/dL   GFR calc non Af Amer >60 >60 mL/min   GFR calc Af Amer >60 >60 mL/min   Anion gap 12 5 - 15    Comment: Performed at [facility], [address], [place], [place] [address]  CBC     Status: Abnormal   Collection Time: 12/06/19  5:20 AM  Result Value Ref Range   WBC 11.4 (H) 4.0 - 10.5 K/uL   RBC 5.62 4.22 - 5.81 MIL/uL   Hemoglobin 12.8 (L) 13.0 - 17.0 g/dL   HCT 56.3 39 - 52 %   MCV 75.6 (L) 80.0 - 100.0 fL   MCH 22.8 (L) 26.0 - 34.0 pg   MCHC 30.1 30.0 - 36.0 g/dL   RDW 14.9 (H) 70.2 - 63.7 %   Platelets 293 150 -  400 K/uL   nRBC 0.0 0.0 - 0.2 %    Comment: Performed at [facility], [address] [place], [place] [address]  CBG monitoring, ED     Status: Abnormal   Collection Time: 12/06/19  7:51 AM  Result Value Ref Range   Glucose-Capillary 248 (H) 70 - 99 mg/dL    Comment: Glucose reference range applies only to samples taken after fasting for at least 8 hours.  Lactic acid, plasma     Status: None   Collection Time: 12/06/19  8:18 AM  Result Value Ref Range   Lactic Acid, Venous 1.5 0.5 - 1.9 mmol/L    Comment: Performed at [facility], [address], [place], [place] [address]  Procalcitonin - Baseline     Status: None    Collection Time: 12/06/19  8:18 AM  Result Value Ref Range   Procalcitonin <0.10 ng/mL    Comment:        Interpretation: PCT (Procalcitonin) <= 0.5 ng/mL: Systemic infection (sepsis) is not likely. Local bacterial infection is possible. (NOTE)       Sepsis PCT Algorithm           Lower Respiratory Tract                                      Infection PCT Algorithm    ----------------------------     ----------------------------         PCT < 0.25 ng/mL                PCT < 0.10 ng/mL          Strongly encourage             Strongly discourage   discontinuation of antibiotics    initiation of antibiotics    ----------------------------     -----------------------------       PCT 0.25 - 0.50 ng/mL            PCT 0.10 - 0.25 ng/mL               OR       >80% decrease in PCT            Discourage initiation of                                            antibiotics      Encourage discontinuation           of antibiotics    ----------------------------     -----------------------------         PCT >= 0.50 ng/mL              PCT 0.26 - 0.50 ng/mL               AND        <80% decrease in PCT             Encourage initiation of                                             antibiotics       Encourage continuation  of antibiotics    ----------------------------     -----------------------------        PCT >= 0.50 ng/mL                  PCT > 0.50 ng/mL               AND         increase in PCT                  Strongly encourage                                      initiation of antibiotics    Strongly encourage escalation           of antibiotics                                     -----------------------------                                           PCT <= 0.25 ng/mL                                                 OR                                        > 80% decrease in PCT                                      Discontinue / Do not initiate                                              antibiotics  Performed at [facility], [address] [place], [place] [address]    CT ABDOMEN PELVIS W CONTRAST  Result Date: 12/05/2019 CLINICAL DATA:  Abdominal pain, nausea EXAM: CT ABDOMEN AND PELVIS WITH CONTRAST TECHNIQUE: Multidetector CT imaging of the abdomen and pelvis was performed using the standard protocol following bolus administration of intravenous contrast. CONTRAST:  OMNIPAQUE IOHEXOL 300 MG/ML  SOLN COMPARISON:  08/13/2018 FINDINGS: Lower chest: Scarring in the lung bases.  No acute abnormality. Hepatobiliary: Small gallstones layering within the gallbladder. Diffuse fatty infiltration of the liver. No focal hepatic abnormality. Pancreas: No focal abnormality or ductal dilatation. Spleen: No focal abnormality.  Normal size. Adrenals/Urinary Tract: Exophytic intermediate density 1.4 cm lesion off the anterior midpole left kidney, low-density cysts seen within the right mid and lower pole. No hydronephrosis. Adrenal glands and urinary bladder are unremarkable. Intermediate density lesion in the mid pole posteriorly of the left kidney measures 1.9 cm. This previously measured 1.2 cm. Stomach/Bowel: Sigmoid diverticulosis. No active diverticulitis. Normal appendix. Stomach and small bowel decompressed, unremarkable. Vascular/Lymphatic: Aortic atherosclerosis. No enlarged abdominal or pelvic lymph nodes.  Reproductive: No visible focal abnormality. Other: No free fluid or free air. Small left inguinal hernia containing fat. Musculoskeletal: No acute bony abnormality. IMPRESSION: Small indeterminate lesions within both kidneys. And indeterminate lesion in the posterior midpole of the left kidney is slightly enlarged since prior study. When the patient is clinically stable and able to follow directions and hold their breath (preferably as an outpatient) further evaluation with dedicated abdominal MRI should be considered. Fatty infiltration of the liver. Cholelithiasis.  Aortic atherosclerosis. Sigmoid diverticulosis. Electronically Signed   By: Charlett NoseKevin  Dover M.D.   On: 12/05/2019 23:30   DG Chest Port 1 View  Result Date: 12/05/2019 CLINICAL DATA:  Shortness of breath EXAM: PORTABLE CHEST 1 VIEW COMPARISON:  None. FINDINGS: Mild bronchitic changes. No focal consolidation or effusion. Borderline heart size. No pneumothorax. IMPRESSION: Mild bronchitic changes. No focal pulmonary infiltrate.  The Electronically Signed   By: Jasmine PangKim  Fujinaga M.D.   On: 12/05/2019 22:17    Pending Labs Unresulted Labs (From admission, onward) Comment          Start     Ordered   12/13/19 0500  Creatinine, serum  (enoxaparin (LOVENOX)    CrCl >/= 30 ml/min)  Weekly,   R     Comments: while on enoxaparin therapy    12/06/19 0454   12/07/19 0500  CBC  Daily,   R      12/06/19 0714   12/07/19 0500  Basic metabolic panel  Daily,   R      12/06/19 0714   12/06/19 0714  Lactic acid, plasma  STAT Now then every 3 hours,   R (with STAT occurrences)      12/06/19 0714   12/06/19 0455  HIV Antibody (routine testing w rflx)  (HIV Antibody (Routine testing w reflex) panel)  Once,   STAT        12/06/19 0454   12/06/19 0444  Hemoglobin A1c  Once,   STAT       Comments: To assess prior glycemic control    12/06/19 0443          Vitals/Pain Today's Vitals   12/06/19 0817 12/06/19 0827 12/06/19 0837 12/06/19 0847  BP:      Pulse: 74 85 78 84  Resp:      Temp:      TempSrc:      SpO2: 94% 93% 95% 94%  Weight:      Height:      PainSc:        Isolation Precautions No active isolations  Medications Medications  insulin aspart (novoLOG) injection 0-9 Units (has no administration in time range)  aspirin EC tablet 81 mg (has no administration in time range)  metoprolol succinate (TOPROL-XL) 24 hr tablet 50 mg (has no administration in time range)  insulin glargine (LANTUS) injection 35 Units (has no administration in time range)  pantoprazole (PROTONIX) EC tablet 40 mg (has  no administration in time range)  rOPINIRole (REQUIP) tablet 2 mg (has no administration in time range)  enoxaparin (LOVENOX) injection 40 mg (has no administration in time range)  sodium chloride flush (NS) 0.9 % injection 3 mL (has no administration in time range)  sodium chloride flush (NS) 0.9 % injection 3 mL (has no administration in time range)  0.9 %  sodium chloride infusion (has no administration in time range)  acetaminophen (TYLENOL) tablet 650 mg (has no administration in time range)    Or  acetaminophen (TYLENOL) suppository 650 mg (has no  administration in time range)  ondansetron (ZOFRAN) tablet 4 mg (has no administration in time range)    Or  ondansetron (ZOFRAN) injection 4 mg (has no administration in time range)  amLODipine (NORVASC) tablet 10 mg (has no administration in time range)  potassium chloride SA (KLOR-CON) CR tablet 40 mEq (40 mEq Oral Given 12/06/19 0457)  0.9 %  sodium chloride infusion (has no administration in time range)  potassium chloride SA (KLOR-CON) 20 MEQ CR tablet (has no administration in time range)  rOPINIRole (REQUIP) tablet 1 mg (1 mg Oral Given 12/05/19 2208)  iohexol (OMNIPAQUE) 300 MG/ML solution 100 mL (100 mLs Intravenous Contrast Given 12/05/19 2310)  piperacillin-tazobactam (ZOSYN) IVPB 3.375 g (0 g Intravenous Stopped 12/06/19 0110)  sodium chloride 0.9 % bolus 1,000 mL (0 mLs Intravenous Stopped 12/06/19 0110)    Mobility walks Low fall risk   Focused Assessments    R Recommendations: See Admitting Provider Note  Report given to: Crystal RN 300 Additional Notes:

## 2019-12-06 NOTE — Progress Notes (Signed)
Pt has had no further issues with LOC since episode earlier this afternoon. Denies any c/o, neuro checks WNL. Working on Animator, wife at bedside.

## 2019-12-06 NOTE — Progress Notes (Addendum)
Inpatient Diabetes Program Recommendations  AACE/ADA: New Consensus Statement on Inpatient Glycemic Control   Target Ranges:  Prepandial:   less than 140 mg/dL      Peak postprandial:   less than 180 mg/dL (1-2 hours)      Critically ill patients:  140 - 180 mg/dL  Results for Russell Morrow, Russell Morrow (MRN [id number]) as of 12/06/2019 08:51  Ref. Range 12/05/2019 20:16 12/06/2019 04:34 12/06/2019 07:51  Glucose-Capillary Latest Ref Range: 70 - 99 mg/dL 810 (H) 175 (H) 102 (H)  Results for Russell Morrow, Russell Morrow (MRN [id number]) as of 12/06/2019 08:51  Ref. Range 12/05/2019 21:32 12/06/2019 05:20  Glucose Latest Ref Range: 70 - 99 mg/dL 235 (H) 361 (H)    Review of Glycemic Control  Diabetes history:  DM2 Outpatient Diabetes medications: Lantus 50 units BID, Metformin 1000 mg BID Current orders for Inpatient glycemic control: Lantus 35 units BID, Novolog 0-9 units TID with meals  Inpatient Diabetes Program Recommendations:   Insulin-Correction: Please consider adding Novolog 0-5 units QHS for bedtime correction.  Oral DM medications: May want to consider prescribing Glipizide 5 mg daily (since it is an affordable DM medication) to see if glucose improves.   HbgA1C:A1C in process.  NOTE: Spoke with patient and his wife (at bedside) about diabetes and home regimen for diabetes control. During conversation with patient, he drifted off to sleep several times during conversation but easily awoke to his name. Patient' wife notes patient has been very drowsy lately. 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   Patient reports being followed by PCP for diabetes management and currently taking Lantus 50 units BID and Metformin 1000 mg BID as an outpatient for diabetes control. Patient states he last took Lantus 50 units yesterday morning.  Patient reports that he was taking Jardiance and Ozempic as well but he stopped taking them about 3-4 weeks ago due to cost. Patient states that he is now in the "doughnut hole" under  Medicare and both the Ozempic and Jardiance were going to be over $400 to get them refilled.  Patient states he has been increasing his Lantus dose to try to get glucose better controlled and starting taking Lantus 50 units BID about 1 week ago.  Patient states he is only checking his glucose once a day (in the morning) and that is glucose has been running 200-300's mg/dl despite increase in Lantus.   Inquired about prior A1C and patient reports  last A1C was 8% about 3 weeks ago. Informed patient that current A1C is in process. Discussed checking glucose more often to see how glucose is trending throughout the day. Patient is using an insulin pen for insulin injections and mainly uses abdomen for injection sites but notes he is rotating injection sites in abdomen. Examined abdomen and no hardened areas noted. Patient states he has never experienced hypoglycemia that he is aware of.  Encouraged patient to call [phone] number on Medicare card to see if he qualifies for any medication assistance.  Discussed other DM medication options and patient will need affordable DM medication options due to being in the "doughnut hole".  Encouraged patient to follow up with PCP regarding DM management and make sure PCP is aware of cost issue with DM medications.  Patient verbalized understanding of information discussed and reports no further questions at this time related to diabetes. Ordered [facility] CM consult for outpatient assistance with cost of DM medications.   Addendum 12/06/19@16 :00-Dr. Flossie Dibble provided permission to place FreeStyle Libre2  sensor on patient. Discussed FreeStyle Libre2 with patient and reviewed how to apply and use the sensor. Placed FreeStyle Libre2 on back of patient's right arm. Encouraged patient check glucose reading with sensor when finger sticks are checked by nursing staff. Explained that it would take 60 minutes before sensor would provide glucose reading. Also explained that sensor is reading  glucose from interstitial fluid so it will likely be a little different value than finger stick since there is a delay from capillary to interstitial glucose. Explained that the sensor is good for 14 days and then will need to be replaced. Provided patient with FreeStyle Libre2 reader and an extra sensor. Asked patient to take reader device with him to follow up visits with PCP so information can be downloaded and used to make adjustments with DM medications if needed. Patient verbalized understanding of information and states he has no questions at this time.   Thanks, Orlando Penner, RN, MSN, CDE Diabetes Coordinator Inpatient Diabetes Program [phone] (Team Pager)

## 2019-12-06 NOTE — Progress Notes (Signed)
Asked by Dr. Flossie Dibble to evaluate patient for transient change in mental status.  Patient seen and examined.  He is sitting up in bed, awake, answering questions appropriately.  Lungs are clear to auscultation.  Cardiac exam is normal.  Neurologic exam is also unrevealing.  Strength is equal.  No facial asymmetry.  Cranial nerves are grossly intact.  Speech is normal.  Reported by staff that patient had an episode earlier today where he had transient alteration in mental status and arms were "flailing". He was also jerking his legs, but it is unclear if this was different than his usual movements due to restless leg syndrome. Patient has recollection of the event. Once event had subsided, patient was reportedly somnolent for approximately 10-15 mins. No tongue biting or loss of urine/bowel control. Patient reports a similar episode occurred prior to admission.  A/P:  1. Transient change in mental status. Etiology is not clear. No new medications started recently per patient. No evidence of infection. Concerns are raised for possible seizures. Will check EEG, CT head. Will consult neurology. There did not appear to be any focal findings per history and exam is currently normal. Monitor on telemetry. Lactic acid was initially elevated on arrival to ED, has since trended down.  2. Abdominal pain. Patient having abdominal pain with dry heaves prior to admission. CT abdomen unrevealing. Symptoms appear to have resolved now. He is tolerating po intake.  Time spent: 30 mins. Greater than 50% time spent with face to face interaction with patient and family, discussing plan of care, performing exam and obtaining history. Time also spent reviewing chart/labs/imaging and coordinating care with neurology.  [employer]

## 2019-12-06 NOTE — Consult Note (Signed)
[facility] Russell Morrow A. Gerilyn Pilgrim, MD     [email or web address]          Russell Morrow is an 69 y.o. male.   ASSESSMENT/PLAN: 1.  Altered level status -most likely multifactorial severe hypersomnia from multiple sleep disorders including obstructive sleep apnea syndrome and restless leg syndrome. 2.  History of obstructive sleep apnea syndrome not on treatment due to old machine.  AutoPap will be given while in hospital.  The patient will need to have a formal outpatient sleep study. 3.  Restless leg syndrome on ropinirole Lyrica will be added at a low dose for now at bedtime. 4.  Orthostatic related dizziness raising the possibility of orthostatic hypotension.  Patient does have diabetic polyneuropathy which can lead to autonomic neuropathy.  Consider orthostatics. 5.  Episode of shaking likely due to etiology as above. 6.  Marked diabetic polyneuropathy and possible diabetic autonomic neuropathy.      The patient is a 69 year old white male who is being hospitalized and treated for abdominal pain.  The patient was noted to have a couple episodes of severe drowsiness and difficulty keeping his eyes open.  The wife thinks that is due to significant sleep impairment which has been ongoing for the last several months.  It has gotten worse.  He does have a history of restless leg syndrome and is on repairing all but he still has severe difficulty sleeping through the night.  There is also history of obstructive sleep apnea syndrome but he has not used machine in a while because conditioning equipment.  The patient had couple episodes in the hospital where he became quite unresponsive and drowsy.  The patient did not lose consciousness with any of these episodes.  He had another episode of dizziness and lightheadedness with presyncope on standing associated with shaking all over but no loss of consciousness.  The patient was aware of his environment but was quite weak and had to sit down.  None  of these events were associated with palpitation, chest pain or shortness of breath.  The events he had on standing per the wife with associated with slurring of his speech and confusion.  The patient has been on gabapentin long time ago for symptoms but this caused untoward side effects.  He is on high-dose repairing all.  He has a history of iron deficiency and is being replaced.  He tells me that his iron deficiency was quite severe several years ago requiring hospitalization.  He restarted replacement about a month ago.  The patient does have significant knee pain from arthritis.  The review of systems otherwise negative.     GENERAL: This a pleasant male who is doing well at this time.  He is moderately overweight.  HEENT: Neck is supple no trauma noted.  Collar size approximately 16.  He does have a large tongue and a low-lying reddened uvula and oral airway.  ABDOMEN: soft  EXTREMITIES: No edema; there is marked arthritic changes of the knees bilaterally especially on the right.  BACK: Normal  SKIN: Normal by inspection.    MENTAL STATUS: Alert and oriented. Speech, language and cognition are generally intact. Judgment and insight normal.   CRANIAL NERVES: Pupils are equal, round and reactive to light and accomodation; extra ocular movements are full, there is no significant nystagmus; visual fields are full; upper and lower facial muscles are normal in strength and symmetric, there is no flattening of the nasolabial folds; tongue is midline; uvula is midline; shoulder elevation is  normal.  MOTOR: Normal tone, bulk and strength; no pronator drift.  COORDINATION: Left finger to nose is normal, right finger to nose is normal, No rest tremor; no intention tremor; no postural tremor; no bradykinesia.  REFLEXES: Deep tendon reflexes are symmetrical but markedly diminished throughout.    SENSATION: Normal to light touch, temperature, and pain.       Blood pressure (!) 142/87, pulse  79, temperature 98.2 F (36.8 C), temperature source Oral, resp. rate 18, height  (1.854 m), weight 108.9 kg, SpO2 97 %.  Past Medical History:  Diagnosis Date  . CAD (coronary artery disease)    a. mild nonobstructive CAD by cath in 2010 b. NST in 11/2015 showing a fixed defect with no ischemia and Coronary CT showing nonobstructive CAD along LAD and LCx c. 08/2018: s/p NSTEMI with cath showing acute total occlusion of LCx (treated with DES) and nonobstructive CAD along RCA and LAD.   . Diabetes mellitus without complication (HCC)   . Diverticulitis   . Gallstones   . Hypertension   . MI (myocardial infarction) [facility])     Past Surgical History:  Procedure Laterality Date  . CERVICAL FUSION    . CORONARY STENT INTERVENTION N/A 08/13/2018   Procedure: CORONARY STENT INTERVENTION;  Surgeon: Tonny Bollman, MD;  Location: [facility] INVASIVE CV LAB;  Service: Cardiovascular;  Laterality: N/A;  . LEFT HEART CATH AND CORONARY ANGIOGRAPHY N/A 08/13/2018   Procedure: LEFT HEART CATH AND CORONARY ANGIOGRAPHY;  Surgeon: Tonny Bollman, MD;  Location: [facility] INVASIVE CV LAB;  Service: Cardiovascular;  Laterality: N/A;  . nasal septum repair      Family History  Problem Relation Age of Onset  . Cancer Mother        breast  . Hypertension Mother   . Diabetes Father   . Heart disease Father   . Hypertension Father   . Diabetes Brother   . Heart disease Brother   . Hypertension Brother   . Colon cancer Neg Hx     Social History:  reports that he has never smoked. He has never used smokeless tobacco. He reports previous alcohol use of about 14.0 standard drinks of alcohol per week. He reports current drug use. Drug: Marijuana.  Allergies:  Allergies  Allergen Reactions  . Statins     Muscle aches, liver pain; Tried Atorvastatin, Crestor, Pravastatin, and Simvastatin  . Zetia [Ezetimibe] Other (See Comments)    Muscle Aches    Medications: Prior to Admission medications   Medication Sig  Start Date End Date Taking? Authorizing Provider  aspirin EC 81 MG EC tablet Take 1 tablet (81 mg total) by mouth daily. 08/15/18  Yes Robbie Lis M, PA-C  dicyclomine (BENTYL) 10 MG capsule Take 10 mg by mouth 4 (four) times daily as needed. 11/18/19  Yes [provider]  esomeprazole (NEXIUM) 20 MG capsule Take 20 mg by mouth 2 (two) times daily before a meal.   Yes [provider]  FEROSUL 325 (65 Fe) MG tablet Take 325 mg by mouth daily. 11/18/19  Yes [provider]  LANTUS SOLOSTAR 100 UNIT/ML Solostar Pen Inject 50 Units into the skin 2 (two) times daily.  10/27/19  Yes [provider]  metFORMIN (GLUCOPHAGE) 1000 MG tablet Take 1 tablet (1,000 mg total) by mouth 2 (two) times daily with a meal. 08/16/18  Yes Robbie Lis M, PA-C  metoprolol succinate (TOPROL-XL) 25 MG 24 hr tablet Take 3 tablets (75 mg total) by mouth daily. 07/31/18  Yes Emokpae, Courage, MD  Olmesartan-amLODIPine-HCTZ 40-5-25 MG TABS Take 1 tablet by mouth daily. 07/24/18  Yes [provider]  ondansetron (ZOFRAN) 4 MG tablet Take 4 mg by mouth every 6 (six) hours as needed for nausea.  08/02/18  Yes [provider]  potassium chloride SA (K-DUR,KLOR-CON) 20 MEQ tablet Take 20 mEq by mouth daily.   Yes [provider]  rOPINIRole (REQUIP) 1 MG tablet Take 2 mg by mouth at bedtime. 07/07/18  Yes [provider]  tadalafil (CIALIS) 10 MG tablet Take 10 mg by mouth daily as needed for erectile dysfunction.  11/18/19  Yes [provider]  temazepam (RESTORIL) 15 MG capsule Take 15 mg by mouth at bedtime. 07/07/18  Yes [provider]  clopidogrel (PLAVIX) 75 MG tablet Take 300 mg ( 4 Tablets on day one only) Then take 75 mg (1Tablet) Daily Patient not taking: Reported on 12/06/2019 12/22/18   Antoine Poche, MD  diphenhydrAMINE (BENADRYL) 25 mg capsule Take 25 mg by mouth at bedtime. Patient not taking: Reported on 12/06/2019     [provider]  empagliflozin (JARDIANCE) 10 MG TABS tablet Take 10 mg by mouth daily. Patient not taking: Reported on 12/06/2019    [provider]  Evolocumab (REPATHA) 140 MG/ML SOSY Inject 140 mg into the skin every 14 (fourteen) days. Patient not taking: Reported on 12/06/2019 11/10/18   Antoine Poche, MD  nitroGLYCERIN (NITROSTAT) 0.4 MG SL tablet Place 1 tablet (0.4 mg total) under the tongue every 5 (five) minutes x 3 doses as needed for chest pain. 08/14/18   Simmons, Brittainy M, PA-C  OZEMPIC, 0.25 OR 0.5 MG/DOSE, 2 MG/1.5ML SOPN Inject 0.5 mg into the skin once a week. Patient not taking: Reported on 12/06/2019 09/02/19   [provider]    Scheduled Meds: . amLODipine  10 mg Oral Daily  . aspirin EC  81 mg Oral Daily  . enoxaparin (LOVENOX) injection  40 mg Subcutaneous Q24H  . insulin aspart  0-9 Units Subcutaneous TID WC  . insulin glargine  35 Units Subcutaneous BID  . [START ON 12/07/2019] metoprolol succinate  75 mg Oral Daily  . pantoprazole  40 mg Oral Daily  . rOPINIRole  2 mg Oral QHS  . sodium chloride flush  3 mL Intravenous Q12H   Continuous Infusions: . sodium chloride    . sodium chloride 125 mL/hr at 12/06/19 0959   PRN Meds:.sodium chloride, acetaminophen **OR** acetaminophen, ondansetron **OR** ondansetron (ZOFRAN) IV, sodium chloride flush     Results for orders placed or performed during the hospital encounter of 12/05/19 (from the past 48 hour(s))  CBG monitoring, ED     Status: Abnormal   Collection Time: 12/05/19  8:16 PM  Result Value Ref Range   Glucose-Capillary 331 (H) 70 - 99 mg/dL    Comment: Glucose reference range applies only to samples taken after fasting for at least 8 hours.  CBC with Differential/Platelet     Status: Abnormal   Collection Time: 12/05/19  9:32 PM  Result Value Ref Range   WBC 11.8 (H) 4.0 - 10.5 K/uL   RBC 6.42 (H) 4.22 - 5.81 MIL/uL   Hemoglobin 14.5 13.0 - 17.0 g/dL   HCT 46.5 39 - 52 %    MCV 74.9 (L) 80.0 - 100.0 fL   MCH 22.6 (L) 26.0 - 34.0 pg   MCHC 30.1 30.0 - 36.0 g/dL   RDW 03.5 (H) 46.5 - 68.1 %   Platelets 336  150 - 400 K/uL   nRBC 0.0 0.0 - 0.2 %   Neutrophils Relative % 83 %   Neutro Abs 9.8 (H) 1.7 - 7.7 K/uL   Lymphocytes Relative 11 %   Lymphs Abs 1.3 0.7 - 4.0 K/uL   Monocytes Relative 6 %   Monocytes Absolute 0.7 0 - 1 K/uL   Eosinophils Relative 0 %   Eosinophils Absolute 0.0 0 - 0 K/uL   Basophils Relative 0 %   Basophils Absolute 0.0 0 - 0 K/uL   Immature Granulocytes 0 %   Abs Immature Granulocytes 0.04 0.00 - 0.07 K/uL    Comment: Performed at [facility], [address] [place], [place] [address]  Comprehensive metabolic panel     Status: Abnormal   Collection Time: 12/05/19  9:32 PM  Result Value Ref Range   Sodium 136 135 - 145 mmol/L   Potassium 3.1 (L) 3.5 - 5.1 mmol/L   Chloride 90 (L) 98 - 111 mmol/L   CO2 30 22 - 32 mmol/L   Glucose, Bld 306 (H) 70 - 99 mg/dL    Comment: Glucose reference range applies only to samples taken after fasting for at least 8 hours.   BUN 15 8 - 23 mg/dL   Creatinine, Ser 4.31 0.61 - 1.24 mg/dL   Calcium 54.0 8.9 - 08.6 mg/dL   Total Protein 8.6 (H) 6.5 - 8.1 g/dL   Albumin 4.8 3.5 - 5.0 g/dL   AST 27 15 - 41 U/L   ALT 45 (H) 0 - 44 U/L   Alkaline Phosphatase 87 38 - 126 U/L   Total Bilirubin 1.0 0.3 - 1.2 mg/dL   GFR calc non Af Amer >60 >60 mL/min   GFR calc Af Amer >60 >60 mL/min   Anion gap 16 (H) 5 - 15    Comment: Performed at [facility], [address] [place], [place] [address]  Lipase, blood     Status: None   Collection Time: 12/05/19  9:32 PM  Result Value Ref Range   Lipase 23 11 - 51 U/L    Comment: Performed at [facility], [address], [place], [place] [address]  Lactic acid, plasma     Status: Abnormal   Collection Time: 12/05/19  9:33 PM  Result Value Ref Range   Lactic Acid, Venous 3.6 (HH) 0.5 - 1.9 mmol/L    Comment: CRITICAL RESULT CALLED TO, READ BACK BY AND  VERIFIED WITH: EASTER,T AT 2244 ON 6.28.21 BY ISLEY,B Performed at [facility], [address] [place], [place] [address]   Urinalysis, Routine w reflex microscopic     Status: Abnormal   Collection Time: 12/05/19 11:44 PM  Result Value Ref Range   Color, Urine YELLOW YELLOW   APPearance CLEAR CLEAR   Specific Gravity, Urine 1.016 1.005 - 1.030   pH 6.0 5.0 - 8.0   Glucose, UA >=500 (A) NEGATIVE mg/dL   Hgb urine dipstick MODERATE (A) NEGATIVE   Bilirubin Urine NEGATIVE NEGATIVE   Ketones, ur 20 (A) NEGATIVE mg/dL   Protein, ur 809 (A) NEGATIVE mg/dL   Nitrite NEGATIVE NEGATIVE   Leukocytes,Ua NEGATIVE NEGATIVE   RBC / HPF 0-5 0 - 5 RBC/hpf   WBC, UA 0-5 0 - 5 WBC/hpf   Bacteria, UA NONE SEEN NONE SEEN   Hyaline Casts, UA PRESENT     Comment: Performed at [facility], [address], [place], [place] [address]  SARS Coronavirus 2 by RT PCR (hospital order, performed in [facility] hospital lab) Nasopharyngeal Nasopharyngeal Swab     Status: None   Collection Time: 12/06/19 12:15 AM   Specimen: Nasopharyngeal Swab  Result Value Ref Range   SARS Coronavirus 2 NEGATIVE NEGATIVE    Comment: (NOTE) SARS-CoV-2 target nucleic acids are NOT DETECTED.  The SARS-CoV-2 RNA is generally detectable in upper and lower respiratory specimens during the acute phase of infection. The lowest concentration of SARS-CoV-2 viral copies this assay can detect is 250 copies / mL. A negative result does not preclude SARS-CoV-2 infection and should not be used as the sole basis for treatment or other patient management decisions.  A negative result may occur with improper specimen collection / handling, submission of specimen other than nasopharyngeal swab, presence of viral mutation(s) within the areas targeted by this assay, and inadequate number of viral copies (<250 copies / mL). A negative result must be combined with clinical observations, patient history, and epidemiological information.  Fact  Sheet for Patients:   BoilerBrush.com.cy  Fact Sheet for Healthcare Providers: https://pope.com/  This test is not yet approved or  cleared by the [place] FDA and has been authorized for detection and/or diagnosis of SARS-CoV-2 by FDA under an Emergency Use Authorization (EUA).  This EUA will remain in effect (meaning this test can be used) for the duration of the COVID-19 declaration under Section 564(b)(1) of the Act, 21 U.S.C. section 360bbb-3(b)(1), unless the authorization is terminated or revoked sooner.  Performed at [facility], [address] [place], [place], [place] [address]   POC CBG, ED     Status: Abnormal   Collection Time: 12/06/19  4:34 AM  Result Value Ref Range   Glucose-Capillary 189 (H) 70 - 99 mg/dL    Comment: Glucose reference range applies only to samples taken after fasting for at least 8 hours.  Basic metabolic panel     Status: Abnormal   Collection Time: 12/06/19  5:20 AM  Result Value Ref Range   Sodium 135 135 - 145 mmol/L   Potassium 3.0 (L) 3.5 - 5.1 mmol/L   Chloride 93 (L) 98 - 111 mmol/L   CO2 30 22 - 32 mmol/L   Glucose, Bld 247 (H) 70 - 99 mg/dL    Comment: Glucose reference range applies only to samples taken after fasting for at least 8 hours.   BUN 17 8 - 23 mg/dL   Creatinine, Ser 7.86 0.61 - 1.24 mg/dL   Calcium 9.0 8.9 - 76.7 mg/dL   GFR calc non Af Amer >60 >60 mL/min   GFR calc Af Amer >60 >60 mL/min   Anion gap 12 5 - 15    Comment: Performed at [facility], [address], [place], [place] [address]  HIV Antibody (routine testing w rflx)     Status: None   Collection Time: 12/06/19  5:20 AM  Result Value Ref Range   HIV Screen 4th Generation wRfx Non Reactive Non Reactive    Comment: Performed at [facility], [address], [place], [place] [address]  CBC     Status: Abnormal   Collection Time: 12/06/19  5:20 AM  Result Value Ref Range   WBC 11.4 (H) 4.0 - 10.5 K/uL    RBC 5.62 4.22 - 5.81 MIL/uL   Hemoglobin 12.8 (L) 13.0 - 17.0 g/dL   HCT 36.6 39 - 52 %   MCV 75.6 (L) 80.0 - 100.0 fL   MCH 22.8 (L) 26.0 - 34.0 pg   MCHC 30.1 30.0 - 36.0  g/dL   RDW 16.1 (H) 09.6 - 04.5 %   Platelets 293 150 - 400 K/uL   nRBC 0.0 0.0 - 0.2 %    Comment: Performed at [facility], [address] [place], [place] [address]  CBG monitoring, ED     Status: Abnormal   Collection Time: 12/06/19  7:51 AM  Result Value Ref Range   Glucose-Capillary 248 (H) 70 - 99 mg/dL    Comment: Glucose reference range applies only to samples taken after fasting for at least 8 hours.  Lactic acid, plasma     Status: None   Collection Time: 12/06/19  8:18 AM  Result Value Ref Range   Lactic Acid, Venous 1.5 0.5 - 1.9 mmol/L    Comment: Performed at [facility], [address] [place], [place] [address]  Procalcitonin - Baseline     Status: None   Collection Time: 12/06/19  8:18 AM  Result Value Ref Range   Procalcitonin <0.10 ng/mL    Comment:        Interpretation: PCT (Procalcitonin) <= 0.5 ng/mL: Systemic infection (sepsis) is not likely. Local bacterial infection is possible. (NOTE)       Sepsis PCT Algorithm           Lower Respiratory Tract                                      Infection PCT Algorithm    ----------------------------     ----------------------------         PCT < 0.25 ng/mL                PCT < 0.10 ng/mL          Strongly encourage             Strongly discourage   discontinuation of antibiotics    initiation of antibiotics    ----------------------------     -----------------------------       PCT 0.25 - 0.50 ng/mL            PCT 0.10 - 0.25 ng/mL               OR       >80% decrease in PCT            Discourage initiation of                                            antibiotics      Encourage discontinuation           of antibiotics    ----------------------------     -----------------------------         PCT >= 0.50 ng/mL              PCT 0.26 -  0.50 ng/mL               AND        <80% decrease in PCT             Encourage initiation of                                             antibiotics  Encourage continuation           of antibiotics    ----------------------------     -----------------------------        PCT >= 0.50 ng/mL                  PCT > 0.50 ng/mL               AND         increase in PCT                  Strongly encourage                                      initiation of antibiotics    Strongly encourage escalation           of antibiotics                                     -----------------------------                                           PCT <= 0.25 ng/mL                                                 OR                                        > 80% decrease in PCT                                      Discontinue / Do not initiate                                             antibiotics  Performed at [facility], [address], [place], [place] [address]   Glucose, capillary     Status: Abnormal   Collection Time: 12/06/19 12:39 PM  Result Value Ref Range   Glucose-Capillary 228 (H) 70 - 99 mg/dL    Comment: Glucose reference range applies only to samples taken after fasting for at least 8 hours.  Glucose, capillary     Status: Abnormal   Collection Time: 12/06/19  1:32 PM  Result Value Ref Range   Glucose-Capillary 214 (H) 70 - 99 mg/dL    Comment: Glucose reference range applies only to samples taken after fasting for at least 8 hours.  Glucose, capillary     Status: Abnormal   Collection Time: 12/06/19  5:01 PM  Result Value Ref Range   Glucose-Capillary 215 (H) 70 - 99 mg/dL    Comment: Glucose reference range applies only to samples taken after fasting for at least 8 hours.    Studies/Results:  HEAD CT  FINDINGS: Brain: No acute intracranial abnormality. Specifically, no hemorrhage,  hydrocephalus, mass lesion, acute infarction, or significant intracranial  injury.  Vascular: No hyperdense vessel or unexpected calcification.  Skull: No acute calvarial abnormality.  Sinuses/Orbits: Visualized paranasal sinuses and mastoids clear. Orbital soft tissues unremarkable.  Other: None  IMPRESSION: Normal study for patient's age.   The brain MRI is reviewed in person and shows no acute findings.  The scan is normal.  EEG normal     Russell Morrow, M.D.  Diplomate, Biomedical engineerAmerican Board of Psychiatry and Neurology ( Neurology). 12/06/2019, 6:13 PM

## 2019-12-06 NOTE — Progress Notes (Signed)
1315: in to get vital signs, pt walking in room, states feels a little lightheaded. Had pt sit on side of bed, pt very drowsy acting, can't hold arm still and straight for B/P check, head bobbing down. Had pt to lie back on bed. Pt immediately snoring respirations. Awakens to name called, follows commands but immediately back to sleep without stimuli. Neurochecks OK, follows commands, oriented x4, no drift of extremities. B/P 152/117, MD Shahmehdi notified of current b/p and pt condition. 1325: Pt deep snoring respirations then suddenly both arms flailing in air, legs jerking, pt with mumbling/slurred speech, pupils pinpoint. Pt unable to follow commands, blank gaze when name called and sternal rub. Episode lasted < 3 minutes. Skin warm and dry. B/P 163/107, HR 72, resp 18, SaO2 95%. Accucheck reveals bloodsugar of 214mg /dl. : Pt awake, states, "That's the same way I felt yesterday at home. I don't know what happened. I know something's wrong but I can't do anything about it. I was trying to tell you to check my blood pressure but the words wouldn't come out."  1335: B/P rechecked, 160/87. Pupils 2 - 3, equal, round and sluggish. Pt still very drowsy but awakens to name called and able to follow all commands without difficulty. MD Shahmehdi updated on current VS and condition.

## 2019-12-06 NOTE — Progress Notes (Signed)
PROGRESS NOTE    Patient: Russell Morrow                            PCP: Benita Stabile, MD                    DOB: 1950/07/07            DOA: 12/05/2019 OLM:[id number]             DOS: 12/06/2019, 9:55 AM   LOS: 0 days   Date of Service: The patient was seen and examined on 12/06/2019  Subjective:   The patient was seen and examined this AM. Stable  The patient seems excessively tired, sleepy but easily arousable.  Wife present at bedside. Reporting the reason for hypoglycemia noncompliant is due to cost of the medication.` Otherwise no issues overnight .  Brief Narrative:  Per HPI: Russell Morrow  is a 69 y.o. male, with medical history of CAD s/p stenting, diabetes mellitus type 2, hypertension, gallstones came to hospital after patient had episode of abdominal cramping yesterday. Patient states that he has been excessively sleepy and has had progressive hyperglycemia throughout the day despite not taking any food at all.As per patient he stopped to the office diabetic medications including Ozempic and Jardiance 2 months ago as he could not afford it. Currently he is on Lantus and Metformin   Assessment & Plan:   Active Problems:   Abdominal pain  Somnolence/drowsiness/delirium -We will continue to monitor closely, likely multifactorial, including ovaries and -Per wife due to symptom he has had significant insomnia for past few days -Currently arousable following command   Uncontrolled diabetes mellitus type 2-patient  -stopped taking 2 of his medications 2 months ago and his blood glucose have been elevated since then. Patient takes Lantus 35 units subcu twice a day along with Metformin.  -We will continue Lantus, with SSI, --adjust dosage for adequate glycemic control -Anticipating continuing Metformin - consult diabetes coordinator for diabetes education.  Near syncope -patient said that he has been having episodes of near syncope, -Monitoring closely -Adjusted medication  including diabetic medication, antiplatelet medication, hypertensive medication -Holding olmesartan and HCTZ, continue with metoprolol and amlodipine.  He will need adjustment of medication before discharge.  Hypokalemia -On admission potassium is 3.1, likely from HCTZ. - HCTZ will be held.  -Monitoring potassium level, 40 mEq x 2 has been given this on admission, -Monitoring and following BMP  History of CAD s/p stent placement -patient is on aspirin at home.  -He took Plavix for a year and is not taking Plavix anymore.  - denies any chest pain or shortness of breath at this time.    Abdominal pain, with nausea vomiting -Resolved -Gentle IV fluid hydration -Encouraging oral intake -As needed antiemetics -UA is clear. CT abdomen pelvis showed no acute abnormality. Does show age indeterminate lesion in the posterior midpole of left kidney slightly enlarged since prior study. He will need MRI abdomen as outpatient.     Nutritional status:         Cultures; None    Antimicrobials: None     Consultants: None   ------------------------------------------------------------------------------------------------------------------------------------------------  DVT prophylaxis:  SCD/Compression stockings and Lovenox SQ Code Status:   Code Status: Full Code Family Communication: Wife was present at bedside The above findings and plan of care has been discussed with patient (and family )  in detail,  they expressed understanding and agreement of above. -  Advance care planning has been discussed.   Admission status:    Status is: Observation  The patient remains OBS appropriate and will d/c before 2 midnights.  Dispo: The patient is from: Home              Anticipated d/c is to: Home              Anticipated d/c date is:1- 2 days              Patient currently is not medically stable to d/c.        Procedures:   No admission procedures for hospital  encounter.     Antimicrobials:  Anti-infectives (From admission, onward)   Start     Dose/Rate Route Frequency Ordered Stop   12/05/19 2345  piperacillin-tazobactam (ZOSYN) IVPB 3.375 g        3.375 g 100 mL/hr over 30 Minutes Intravenous  Once 12/05/19 2336 12/06/19 0110       Medication:  . amLODipine  10 mg Oral Daily  . aspirin EC  81 mg Oral Daily  . enoxaparin (LOVENOX) injection  40 mg Subcutaneous Q24H  . insulin aspart  0-9 Units Subcutaneous TID WC  . insulin glargine  35 Units Subcutaneous BID  . metoprolol succinate  50 mg Oral Daily  . pantoprazole  40 mg Oral Daily  . potassium chloride SA      . potassium chloride  40 mEq Oral Q4H  . rOPINIRole  2 mg Oral QHS  . sodium chloride flush  3 mL Intravenous Q12H    sodium chloride, acetaminophen **OR** acetaminophen, ondansetron **OR** ondansetron (ZOFRAN) IV, sodium chloride flush   Objective:   Vitals:   12/06/19 0827 12/06/19 0837 12/06/19 0847 12/06/19 0915  BP:    (!) 160/88  Pulse: 85 78 84 79  Resp:    18  Temp:    99 F (37.2 C)  TempSrc:    Oral  SpO2: 93% 95% 94% 98%  Weight:      Height:       No intake or output data in the 24 hours ending 12/06/19 0955 Filed Weights   12/05/19 2018  Weight: 108.9 kg     Examination:   Physical Exam  Constitution: Somnolent, easily arousable,  alert, cooperative, no distress,  Appears calm and comfortable  Psychiatric: Normal and stable mood and affect, cognition intact,   HEENT: Normocephalic, PERRL, otherwise with in Normal limits  Chest:Chest symmetric Cardio vascular:  S1/S2, RRR, No murmure, No Rubs or Gallops  pulmonary: Clear to auscultation bilaterally, respirations unlabored, negative wheezes / crackles Abdomen: Soft, non-tender, non-distended, bowel sounds,no masses, no organomegaly Muscular skeletal: Limited exam - in bed, able to move all 4 extremities, Normal strength,  Neuro: CNII-XII intact. , normal motor and sensation, reflexes  intact  Extremities: No pitting edema lower extremities, +2 pulses  Skin: Dry, warm to touch, negative for any Rashes, No open wounds Wounds: per nursing documentation    ------------------------------------------------------------------------------------------------------------------------------------------    LABs:  CBC Latest Ref Rng & Units 12/06/2019 12/05/2019 08/14/2018  WBC 4.0 - 10.5 K/uL 11.4(H) 11.8(H) 10.9(H)  Hemoglobin 13.0 - 17.0 g/dL 12.8(L) 14.5 14.5  Hematocrit 39 - 52 % 42.5 48.1 41.8  Platelets 150 - 400 K/uL 293 336 263   CMP Latest Ref Rng & Units 12/06/2019 12/05/2019 08/14/2018  Glucose 70 - 99 mg/dL 301(S) 010(X) 95  BUN 8 - 23 mg/dL 17 15 14   Creatinine 0.61 - 1.24 mg/dL 3.23  0.82  Sodium 135 - 145 mmol/L 135 136 136  Potassium 3.5 - 5.1 mmol/L 3.0(L) 3.1(L) 3.0(L)  Chloride 98 - 111 mmol/L 93(L) 90(L) 100  CO2 22 - 32 mmol/L 30 30 26   Calcium 8.9 - 10.3 mg/dL 9.0 16.0)  Total Protein 6.5 - 8.1 g/dL - 8.6(H) -  Total Bilirubin 0.3 - 1.2 mg/dL - 1.0 -  Alkaline Phos 38 - 126 U/L - 87 -  AST 15 - 41 U/L - 27 -  ALT 0 - 44 U/L - 45(H) -       Micro Results Recent Results (from the past 240 hour(s))  SARS Coronavirus 2 by RT PCR (hospital order, performed in [facility] hospital lab) Nasopharyngeal Nasopharyngeal Swab     Status: None   Collection Time: 12/06/19 12:15 AM   Specimen: Nasopharyngeal Swab  Result Value Ref Range Status   SARS Coronavirus 2 NEGATIVE NEGATIVE Final    Comment: (NOTE) SARS-CoV-2 target nucleic acids are NOT DETECTED.  The SARS-CoV-2 RNA is generally detectable in upper and lower respiratory specimens during the acute phase of infection. The lowest concPerformed at [facility], [address], [place], [place]     Radiology Reports CT ABDOMEN PELVIS W CONTRAST  Result Date: 12/05/2019 CLINICAL DATA:  Abdominal pain, nausea EXAM: CT ABDOMEN AND PELVIS WITH CONTRAST TECHNIQUE: Multidetector CT imaging of  the abdomen and pelvis was performed using the standard protocol following bolus administration of intravenous contrast. CONTRAST:  12/07/2019 OMNIPAQUE IOHEXOL 300 MG/ML  SOLN COMPARISON:  08/13/2018 FINDINGS: Lower chest: Scarring in the lung bases.  No acute abnormality. Hepatobiliary: Small gallstones layering within the gallbladder. Diffuse fatty infiltration of the liver. No focal hepatic abnormality. Pancreas: No focal abnormality or ductal dilatation. Spleen: No focal abnormality.  Normal size. Adrenals/Urinary Tract: Exophytic intermediate density 1.4 cm lesion off the anterior midpole left kidney, low-density cysts seen within the right mid and lower pole. No hydronephrosis. Adrenal glands and urinary bladder are unremarkable. Intermediate density lesion in the mid pole posteriorly of the left kidney measures 1.9 cm. This previously measured 1.2 cm. Stomach/Bowel: Sigmoid diverticulosis. No active diverticulitis. Normal appendix. Stomach and small bowel decompressed, unremarkable. Vascular/Lymphatic: Aortic atherosclerosis. No enlarged abdominal or pelvic lymph nodes. Reproductive: No visible focal abnormality. Other: No free fluid or free air. Small left inguinal hernia containing fat. Musculoskeletal: No acute bony abnormality. IMPRESSION: Small indeterminate lesions within both kidneys. And indeterminate lesion in the posterior midpole of the left kidney is slightly enlarged since prior study. When the patient is clinically stable and able to follow directions and hold their breath (preferably as an outpatient) further evaluation with dedicated abdominal MRI should be considered. Fatty infiltration of the liver. Cholelithiasis. Aortic atherosclerosis. Sigmoid diverticulosis. Electronically Signed   By: 10/13/2018 M.D.   On: 12/05/2019 23:30   DG Chest Port 1 View  Result Date: 12/05/2019 CLINICAL DATA:  Shortness of breath EXAM: PORTABLE CHEST 1 VIEW COMPARISON:  None. FINDINGS: Mild bronchitic  changes. No focal consolidation or effusion. Borderline heart size. No pneumothorax. IMPRESSION: Mild bronchitic changes. No focal pulmonary infiltrate.  The Electronically Signed   By: 12/07/2019 M.D.   On: 12/05/2019 22:17    SIGNED: 12/07/2019, MD, FACP, FHM. [facility],  Pager (please use [email or web address] to page/text)  If 7PM-7AM, please contact night-coverage [email or web address] [facility] 12/06/2019, 9:55 AM

## 2019-12-06 NOTE — Care Management Obs Status (Signed)
MEDICARE OBSERVATION STATUS NOTIFICATION   Patient Details  Name: Russell Morrow MRN: [id number] Date of Birth: 1951/04/09   Medicare Observation Status Notification Given:  Yes    Corey Harold 12/06/2019, 1:48 PM

## 2019-12-07 ENCOUNTER — Inpatient Hospital Stay (HOSPITAL_COMMUNITY): Payer: Medicare Other

## 2019-12-07 DIAGNOSIS — E782 Mixed hyperlipidemia: Secondary | ICD-10-CM | POA: Diagnosis not present

## 2019-12-07 DIAGNOSIS — I10 Essential (primary) hypertension: Secondary | ICD-10-CM | POA: Diagnosis not present

## 2019-12-07 DIAGNOSIS — Z0001 Encounter for general adult medical examination with abnormal findings: Secondary | ICD-10-CM | POA: Diagnosis not present

## 2019-12-07 DIAGNOSIS — I161 Hypertensive emergency: Secondary | ICD-10-CM

## 2019-12-07 DIAGNOSIS — E1169 Type 2 diabetes mellitus with other specified complication: Secondary | ICD-10-CM | POA: Diagnosis not present

## 2019-12-07 DIAGNOSIS — I2510 Atherosclerotic heart disease of native coronary artery without angina pectoris: Secondary | ICD-10-CM | POA: Diagnosis not present

## 2019-12-07 LAB — CBC
HCT: 41.2 % (ref 39.0–52.0)
Hemoglobin: 12.4 g/dL — ABNORMAL LOW (ref 13.0–17.0)
MCH: 23 pg — ABNORMAL LOW (ref 26.0–34.0)
MCHC: 30.1 g/dL (ref 30.0–36.0)
MCV: 76.4 fL — ABNORMAL LOW (ref 80.0–100.0)
Platelets: 268 10*3/uL (ref 150–400)
RBC: 5.39 MIL/uL (ref 4.22–5.81)
RDW: 19.3 % — ABNORMAL HIGH (ref 11.5–15.5)
WBC: 8.6 10*3/uL (ref 4.0–10.5)
nRBC: 0 % (ref 0.0–0.2)

## 2019-12-07 LAB — GLUCOSE, CAPILLARY
Glucose-Capillary: 145 mg/dL — ABNORMAL HIGH (ref 70–99)
Glucose-Capillary: 222 mg/dL — ABNORMAL HIGH (ref 70–99)
Glucose-Capillary: 228 mg/dL — ABNORMAL HIGH (ref 70–99)

## 2019-12-07 LAB — BASIC METABOLIC PANEL
Anion gap: 11 (ref 5–15)
BUN: 22 mg/dL (ref 8–23)
CO2: 28 mmol/L (ref 22–32)
Calcium: 8.3 mg/dL — ABNORMAL LOW (ref 8.9–10.3)
Chloride: 100 mmol/L (ref 98–111)
Creatinine, Ser: 0.83 mg/dL (ref 0.61–1.24)
GFR calc Af Amer: >60 mL/min (ref >60)
GFR calc non Af Amer: >60 mL/min (ref >60)
Glucose, Bld: 143 mg/dL — ABNORMAL HIGH (ref 70–99)
Potassium: 2.8 mmol/L — ABNORMAL LOW (ref 3.5–5.1)
Sodium: 139 mmol/L (ref 135–145)

## 2019-12-07 LAB — ECHOCARDIOGRAM COMPLETE
Height: 73 in
Weight: 3840 oz

## 2019-12-07 LAB — MAGNESIUM: Magnesium: 2 mg/dL (ref 1.7–2.4)

## 2019-12-07 MED ORDER — LANTUS SOLOSTAR 100 UNIT/ML ~~LOC~~ SOPN: 35.0000 [IU] | PEN_INJECTOR | Freq: Two times a day (BID) | SUBCUTANEOUS | 11 refills | Status: AC

## 2019-12-07 MED ORDER — PREGABALIN 75 MG PO CAPS: 75.0000 mg | ORAL_CAPSULE | Freq: Every day | ORAL | 0 refills | Status: DC

## 2019-12-07 MED ORDER — POTASSIUM CHLORIDE CRYS ER 20 MEQ PO TBCR
40.0000 meq | EXTENDED_RELEASE_TABLET | ORAL | Status: AC
  Administered 2019-12-07 (×3): 40 meq via ORAL
  Filled 2019-12-07 (×4): qty 2

## 2019-12-07 NOTE — Progress Notes (Signed)
*  PRELIMINARY RESULTS* Echocardiogram 2D Echocardiogram has been performed.  Stacey Drain 12/07/2019, 10:27 AM

## 2019-12-07 NOTE — Progress Notes (Signed)
PT Cancellation Note  Patient Details Name: EMMANUELLE COXE MRN: [id number] DOB: 23-Jan-1951   Cancelled Treatment:    Reason Eval/Treat Not Completed: PT screened, no needs identified, will sign off   3:23 PM, 12/07/19 Ocie Bob, MPT Physical Therapist with [facility] 336 [phone] office [phone] mobile phone

## 2019-12-07 NOTE — Discharge Summary (Signed)
Physician Discharge Summary  Russell Morrow WUJ:[id number] DOB: [birth year] DOA: 12/05/2019  PCP: Benita Stabile, MD  Admit date: 12/05/2019 Discharge date: 12/07/2019  Time spent: 40 minutes  Recommendations for Outpatient Follow-up:  1. Follow outpatient CBC/CMP (attention to potassium) 2. Needs outpatient sleep study  3. Lantus increased to 35 units twice daily (previously taking 50 units daily) - follow BG with this and metformin - please discuss metformin xr with pt as he notes he maybe developing some GI symptoms 4. Follow RLS outpatient - started on lyrica inpatient 5. Follow abnormal echo results outpatient - aortic root dilatation - possible bicuspid AV - follow with cardiology outpatient 6. Indeterminate lesions within both kidney - needs abdominal MRI outpatient (he tells me he's had MRI before and these lesions were benign - recommended he discuss this outpatient with his PCP to determine whether additional w/u needed) 7. Repeat UA outpatient to follow proteinuria   Discharge Diagnoses:  Active Problems:   Hypertension   Abdominal pain, epigastric   Hypokalemia   Type 2 diabetes mellitus (HCC)   CAD (coronary artery disease)   Abdominal pain   Discharge Condition: stable  Diet recommendation: heart healthy/diabetic  Filed Weights   12/05/19 2018  Weight: 108.9 kg    History of present illness:  Per HPI: Russell Morrow a68 y.o.male,with medical history of CAD s/p stenting, diabetes mellitus type 2, hypertension, gallstones came to hospital after patient had episode of abdominal cramping yesterday. Patient states that he has been excessively sleepy and has had progressive hyperglycemia throughout the day despite not taking any food at all.  As per patient he stopped Ozempic and Jardiance 2 months ago as he could not afford it. Currently he is on Lantus and Metformin only.  He's also been reportedly having episodes of presyncope?  He was admitted with abdominal pain,  lethargy, and hyperglycemia.  He had AMS on 6/29 and was seen by neurology who felt this was 2/2 hypersomnia from OSA and RLS.  He had EEG and head CT which were unrevealing.  He's improved on the day of discharge, 6/30 and is back to his normal.  Discharged with plans for outpatient follow up.    See below for additional details  Hospital Course:  Acute Metabolic Encephalopathy  Somnolence/drowsiness  delirium - seen by Dr. Gerilyn Pilgrim who suspected multifactorial severe hypersomnia from OSA and RLS - he'll need outpatient sleep study  - started on lyrica in addition to ropinirole - negative orthostatics - head CT without acute abnormality, EEG without focal lateralizing or epileptiform features  - back to his baseline today on 6/20  Uncontrolled diabetes mellitus type 2-patient  -stopped taking 2 of his medications 2 months ago and his blood glucose have been elevated since then.  - per pt report, he's taking 50 units lantus at home -> he's on 35 units twice daily here - discussed need to continue this at home and resume metformin - follow outpatient   Near syncope -patient said that he has been having episodes of near syncope -suspect related to problem 1 -echo with EF 60-65%, mild apical hypokinesis, regional wall motion abnormalities, diastolic dysfunction (echo from 2020 had EF 45-50%)  - negative orthostatics today on day of discharge - hypertensive, resume home BP meds - follow outpatient  -Monitoring closely  Abnormal Echo Follow outpatient (aortic root dilatation, possible bicuspid valve, wall motion abnormalities  Hypokalemia -replace and follow, continue outpatient supplementation (120 meq given on day of discharge) - discussed need for outpatient follow  up of potassium  History of CAD s/p stent placement -patient is on aspirin at home.  - denies any chest pain or shortness of breath at this time.    Abdominal pain, with nausea vomiting -Resolved -UA is notable  for protein. CT abdomen pelvis showed no acute abnormality. Does show age indeterminate lesions in both kidneys and indeterminate lesion in posterior midpole of the L kidney slightly enlarged since prior.  Needs MRI abdomen as outpatient.    Procedures: Echo IMPRESSIONS    1. There is mild apical hypokinesis. . Left ventricular ejection  fraction, by estimation, is 60 to 65%. The left ventricle has normal  function. The left ventricle demonstrates regional wall motion  abnormalities (see scoring diagram/findings for  description). There is moderate left ventricular hypertrophy. Left  ventricular diastolic parameters are consistent with Grade I diastolic  dysfunction (impaired relaxation).  2. Right ventricular systolic function is normal. The right ventricular  size is normal.  3. Left atrial size was mildly dilated.  4. The mitral valve is normal in structure. Trivial mitral valve  regurgitation. No evidence of mitral stenosis.  5. Indeterminant number of aortic valve cusps, possibly bicuspid. . The  aortic valve has an indeterminant number of cusps. Aortic valve  regurgitation is not visualized. No aortic stenosis is present.  6. Aortic dilatation noted. There is mild dilatation of the aortic root  measuring 41 mm.  7. The inferior vena cava is dilated in size with >50% respiratory  variability, suggesting right atrial pressure of 8 mmHg.   Consultations:  neurology  Discharge Exam: Vitals:   12/06/19 2201 12/07/19 0536  BP:  (!) 172/105  Pulse: 76 70  Resp:  16  Temp:  98.3 F (36.8 C)  SpO2: 96% 97%   Feeling btter, eager to go home Wife at bedside  General: No acute distress. Cardiovascular: Heart sounds show Ileanna Gemmill regular rate, and rhythm. Lungs: Clear to auscultation bilaterally Abdomen: Soft, nontender, nondistended  Neurological: Alert and oriented 3. Moves all extremities 4h. Cranial nerves II through XII grossly intact. Skin: Warm and dry. No rashes  or lesions. Extremities: No clubbing or cyanosis. No edema.   Discharge Instructions   Discharge Instructions    Call MD for:  difficulty breathing, headache or visual disturbances   Complete by: As directed    Call MD for:  extreme fatigue   Complete by: As directed    Call MD for:  hives   Complete by: As directed    Call MD for:  persistant dizziness or light-headedness   Complete by: As directed    Call MD for:  persistant nausea and vomiting   Complete by: As directed    Call MD for:  redness, tenderness, or signs of infection (pain, swelling, redness, odor or green/yellow discharge around incision site)   Complete by: As directed    Call MD for:  severe uncontrolled pain   Complete by: As directed    Call MD for:  temperature >100.4   Complete by: As directed    Diet - low sodium heart healthy   Complete by: As directed    Discharge instructions   Complete by: As directed    You were seen for altered mental status and abdominal discomfort.  Your CT of your abdomen was notable for kidney lesions.  Please follow these imaging findings up with your outpatient provider and discuss the possibility of an abdominal MRI with your PCP.  Your altered mental status is thought to be  related to your obstructive sleep apnea and restless leg.  You need an outpatient sleep study and CPAP as an outpatient.  You've been started on lyrica in addition to your ropinirole at bedtime.  Your lantus has been increased to 35 units twice daily.  Please continue this with the metformin and watch your blood sugars closely.  Discuss extended release metformin with your PCP given your GI symptoms with metformin.  Your potassium is low.  Please continue your potassium as an outpatient.  Please follow up repeat labs with your PCP within Dejanae Helser week.  Follow up your aortic root dilatation and your abnormal echo with cardiology outpatient.  Return for new, recurrent, or worsening symptoms.  Please ask your  PCP to request records from this hospitalization so they know what was done and what the next steps will be.   Increase activity slowly   Complete by: As directed      Allergies as of 12/07/2019      Reactions   Statins    Muscle aches, liver pain; Tried Atorvastatin, Crestor, Pravastatin, and Simvastatin   Zetia [ezetimibe] Other (See Comments)   Muscle Aches      Medication List    STOP taking these medications   clopidogrel 75 MG tablet Commonly known as: PLAVIX   Jardiance 10 MG Tabs tablet Generic drug: empagliflozin   Ozempic (0.25 or 0.5 MG/DOSE) 2 MG/1.5ML Sopn Generic drug: Semaglutide(0.25 or 0.5MG /DOS)     TAKE these medications   aspirin 81 MG EC tablet Take 1 tablet (81 mg total) by mouth daily.   dicyclomine 10 MG capsule Commonly known as: BENTYL Take 10 mg by mouth 4 (four) times daily as needed.   diphenhydrAMINE 25 mg capsule Commonly known as: BENADRYL Take 25 mg by mouth at bedtime.   esomeprazole 20 MG capsule Commonly known as: NEXIUM Take 20 mg by mouth 2 (two) times daily before Len Kluver meal.   Evolocumab 140 MG/ML Sosy Commonly known as: Repatha Inject 140 mg into the skin every 14 (fourteen) days.   FeroSul 325 (65 FE) MG tablet Generic drug: ferrous sulfate Take 325 mg by mouth daily.   Lantus SoloStar 100 UNIT/ML Solostar Pen Generic drug: insulin glargine Inject 35 Units into the skin 2 (two) times daily. What changed: how much to take   metFORMIN 1000 MG tablet Commonly known as: GLUCOPHAGE Take 1 tablet (1,000 mg total) by mouth 2 (two) times daily with Demareon Coldwell meal.   metoprolol succinate 25 MG 24 hr tablet Commonly known as: TOPROL-XL Take 3 tablets (75 mg total) by mouth daily.   nitroGLYCERIN 0.4 MG SL tablet Commonly known as: NITROSTAT Place 1 tablet (0.4 mg total) under the tongue every 5 (five) minutes x 3 doses as needed for chest pain.   Olmesartan-amLODIPine-HCTZ 40-5-25 MG Tabs Take 1 tablet by mouth daily.    ondansetron 4 MG tablet Commonly known as: ZOFRAN Take 4 mg by mouth every 6 (six) hours as needed for nausea.   potassium chloride SA 20 MEQ tablet Commonly known as: KLOR-CON Take 20 mEq by mouth daily.   pregabalin 75 MG capsule Commonly known as: LYRICA Take 1 capsule (75 mg total) by mouth at bedtime.   rOPINIRole 1 MG tablet Commonly known as: REQUIP Take 2 mg by mouth at bedtime.   tadalafil 10 MG tablet Commonly known as: CIALIS Take 10 mg by mouth daily as needed for erectile dysfunction.   temazepam 15 MG capsule Commonly known as: RESTORIL Take 15 mg by  mouth at bedtime.      Allergies  Allergen Reactions  . Statins     Muscle aches, liver pain; Tried Atorvastatin, Crestor, Pravastatin, and Simvastatin  . Zetia [Ezetimibe] Other (See Comments)    Muscle Aches    Follow-up Information    Benita Stabile, MD Follow up.   Specialty: Internal Medicine Contact information: [address] [place] [address] [phone]        Antoine Poche, MD .   Specialty: Cardiology Contact information: [address] [place] [address] [phone]        Beryle Beams, MD Follow up.   Specialty: Neurology Why: call for sleep study Contact information: [address] [place] [facility] [address] [phone]                The results of significant diagnostics from this hospitalization (including imaging, microbiology, ancillary and laboratory) are listed below for reference.    Significant Diagnostic Studies: EEG  Result Date: 12/06/2019 Thana Farr, MD     12/06/2019  5:23 PM ELECTROENCEPHALOGRAM REPORT Patient: MYLO CHOI       Room #: A306 EEG No. ID: [id number] Age: 69 y.o.        Sex: male Requesting Physician: Shahmehdi Report Date:  12/06/2019       Interpreting Physician: Thana Farr History: ALONTAE CHALOUX is an 69 y.o. male with an episode of unresponsiveness Medications: Norvasc, ASA, Insulin, Requip, Metoprolol  Conditions of Recording:  This is Yamilette Garretson 21 channel routine scalp EEG performed with bipolar and monopolar montages arranged in accordance to the international 10/20 system of electrode placement. One channel was dedicated to EKG recording. The patient is in the awake and drowsy states. Description:  The waking background activity consists of Shamarr Faucett low voltage, symmetrical, fairly well organized, 8 Hz alpha activity, seen from the parieto-occipital and posterior temporal regions.  Low voltage fast activity, poorly organized, is seen anteriorly and is at times superimposed on more posterior regions.  Zyiere Rosemond mixture of theta and alpha rhythms are seen from the central and temporal regions. The patient drowses with slowing to irregular, low voltage theta and beta activity.  Stage II sleep is not obtained. No epileptiform activity is noted.  Hyperventilation was not performed.  Intermittent photic stimulation was performed but failed to illicit any change in the tracing. IMPRESSION: Normal electroencephalogram, awake, drowsy and with activation procedures. There are no focal lateralizing or epileptiform features. Thana Farr, MD Neurology [phone] 12/06/2019, 5:20 PM   CT HEAD WO CONTRAST  Result Date: 12/06/2019 CLINICAL DATA:  Seizure EXAM: CT HEAD WITHOUT CONTRAST TECHNIQUE: Contiguous axial images were obtained from the base of the skull through the vertex without intravenous contrast. COMPARISON:  None. FINDINGS: Brain: No acute intracranial abnormality. Specifically, no hemorrhage, hydrocephalus, mass lesion, acute infarction, or significant intracranial injury. Vascular: No hyperdense vessel or unexpected calcification. Skull: No acute calvarial abnormality. Sinuses/Orbits: Visualized paranasal sinuses and mastoids clear. Orbital soft tissues unremarkable. Other: None IMPRESSION: Normal study for patient's age. Electronically Signed   By: Charlett Nose M.D.   On: 12/06/2019 15:59   CT ABDOMEN PELVIS W  CONTRAST  Result Date: 12/05/2019 CLINICAL DATA:  Abdominal pain, nausea EXAM: CT ABDOMEN AND PELVIS WITH CONTRAST TECHNIQUE: Multidetector CT imaging of the abdomen and pelvis was performed using the standard protocol following bolus administration of intravenous contrast. CONTRAST:  OMNIPAQUE IOHEXOL 300 MG/ML  SOLN COMPARISON:  08/13/2018 FINDINGS: Lower chest: Scarring in the lung bases.  No acute abnormality. Hepatobiliary: Small gallstones layering within the gallbladder. Diffuse fatty infiltration of the liver. No focal hepatic abnormality. Pancreas: No focal abnormality or ductal dilatation. Spleen: No focal abnormality.  Normal size. Adrenals/Urinary Tract: Exophytic intermediate density 1.4 cm lesion off the anterior midpole left kidney, low-density cysts seen within the right mid and lower pole. No hydronephrosis. Adrenal glands and urinary bladder are unremarkable. Intermediate density lesion in the mid pole posteriorly of the left kidney measures 1.9 cm. This previously measured 1.2 cm. Stomach/Bowel: Sigmoid diverticulosis. No active diverticulitis. Normal appendix. Stomach and small bowel decompressed, unremarkable. Vascular/Lymphatic: Aortic atherosclerosis. No enlarged abdominal or pelvic lymph nodes. Reproductive: No visible focal abnormality. Other: No free fluid or free air. Small left inguinal hernia containing fat. Musculoskeletal: No acute bony abnormality. IMPRESSION: Small indeterminate lesions within both kidneys. And indeterminate lesion in the posterior midpole of the left kidney is slightly enlarged since prior study. When the patient is clinically stable and able to follow directions and hold their breath (preferably as an outpatient) further evaluation with dedicated abdominal MRI should be considered. Fatty infiltration of the liver. Cholelithiasis. Aortic atherosclerosis. Sigmoid diverticulosis. Electronically Signed   By: Charlett Nose M.D.   On: 12/05/2019 23:30   DG Chest  Port 1 View  Result Date: 12/05/2019 CLINICAL DATA:  Shortness of breath EXAM: PORTABLE CHEST 1 VIEW COMPARISON:  None. FINDINGS: Mild bronchitic changes. No focal consolidation or effusion. Borderline heart size. No pneumothorax. IMPRESSION: Mild bronchitic changes. No focal pulmonary infiltrate.  The Electronically Signed   By: Jasmine Pang M.D.   On: 12/05/2019 22:17   ECHOCARDIOGRAM COMPLETE  Result Date: 12/07/2019    ECHOCARDIOGRAM REPORT   Patient Name:   SUDAIS BANGHART Date of Exam: 12/07/2019 Medical Rec #:  [id number]   Height:       73.0 in Accession #:    [id number]  Weight:       240.0 lb Date of Birth:  03/02/1951  BSA:          2.325 m Patient Age:    68 years    BP:           172/105 mmHg Patient Gender: M           HR:           77 bpm. [facility] Procedure: 2D Echo, Cardiac Doppler and Color Doppler Indications:    Orthostatic hypotension [458.0.ICD-9-CM]  History:        Patient has prior history of Echocardiogram examinations, most                 recent 08/14/2018. CAD and Previous Myocardial Infarction; Risk                 Factors:Hypertension and Diabetes. H/o Atrial fibrillation with                 RVR (HCC) (Resolved).  Sonographer:    Celesta Gentile RCS Referring Phys: [phone] Lizanne Erker CALDWELL POWELL JR IMPRESSIONS  1. There is mild apical hypokinesis. . Left ventricular ejection fraction, by estimation, is 60 to 65%. The left ventricle has normal function. The left ventricle demonstrates regional wall motion abnormalities (see scoring diagram/findings for description). There is moderate left ventricular hypertrophy. Left ventricular diastolic parameters are consistent with Grade I diastolic dysfunction (impaired relaxation).  2. Right ventricular systolic function is normal. The right ventricular size is normal.  3. Left atrial size was mildly dilated.  4. The mitral  valve is normal in structure. Trivial mitral valve regurgitation. No evidence of mitral stenosis.  5.  Indeterminant number of aortic valve cusps, possibly bicuspid. . The aortic valve has an indeterminant number of cusps. Aortic valve regurgitation is not visualized. No aortic stenosis is present.  6. Aortic dilatation noted. There is mild dilatation of the aortic root measuring 41 mm.  7. The inferior vena cava is dilated in size with >50% respiratory variability, suggesting right atrial pressure of 8 mmHg. FINDINGS  Left Ventricle: There is mild apical hypokinesis. Left ventricular ejection fraction, by estimation, is 60 to 65%. The left ventricle has normal function. The left ventricle demonstrates regional wall motion abnormalities. The left ventricular internal cavity size was normal in size. There is moderate left ventricular hypertrophy. Left ventricular diastolic parameters are consistent with Grade I diastolic dysfunction (impaired relaxation). Normal left ventricular filling pressure. Right Ventricle: The right ventricular size is normal. No increase in right ventricular wall thickness. Right ventricular systolic function is normal. Left Atrium: Left atrial size was mildly dilated. Right Atrium: Right atrial size was normal in size. Pericardium: There is no evidence of pericardial effusion. Mitral Valve: The mitral valve is normal in structure. Trivial mitral valve regurgitation. No evidence of mitral valve stenosis. Tricuspid Valve: The tricuspid valve is normal in structure. Tricuspid valve regurgitation is not demonstrated. No evidence of tricuspid stenosis. Aortic Valve: Indeterminant number of aortic valve cusps, possibly bicuspid. The aortic valve has an indeterminant number of cusps. . There is mild thickening and mild calcification of the aortic valve. Aortic valve regurgitation is not visualized. No aortic stenosis is present. Mild aortic valve annular calcification. There is mild thickening of the aortic valve. There is mild calcification of the aortic valve. Aortic valve mean gradient measures  4.8 mmHg. Aortic valve peak gradient measures 10.2 mmHg. Aortic valve area, by VTI measures 2.79 cm. Pulmonic Valve: The pulmonic valve was not well visualized. Pulmonic valve regurgitation is not visualized. No evidence of pulmonic stenosis. Aorta: Aortic dilatation noted. There is mild dilatation of the aortic root measuring 41 mm. Pulmonary Artery: Indeterminate PASP, inadequate TR jet. Venous: The inferior vena cava is dilated in size with greater than 50% respiratory variability, suggesting right atrial pressure of 8 mmHg. IAS/Shunts: No atrial level shunt detected by color flow Doppler.  LEFT VENTRICLE PLAX 2D LVIDd:         5.76 cm  Diastology LVIDs:         3.91 cm  LV e' lateral:   7.62 cm/s LV PW:         1.24 cm  LV E/e' lateral: 11.7 LV IVS:        1.38 cm  LV e' medial:    7.29 cm/s LVOT diam:     2.10 cm  LV E/e' medial:  12.2 LV SV:         79 LV SV Index:   34 LVOT Area:     3.46 cm  RIGHT VENTRICLE RV S prime:     14.65 cm/s TAPSE (M-mode): 2.4 cm LEFT ATRIUM           Index       RIGHT ATRIUM           Index LA diam:      3.40 cm 1.46 cm/m  RA Area:     18.60 cm LA Vol (A2C): 41.5 ml 17.85 ml/m RA Volume:   46.50 ml  20.00 ml/m LA Vol (A4C): 74.7 ml 32.13 ml/m  AORTIC VALVE AV Area (Vmax):    2.30 cm AV Area (Vmean):   2.58 cm AV Area (VTI):     2.79 cm AV Vmax:           159.49 cm/s AV Vmean:          100.310 cm/s AV VTI:            0.283 m AV Peak Grad:      10.2 mmHg AV Mean Grad:      4.8 mmHg LVOT Vmax:         105.95 cm/s LVOT Vmean:        74.850 cm/s LVOT VTI:          0.227 m LVOT/AV VTI ratio: 0.80  AORTA Ao Root diam: 4.20 cm MITRAL VALVE MV Area (PHT): 3.77 cm     SHUNTS MV Decel Time: 201 msec     Systemic VTI:  0.23 m MV E velocity: 89.20 cm/s   Systemic Diam: 2.10 cm MV Lewin Pellow velocity: 124.00 cm/s MV E/Genessis Flanary ratio:  0.72 Dina Rich MD Electronically signed by Dina Rich MD Signature Date/Time: 12/07/2019/4:44:47 PM    Final     Microbiology: Recent Results (from the  past 240 hour(s))  SARS Coronavirus 2 by RT PCR (hospital order, performed in [facility] hospital lab) Nasopharyngeal Nasopharyngeal Swab     Status: None   Collection Time: 12/06/19 12:15 AM   Specimen: Nasopharyngeal Swab  Result Value Ref Range Status   SARS Coronavirus 2 NEGATIVE NEGATIVE Final    Comment: (NOTE) SARS-CoV-2 target nucleic acids are NOT DETECTED.  The SARS-CoV-2 RNA is generally detectable in upper and lower respiratory specimens during the acute phase of infection. The lowest concentration of SARS-CoV-2 viral copies this assay can detect is 250 copies / mL. Gaylin Bulthuis negative result does not preclude SARS-CoV-2 infection and should not be used as the sole basis for treatment or other patient management decisions.  Timeka Goette negative result may occur with improper specimen collection / handling, submission of specimen other than nasopharyngeal swab, presence of viral mutation(s) within the areas targeted by this assay, and inadequate number of viral copies (<250 copies / mL). Chandlar Staebell negative result must be combined with clinical observations, patient history, and epidemiological information.  Fact Sheet for Patients:   BoilerBrush.com.cy  Fact Sheet for Healthcare Providers: https://pope.com/  This test is not yet approved or  cleared by the [place] FDA and has been authorized for detection and/or diagnosis of SARS-CoV-2 by FDA under an Emergency Use Authorization (EUA).  This EUA will remain in effect (meaning this test can be used) for the duration of the COVID-19 declaration under Section 564(b)(1) of the Act, 21 U.S.C. section 360bbb-3(b)(1), unless the authorization is terminated or revoked sooner.  Performed at [facility], [address] [place], [place] [address]      Labs: Basic Metabolic Panel: Recent Labs  Lab 12/05/19 2132 12/06/19 0520 12/07/19 0445  NA 136 135 139  K 3.1* 3.0* 2.8*  CL 90* 93* 100   CO2 30 30 28   GLUCOSE 306* 247* 143*  BUN 15 17 22   CREATININE 0.92 0.99 0.83  CALCIUM 10.1 9.0 8.3*  MG  --   --  2.0   Liver Function Tests: Recent Labs  Lab 12/05/19 2132  AST 27  ALT 45*  ALKPHOS 87  BILITOT 1.0  PROT 8.6*  ALBUMIN 4.8   Recent Labs  Lab 12/05/19 2132  LIPASE 23   No results for input(s):  AMMONIA in the last 168 hours. CBC: Recent Labs  Lab 12/05/19 2132 12/06/19 0520 12/07/19 0445  WBC 11.8* 11.4* 8.6  NEUTROABS 9.8*  --   --   HGB 14.5 12.8* 12.4*  HCT 48.1 42.5 41.2  MCV 74.9* 75.6* 76.4*  PLT 336 293 268   Cardiac Enzymes: No results for input(s): CKTOTAL, CKMB, CKMBINDEX, TROPONINI in the last 168 hours. BNP: BNP (last 3 results) No results for input(s): BNP in the last 8760 hours.  ProBNP (last 3 results) No results for input(s): PROBNP in the last 8760 hours.  CBG: Recent Labs  Lab 12/06/19 1701 12/06/19 2013 12/07/19 0750 12/07/19 1144 12/07/19 1717  GLUCAP 215* 183* 145* 222* 228*       Signed:  Lacretia Nicksaldwell Powell MD.  [facility] 12/07/2019, 7:21 PM

## 2019-12-07 NOTE — Plan of Care (Signed)
?  Problem: Education: ?Goal: Knowledge of General Education information will improve ?Description: Including pain rating scale, medication(s)/side effects and non-pharmacologic comfort measures ?Outcome: Progressing ?  ?Problem: Health Behavior/Discharge Planning: ?Goal: Ability to manage health-related needs will improve ?Outcome: Progressing ?  ?Problem: Clinical Measurements: ?Goal: Ability to maintain clinical measurements within normal limits will improve ?Outcome: Progressing ?Goal: Will remain free from infection ?Outcome: Progressing ?Goal: Diagnostic test results will improve ?Outcome: Progressing ?Goal: Cardiovascular complication will be avoided ?Outcome: Progressing ?  ?Problem: Activity: ?Goal: Risk for activity intolerance will decrease ?Outcome: Progressing ?  ?Problem: Nutrition: ?Goal: Adequate nutrition will be maintained ?Outcome: Progressing ?  ?Problem: Coping: ?Goal: Level of anxiety will decrease ?Outcome: Progressing ?  ?Problem: Elimination: ?Goal: Will not experience complications related to bowel motility ?Outcome: Progressing ?  ?Problem: Pain Managment: ?Goal: General experience of comfort will improve ?Outcome: Progressing ?  ?Problem: Safety: ?Goal: Ability to remain free from injury will improve ?Outcome: Progressing ?  ?Problem: Skin Integrity: ?Goal: Risk for impaired skin integrity will decrease ?Outcome: Progressing ?  ?

## 2019-12-08 ENCOUNTER — Other Ambulatory Visit: Payer: Self-pay | Admitting: *Deleted

## 2019-12-08 DIAGNOSIS — Z0001 Encounter for general adult medical examination with abnormal findings: Secondary | ICD-10-CM | POA: Diagnosis not present

## 2019-12-08 DIAGNOSIS — I2510 Atherosclerotic heart disease of native coronary artery without angina pectoris: Secondary | ICD-10-CM | POA: Diagnosis not present

## 2019-12-15 DIAGNOSIS — L723 Sebaceous cyst: Secondary | ICD-10-CM | POA: Diagnosis not present

## 2019-12-15 DIAGNOSIS — R141 Gas pain: Secondary | ICD-10-CM | POA: Diagnosis not present

## 2019-12-15 DIAGNOSIS — G4710 Hypersomnia, unspecified: Secondary | ICD-10-CM | POA: Diagnosis not present

## 2019-12-15 DIAGNOSIS — E876 Hypokalemia: Secondary | ICD-10-CM | POA: Diagnosis not present

## 2019-12-15 DIAGNOSIS — E1169 Type 2 diabetes mellitus with other specified complication: Secondary | ICD-10-CM | POA: Diagnosis not present

## 2019-12-15 DIAGNOSIS — Z712 Person consulting for explanation of examination or test findings: Secondary | ICD-10-CM | POA: Diagnosis not present

## 2019-12-15 DIAGNOSIS — G2581 Restless legs syndrome: Secondary | ICD-10-CM | POA: Diagnosis not present

## 2019-12-15 DIAGNOSIS — E1165 Type 2 diabetes mellitus with hyperglycemia: Secondary | ICD-10-CM | POA: Diagnosis not present

## 2019-12-15 DIAGNOSIS — Z0189 Encounter for other specified special examinations: Secondary | ICD-10-CM | POA: Diagnosis not present

## 2019-12-15 DIAGNOSIS — K8020 Calculus of gallbladder without cholecystitis without obstruction: Secondary | ICD-10-CM | POA: Diagnosis not present

## 2019-12-15 NOTE — Patient Outreach (Signed)
[facility]) Care Management  12/15/2019  Russell Morrow 21-Nov-1950 [id number]  Intial telephone outreach and transition of care.  [name] gives permission for NP to access MR and to participate in [facility] Services.  He gives hx of present problems including discovery of gall stones causing his abdominal pain. He had 2 syncopal episodes while in the hospital. Dx testing did not reveal the cause although he had been very sleep deprived. He does need CPAP. His glucose levels were high and his potassium was found to be too low. He was stabilized and sent home.  He agrees to me calling weekly for assessments of his progress and development of goals for chronic disease mgmt.  [name]. [name], MSN, [facility] Gerontological Nurse Practitioner [facility] Care Management [phone]

## 2019-12-16 DIAGNOSIS — E1169 Type 2 diabetes mellitus with other specified complication: Secondary | ICD-10-CM | POA: Diagnosis not present

## 2019-12-16 DIAGNOSIS — E782 Mixed hyperlipidemia: Secondary | ICD-10-CM | POA: Diagnosis not present

## 2019-12-16 DIAGNOSIS — I10 Essential (primary) hypertension: Secondary | ICD-10-CM | POA: Diagnosis not present

## 2019-12-16 DIAGNOSIS — R112 Nausea with vomiting, unspecified: Secondary | ICD-10-CM | POA: Diagnosis not present

## 2019-12-19 ENCOUNTER — Other Ambulatory Visit: Payer: Self-pay | Admitting: *Deleted

## 2019-12-19 NOTE — Patient Outreach (Signed)
[facility]) Care Management  12/19/2019  Russell Morrow Jul 02, 1950 [id number]   Telephone assessment, transition of care.  Called and left message to return my call.    [name]. [name], MSN, [facility] Gerontological Nurse Practitioner [facility] Care Management [phone]

## 2019-12-21 ENCOUNTER — Encounter: Payer: Self-pay | Admitting: *Deleted

## 2019-12-21 ENCOUNTER — Other Ambulatory Visit: Payer: Self-pay | Admitting: *Deleted

## 2019-12-21 NOTE — Patient Outreach (Addendum)
[facility]) Care Management  12/21/2019  Russell Morrow 08-Aug-1950 [id number]   Telephone outreach.   Russell Morrow is feeling better. His abdominal pain is diminished but he will still have discomfort in his upper epigastic region after eating.  He has had labs. His iron supplement has been increased.  Glucose levels are improving. FBS are <160, NFBS 200-250. He is now on a sliding scale insulin in addition to his Lantus and metformin.  Initial assessment completed today.  Outpatient Encounter Medications as of 12/21/2019  Medication Sig  . aspirin EC 81 MG EC tablet Take 1 tablet (81 mg total) by mouth daily.  Marland Kitchen dicyclomine (BENTYL) 10 MG capsule Take 10 mg by mouth 4 (four) times daily as needed.  . diphenhydrAMINE (BENADRYL) 25 mg capsule Take 25 mg by mouth at bedtime. (Patient not taking: Reported on 12/06/2019)  . esomeprazole (NEXIUM) 20 MG capsule Take 20 mg by mouth 2 (two) times daily before a meal.  . Evolocumab (REPATHA) 140 MG/ML SOSY Inject 140 mg into the skin every 14 (fourteen) days. (Patient not taking: Reported on 12/06/2019)  . FEROSUL 325 (65 Fe) MG tablet Take 325 mg by mouth daily.  Marland Kitchen LANTUS SOLOSTAR 100 UNIT/ML Solostar Pen Inject 35 Units into the skin 2 (two) times daily.  . metFORMIN (GLUCOPHAGE) 1000 MG tablet Take 1 tablet (1,000 mg total) by mouth 2 (two) times daily with a meal.  . metoprolol succinate (TOPROL-XL) 25 MG 24 hr tablet Take 3 tablets (75 mg total) by mouth daily.  . nitroGLYCERIN (NITROSTAT) 0.4 MG SL tablet Place 1 tablet (0.4 mg total) under the tongue every 5 (five) minutes x 3 doses as needed for chest pain.  . Olmesartan-amLODIPine-HCTZ 40-5-25 MG TABS Take 1 tablet by mouth daily.  . ondansetron (ZOFRAN) 4 MG tablet Take 4 mg by mouth every 6 (six) hours as needed for nausea.   . potassium chloride SA (K-DUR,KLOR-CON) 20 MEQ tablet Take 20 mEq by mouth daily.  . pregabalin (LYRICA) 75 MG capsule Take 1 capsule (75 mg total)  by mouth at bedtime.  Marland Kitchen rOPINIRole (REQUIP) 1 MG tablet Take 2 mg by mouth at bedtime.  . tadalafil (CIALIS) 10 MG tablet Take 10 mg by mouth daily as needed for erectile dysfunction.   . temazepam (RESTORIL) 15 MG capsule Take 15 mg by mouth at bedtime.   No facility-administered encounter medications on file as of 12/21/2019.   Depression screen PHQ 2/9 12/21/2019  Decreased Interest 0  Down, Depressed, Hopeless 0  PHQ - 2 Score 0   Fall Risk  12/21/2019  Falls in the past year? 1  Number falls in past yr: 1  Injury with Fall? 1  Risk for fall due to : History of fall(s)  Risk for fall due to: Comment Electrolyte imbalance.  Follow up Falls evaluation completed   Will send pt his initial pt packet, [employer], Advanced directives forms.  Will call again in one week.  Russell Morrow. Russell Estelle, MSN, [facility] Gerontological Nurse Practitioner [facility] Care Management [phone]

## 2019-12-22 ENCOUNTER — Ambulatory Visit (INDEPENDENT_AMBULATORY_CARE_PROVIDER_SITE_OTHER): Payer: Medicare Other | Admitting: Cardiology

## 2019-12-22 ENCOUNTER — Encounter: Payer: Self-pay | Admitting: *Deleted

## 2019-12-22 ENCOUNTER — Encounter: Payer: Self-pay | Admitting: Cardiology

## 2019-12-22 VITALS — BP 168/98 | HR 90 | Ht 73.0 in | Wt 243.6 lb

## 2019-12-22 DIAGNOSIS — I471 Supraventricular tachycardia, unspecified: Secondary | ICD-10-CM

## 2019-12-22 DIAGNOSIS — R079 Chest pain, unspecified: Secondary | ICD-10-CM

## 2019-12-22 DIAGNOSIS — I2510 Atherosclerotic heart disease of native coronary artery without angina pectoris: Secondary | ICD-10-CM | POA: Diagnosis not present

## 2019-12-22 MED ORDER — OLMESARTAN-AMLODIPINE-HCTZ 40-10-25 MG PO TABS: ORAL_TABLET | ORAL | 1 refills | Status: DC

## 2019-12-22 MED ORDER — FUROSEMIDE 40 MG PO TABS
ORAL_TABLET | ORAL | 1 refills | Status: DC
Start: 2019-12-22 — End: 2020-06-18

## 2019-12-22 NOTE — Patient Instructions (Addendum)
Your physician recommends that you schedule a follow-up appointment in: 6 WEEKS WITH DR BRANCH OR EXTENDER   Your physician has recommended you make the following change in your medication:   INCREASE OMESARTAN/AMLODIPINE/HCTZ 40/10/25 MG DAILY   START LASIX 40 MG AS NEEDED FOR SWELLING   Your physician has requested that you have a lexiscan myoview. For further information please visit [email or web address] Please follow instruction sheet, as given.  Thank you for choosing [facility]!!

## 2019-12-22 NOTE — Progress Notes (Signed)
Clinical Summary [name] is a 69 y.o.male seen today for follow up of the following medical problems.   1. CAD - 08/2018 cath: LM patent, ostial LAD 40%, LCX prox occlusion and thrombotic, RCA no significant idsease. S/p DES to LCX 08/2018 echo LVEF 45-50%, inferior/lateral wall hypokinesis.    11/2019 echo LVEF 60-65%, grade I DDx, indet number of AV cusps possibly bicuspid. Aortic root dilatation mild 41 mm, apical hypokinesis  - burning pain 30 min after eating. Some exertional chest pains when working in his yard. Burning/pressure mid chest, resolves. Some SOB associated with - compliant with meds   2. LE edema - some recent LE edema, pitting - he will take extra HCTZ on his own at times   3. 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    5. Kidney lesion   6.Hyperlipidemia - intolerant to multiple statins in the past includin atorva, crestor, prava, simva, zetia - tried crestor 5mg  once a week during last visit - did not tolerate.      7. AMS - recent admission 11/2019 From discharge summary: - "seen by Dr. 12/2019 who suspected multifactorial severe hypersomnia from OSA and RLS"  8. 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 Wife had told him he was alterered, bobbing his head. Wife stood him up to walk and fell to ground. He denies full LOC - episode in the hopsital while sitting by the window. He does not recall details.     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 CAD (coronary artery disease)    a. mild nonobstructive CAD by cath in 2010 b. NST in 11/2015 showing a fixed defect with no ischemia and Coronary CT showing nonobstructive CAD along LAD and LCx c. 08/2018: s/p  NSTEMI with cath showing acute total occlusion of LCx (treated with DES) and nonobstructive CAD along RCA and LAD.   . Diabetes mellitus without complication (HCC)   . Diverticulitis   . Gallstones   . Hypertension   . MI (myocardial infarction) (HCC)      Allergies  Allergen Reactions  . Statins     Muscle aches, liver pain; Tried Atorvastatin, Crestor, Pravastatin, and Simvastatin  . Zetia [Ezetimibe] Other (See Comments)    Muscle Aches     Current Outpatient Medications  Medication Sig Dispense Refill  . aspirin EC 81 MG EC tablet Take 1 tablet (81 mg total) by mouth daily.    09/2018 dicyclomine (BENTYL) 10 MG capsule Take 10 mg by mouth 4 (four) times daily as needed.    . diphenhydrAMINE (BENADRYL) 25 mg capsule Take 25 mg by mouth at bedtime. (Patient not taking: Reported on 12/06/2019)    . esomeprazole (NEXIUM) 20 MG capsule Take 20 mg by mouth 2 (two) times daily before a meal.    . Evolocumab (REPATHA) 140 MG/ML SOSY Inject 140 mg into the skin every 14 (fourteen) days. (Patient not taking: Reported on 12/06/2019) 140 mL 6  . FEROSUL 325 (65 Fe) MG tablet Take 325 mg by mouth daily.    12/08/2019 LANTUS SOLOSTAR 100 UNIT/ML Solostar Pen Inject 35 Units into the skin 2 (two) times daily. 15 mL 11  . metFORMIN (GLUCOPHAGE) 1000  MG tablet Take 1 tablet (1,000 mg total) by mouth 2 (two) times daily with a meal.    . metoprolol succinate (TOPROL-XL) 25 MG 24 hr tablet Take 3 tablets (75 mg total) by mouth daily. 90 tablet 5  . nitroGLYCERIN (NITROSTAT) 0.4 MG SL tablet Place 1 tablet (0.4 mg total) under the tongue every 5 (five) minutes x 3 doses as needed for chest pain. 25 tablet 2  . Olmesartan-amLODIPine-HCTZ 40-5-25 MG TABS Take 1 tablet by mouth daily.    . ondansetron (ZOFRAN) 4 MG tablet Take 4 mg by mouth every 6 (six) hours as needed for nausea.     . potassium chloride SA (K-DUR,KLOR-CON) 20 MEQ tablet Take 20 mEq by mouth daily.    . pregabalin (LYRICA) 75 MG capsule Take 1  capsule (75 mg total) by mouth at bedtime. 30 capsule 0  . rOPINIRole (REQUIP) 1 MG tablet Take 2 mg by mouth at bedtime.    . tadalafil (CIALIS) 10 MG tablet Take 10 mg by mouth daily as needed for erectile dysfunction.     . temazepam (RESTORIL) 15 MG capsule Take 15 mg by mouth at bedtime.     No current facility-administered medications for this visit.     Past Surgical History:  Procedure Laterality Date  . CERVICAL FUSION    . CORONARY STENT INTERVENTION N/A 08/13/2018   Procedure: CORONARY STENT INTERVENTION;  Surgeon: Tonny Bollman, MD;  Location[facility] INVASIVE CV LAB;  Service: Cardiovascular;  Laterality: N/A;  . LEFT HEART CATH AND CORONARY ANGIOGRAPHY N/A 08/13/2018   Procedure: LEFT HEART CATH AND CORONARY ANGIOGRAPHY;  Surgeon: Tonny Bollman, MD;  Location[facility] IN[facility]AS[facility] CV LAB;  Service: Cardiovascular;  Laterality: N/A;  . nasal septum repair       Allergies  Allergen Reactions  . Statins     Muscle aches, liver pain; Tried Atorvastatin, Crestor, Pravastatin, and Simvastatin  . Zetia [Ezetimibe] Other (See Comments)    Muscle Aches      Family History  Problem Relation Age of Onset  . Cancer Mother        breast  . Hypertension Mother   . Diabetes Father   . Heart disease Father   . Hypertension Father   . Diabetes Brother   . Heart disease Brother   . Hypertension Brother   . Colon cancer Neg Hx      Social History [name] reports that he has never smoked. He has never used smokeless tobacco. [name] reports previous alcohol use of about 14.0 standard drinks of alcohol per week.   Review of Systems CONSTITUTIONAL: No weight loss, fever, chills, weakness or fatigue.  HEENT: Eyes: No visual loss, blurred vision, double vision or yellow sclerae.No hearing loss, sneezing, congestion, runny nose or sore throat.  SKIN: No rash or itching.  CARDIOVASCULAR: per hpi RESPIRATORY: No shortness of breath, cough or sputum.  GASTROINTESTINAL: No anorexia,  nausea, vomiting or diarrhea. No abdominal pain or blood.  GENITOURINARY: No burning on urination, no polyuria NEUROLOGICAL: No headache, dizziness, syncope, paralysis, ataxia, numbness or tingling in the extremities. No change in bowel or bladder control.  MUSCULOSKELETAL: No muscle, back pain, joint pain or stiffness.  LYMPHATICS: No enlarged nodes. No history of splenectomy.  PSYCHIATRIC: No history of depression or anxiety.  ENDOCRINOLOGIC: No reports of sweating, cold or heat intolerance. No polyuria or polydipsia.  Marland Kitchen   Physical Examination Today's Vitals   12/22/19 0948  BP: (!) 168/98  Pulse: 90  SpO2: 96%  Weight: 243 lb 9.6 oz (110.5 kg)  Height: 6\' 1"  (1.854 m)   Body mass index is 32.14 kg/m.  Gen: resting comfortably, no acute distress HEENT: no scleral icterus, pupils equal round and reactive, no palptable cervical adenopathy,  CV: RRR, no m/r/g, no jvd Resp: Clear to auscultation bilaterally GI: abdomen is soft, non-tender, non-distended, normal bowel sounds, no hepatosplenomegaly MSK: extremities are warm, no edema.  Skin: warm, no rash Neuro:  no focal deficits Psych: appropriate affect   Diagnostic Studies     Assessment and Plan  1. CAD - some recent exertional chest pain symptoms, stable in frequency and severity -obtain lexiscan to further risk stratify - increase norvasc to 10mg  daily as additional antianginal - symptoms would suggest stable angina, if low or moderate risk stress would focuse on medical therapy unless significant progression of symptoms.   2. PSVT/ATACH - no symptoms, continue to monitor.   3. Near syncope - unclear episodes during hospital admission with general altered mental status - no clear cardiac etiology at this time, if recurent episodes in absence of his AMS would consider outpatient monitor  4. HTN  above goal, increase norvasc portion of his combniation pill to 10mg .     , M.D.

## 2019-12-28 ENCOUNTER — Other Ambulatory Visit: Payer: Self-pay | Admitting: *Deleted

## 2019-12-28 NOTE — Patient Outreach (Signed)
[facility]) Care Management  12/28/2019  Russell Morrow 21-Apr-1951 [id number]  Telephone assessment/transition of care week #3. Left message for a return call.  Note pt saw cardiology and he had instructions to:  INCREASE OMESARTAN/AMLODIPINE/HCTZ 40/10/25 MG DAILY   START LASIX 40 MG AS NEEDED FOR SWELLING  Pt reports today...   Russell Morrow. [name], MSN, [facility] Gerontological Nurse Practitioner [facility] Care Management [phone]

## 2019-12-29 ENCOUNTER — Encounter (HOSPITAL_COMMUNITY): Payer: Self-pay

## 2019-12-29 ENCOUNTER — Ambulatory Visit (HOSPITAL_COMMUNITY)
Admission: RE | Admit: 2019-12-29 | Discharge: 2019-12-29 | Disposition: A | Discharge status: Home / Self Care | Payer: Medicare Other | Source: Ambulatory Visit | Attending: Cardiology | Admitting: Cardiology

## 2019-12-29 ENCOUNTER — Other Ambulatory Visit: Payer: Self-pay

## 2019-12-29 ENCOUNTER — Encounter (HOSPITAL_COMMUNITY)
Admission: RE | Admit: 2019-12-29 | Discharge: 2019-12-29 | Disposition: A | Discharge status: Home / Self Care | Payer: Medicare Other | Source: Ambulatory Visit | Attending: Cardiology | Admitting: Cardiology

## 2019-12-29 DIAGNOSIS — R079 Chest pain, unspecified: Secondary | ICD-10-CM | POA: Diagnosis not present

## 2019-12-29 LAB — NM MYOCAR MULTI W/SPECT W/WALL MOTION / EF
LV dias vol: 131 mL (ref 62–150)
LV sys vol: 74 mL
Peak HR: 101 {beats}/min
RATE: 0.6
Rest HR: 72 {beats}/min
SDS: 11
SRS: 11
SSS: 22
TID: 0.99

## 2019-12-29 MED ORDER — TECHNETIUM TC 99M TETROFOSMIN IV KIT
10.0000 | PACK | Freq: Once | INTRAVENOUS | Status: AC | PRN
  Administered 2019-12-29: 10.2 via INTRAVENOUS

## 2019-12-29 MED ORDER — REGADENOSON 0.4 MG/5ML IV SOLN
INTRAVENOUS | Status: AC
  Administered 2019-12-29: 0.4 mg via INTRAVENOUS
  Filled 2019-12-29: qty 5

## 2019-12-29 MED ORDER — TECHNETIUM TC 99M TETROFOSMIN IV KIT
30.0000 | PACK | Freq: Once | INTRAVENOUS | Status: AC | PRN
  Administered 2019-12-29: 32 via INTRAVENOUS

## 2019-12-29 MED ORDER — SODIUM CHLORIDE FLUSH 0.9 % IV SOLN
INTRAVENOUS | Status: AC
  Administered 2019-12-29: 10 mL via INTRAVENOUS
  Filled 2019-12-29: qty 10

## 2020-01-04 ENCOUNTER — Telehealth: Payer: Self-pay | Admitting: *Deleted

## 2020-01-04 ENCOUNTER — Other Ambulatory Visit: Payer: Self-pay | Admitting: *Deleted

## 2020-01-04 NOTE — Telephone Encounter (Signed)
-----   Message from Eustace Moore, RN sent at 12/30/2019  4:05 PM EDT -----  ----- Message ----- From: Antoine Poche, MD Sent: 12/30/2019   3:59 PM EDT To: Eustace Moore, RN  Stress test suggests a moderate area of blockage. Since his symptoms are stable we can try treating this with the medication changes we made last visit. If progression of symptoms would need to consider cath. Keep our f/u, update [place] if significant change in symptoms  Dominga Ferry MD

## 2020-01-04 NOTE — Patient Outreach (Signed)
[facility]) Care Management  01/04/2020  Russell Morrow [birth year] [id number]   Telephone outreach to follow up on pt GI problems and diabetes control.  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  Run out of [place] sensors, Dr. Juel Burrow office. Pt does not go to the [place]. He has been able to get his meds despite being in the " donut hole."  He reports 154 FBS today. Has had one reading 3 am 54, Had large peach. Glucose climbing up to 70, then went back to bed. Glucose was up to 150  Upon arising for the day..  Pt talked to me about "[place]"  Has met with Rennis Harding. D.in the primary office.  Has appt with Donneta Romberg, NP, next week.  Has follow up with GI also next week [name]).  We will follow up in 2 weeks.  [name]. [name], MSN, [facility] Gerontological Nurse Practitioner [facility] Care Management [phone]

## 2020-01-09 ENCOUNTER — Encounter: Payer: Self-pay | Admitting: Gastroenterology

## 2020-01-09 ENCOUNTER — Ambulatory Visit: Payer: Medicare Other | Admitting: Gastroenterology

## 2020-01-09 ENCOUNTER — Other Ambulatory Visit: Payer: Self-pay | Admitting: *Deleted

## 2020-01-09 ENCOUNTER — Other Ambulatory Visit: Payer: Self-pay

## 2020-01-09 VITALS — BP 136/81 | HR 80 | Temp 97.5°F | Ht 73.0 in | Wt 244.0 lb

## 2020-01-09 DIAGNOSIS — R1013 Epigastric pain: Secondary | ICD-10-CM

## 2020-01-09 DIAGNOSIS — D509 Iron deficiency anemia, unspecified: Secondary | ICD-10-CM

## 2020-01-09 DIAGNOSIS — K219 Gastro-esophageal reflux disease without esophagitis: Secondary | ICD-10-CM

## 2020-01-09 DIAGNOSIS — N289 Disorder of kidney and ureter, unspecified: Secondary | ICD-10-CM | POA: Diagnosis not present

## 2020-01-09 DIAGNOSIS — K8050 Calculus of bile duct without cholangitis or cholecystitis without obstruction: Secondary | ICD-10-CM

## 2020-01-09 NOTE — Progress Notes (Signed)
Primary Care Physician: Celene Squibb, MD, Kary Kos, NP  Primary Gastroenterologist:  Formerly Barney Drain, MD   Chief Complaint  Patient presents with  . Cholelithiasis  . Abdominal Pain    upper     HPI: Russell Morrow is a 69 y.o. male here at the request of Dr. Nevada Crane for follow-up of gallstones.  Last seen in November 2018.  Patient was seen for gallstones back at that time.  He declined surgery consultation for consideration of cholecystectomy as his symptoms seemed to settle down. States he was found to be anemic after passing out several years ago.  [place] Work up at [place] having a colonoscopy showing diverticulosis only.  No polyps.  Upper endoscopy showed "scar from old ulcer".  We were never able to retrieve records. He reports procedures done about five years ago. Saw hematologist who worked IDA up and no hematology source found per patient. Never had iron or blood transfusions. Started oral iron and anemia resolved. He reports good balanced diet. He denies frequent blood donations.   History is bilateral small renal lesion seen on prior CT, we advised renal protocol MRI but he declined.  He reported that this was previously worked up with MRI and was told they were benign. MRI done at [facility] in [place] about 5-6 years ago per patient.   Patient states he is having increased symptoms and wants to consider getting his gallbladder out. Postprandial abdominal pain occurring daily, each meal. Worse than before. Pain starts in epigastric pain and moves to ruq then across to luq and down the left abdomen, followed by loose stools. Symptoms last about an hour. Better after the BM. Keeps up at night. Bentyl did not seem to help. Stopped about three weeks ago and no change. No melena, brbpr. Recently found to have IDA again. Has not completed hemoccult. Takes ASA 6m daily. Rarely takes ibuprofen  for his back. No ASA powders. On Nexium BID for GERD with good control of symptoms. No n/v since his cardiac stenting 08/2018.  Recently seen by Dr. BHarl Bowie  Stress test suggested "moderate" area  of blockage.  Since patient symptoms are stable, plan to treat medically. Cath only if significant change in symptoms. Has follow up later this month.   Labs from November 15, 2019: TIBC 438, iron 21 low, iron saturations 5% low, ferritin 9 low  Labs from May 2021: Total bilirubin less than 0.2, alk phos 82, AST 21, ALT 26, creatinine 0.86, white blood cell count 8100, hemoglobin 12.9 slightly low, hematocrit 41.2 normal, platelets 271,000.  Labs from December 07, 2019 during recent hospitalization: Hemoglobin 12.4, MCV 76.4, BUN 17, creatinine 0.99, A1c 8.5  Patient in the hospital back in June after presenting to the ED with complaints of abdominal cramping, excessive sleepiness, hyperglycemia.  Patient was seen by Dr. DMerlene Laughterduring hospitalization, severe hypersomnia felt to be due to multiple sleep disorders including obstructive sleep apnea (untreated due to old machine) and restless leg syndrome.  CT abdomen pelvis with contrast December 05, 2019 showed small gallstones, diffuse fatty liver, small indeterminate lesions within both kidneys, and indeterminate lesion in the posterior midpole of the left kidney slightly enlarged since prior study in 2020, MRI recommended.  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  CTs from 2014 from  outside facility with a 10 mm exophytic anterior right renal lesion dating back at least that time.          Current Outpatient Medications  Medication Sig Dispense Refill  . acetaminophen (TYLENOL) 500 MG tablet Take 500 mg by mouth as needed.    Marland Kitchen aspirin EC 81 MG EC tablet Take 1 tablet (81 mg total) by mouth daily.    Marland Kitchen dicyclomine (BENTYL) 10 MG capsule Take 10 mg  by mouth 4 (four) times daily as needed.    Marland Kitchen esomeprazole (NEXIUM) 20 MG capsule Take 40 mg by mouth 2 (two) times daily before a meal.     . FEROSUL 325 (65 Fe) MG tablet Take 325 mg by mouth 2 (two) times daily.     . furosemide (LASIX) 40 MG tablet TAKE AS NEEDED FOR SWELLING 90 tablet 1  . ibuprofen (ADVIL) 200 MG tablet Take 200 mg by mouth as needed.    Marland Kitchen LANTUS SOLOSTAR 100 UNIT/ML Solostar Pen Inject 35 Units into the skin 2 (two) times daily. 15 mL 11  . metFORMIN (GLUCOPHAGE) 1000 MG tablet Take 1 tablet (1,000 mg total) by mouth 2 (two) times daily with a meal.    . metoprolol succinate (TOPROL-XL) 25 MG 24 hr tablet Take 3 tablets (75 mg total) by mouth daily. 90 tablet 5  . nitroGLYCERIN (NITROSTAT) 0.4 MG SL tablet Place 1 tablet (0.4 mg total) under the tongue every 5 (five) minutes x 3 doses as needed for chest pain. 25 tablet 2  . Olmesartan-amLODIPine-HCTZ 40-10-25 MG TABS TAKE 1 TABLET DAILY 90 tablet 1  . potassium chloride SA (K-DUR,KLOR-CON) 20 MEQ tablet Take 20 mEq by mouth daily.    Marland Kitchen rOPINIRole (REQUIP) 1 MG tablet Take 2 mg by mouth at bedtime.    . tadalafil (CIALIS) 10 MG tablet Take 10 mg by mouth daily as needed for erectile dysfunction.     . temazepam (RESTORIL) 15 MG capsule Take 15 mg by mouth at bedtime.    . pregabalin (LYRICA) 75 MG capsule Take 1 capsule (75 mg total) by mouth at bedtime. 30 capsule 0   No current facility-administered medications for this visit.    Allergies as of 01/09/2020 - Review Complete 01/09/2020  Allergen Reaction Noted  . Statins  12/31/2016  . Zetia [ezetimibe] Other (See Comments) 08/17/2018   Past Medical History:  Diagnosis Date  . CAD (coronary artery disease)    a. mild nonobstructive CAD by cath in 2010 b. NST in 11/2015 showing a fixed defect with no ischemia and Coronary CT showing nonobstructive CAD along LAD and LCx c. 08/2018: s/p NSTEMI with cath showing acute total occlusion of LCx (treated with DES) and  nonobstructive CAD along RCA and LAD.   . Diabetes mellitus without complication ([place])   . Diverticulitis   . Gallstones   . Hypertension   . IDA (iron deficiency anemia)   . MI (myocardial infarction) ([place])   . PSVT (paroxysmal supraventricular tachycardia) ([place])   . Restless leg syndrome   . Sleep apnea    Past Surgical History:  Procedure Laterality Date  . CERVICAL FUSION    . CORONARY STENT INTERVENTION N/A 08/13/2018   Procedure: CORONARY STENT INTERVENTION;  Surgeon: Sherren Mocha, MD;  Location: [place] [facility];  Service: Cardiovascular;  Laterality: N/A;  . LEFT HEART CATH AND CORONARY ANGIOGRAPHY N/A 08/13/2018   Procedure: LEFT HEART CATH AND CORONARY ANGIOGRAPHY;  Surgeon: Sherren Mocha, MD;  Location: [place] [facility];  Service: Cardiovascular;  Laterality: N/A;  . nasal septum repair     Family History  Problem Relation Age of Onset  . Cancer Mother        breast  . Hypertension Mother   . Diabetes Father   . Heart disease Father   . Hypertension Father   . Diabetes Brother   . Heart disease Brother   . Hypertension Brother   . Colon cancer Neg Hx    Social History   Tobacco Use  . Smoking status: Current Every Day Smoker  . Smokeless tobacco: Never Used  . Tobacco comment: since age 53 - marijuana   Vaping Use  . Vaping Use: Never used  Substance Use Topics  . Alcohol use: Not Currently    Alcohol/week: 14.0 standard drinks    Types: 14 Glasses of wine per week    Comment: wine usually daily  . Drug use: Yes    Types: Marijuana    Comment: used since age 69      ROS:  General: Negative for anorexia, weight loss, fever, chills, fatigue, weakness. ENT: Negative for hoarseness, difficulty swallowing , nasal congestion. CV: Negative for chest pain, angina, palpitations, +dyspnea on exertion, peripheral edema.  Respiratory: Negative for dyspnea at rest, dyspnea on exertion, cough, sputum, wheezing.  GI: See history of present illness. GU:   Negative for dysuria, hematuria, urinary incontinence, urinary frequency, nocturnal urination.  Endo: Negative for unusual weight change.    Physical Examination:   BP (!) 136/81   Pulse 80   Temp (!) 97.5 F (36.4 C) (Temporal)   Ht '6\' 1"'  (1.854 m)   Wt (!) 244 lb (110.7 kg)   BMI 32.19 kg/m   General: Well-nourished, well-developed in no acute distress.  Eyes: No icterus. Mouth:masked Lungs: Clear to auscultation bilaterally.  Heart: Regular rate and rhythm, no murmurs rubs or gallops.  Abdomen: Bowel sounds are normal, nontender, nondistended, no hepatosplenomegaly or masses, no abdominal bruits or hernia , no rebound or guarding.   Extremities: No lower extremity edema. No clubbing or deformities. Neuro: Alert and oriented x 4   Skin: Warm and dry, no jaundice.   Psych: Alert and cooperative, normal mood and affect.  Labs:  See hpi.  Imaging Studies: NM Myocar Multi W/Spect W/Wall Motion / EF  Result Date: 12/29/2019  There was no ST segment deviation noted during stress.  Findings consistent with prior inferior/inferoapical myocardial infarction with moderate peri-infarct ischemia.  This is an intermediate risk study.  The left ventricular ejection fraction is moderately decreased (30-44%).     Impression/plan:   Pleasant 69 year old gentleman with past medical history significant for sleep apnea, hypertension, diabetes, coronary artery disease and MI status post stenting presenting for follow-up cholelithiasis, abdominal pain.  Patient has history of cholelithiasis, declined surgical evaluation several years ago because of symptoms seemed to settle down.  He is now having daily postprandial abdominal pain lasting for about an hour at a time.  Starts in the epigastrium/right upper quadrant region.  He has somewhat interesting symptoms describing radiation of pain across to the left abdomen and downward into the left lower abdomen, typically associated with a bowel  movement.  I suspect at least some of his symptoms are related to cholelithiasis.  Would recommend surgical consultation for possible cholecystectomy.  Iron deficiency anemia: Patient states he initially had symptoms more than 5 years ago, never received iron infusions or blood transfusions.  Describes having EGD and colonoscopy around that time which was unremarkable  except for diverticulosis.  We have requested records again.  Labs consistent with iron deficiency with ferritin of 9, hemoglobin mildly low.  Diet appears to be adequate.  But be concern for occult GI bleeding.  Recommend a colonoscopy plus or minus upper endoscopy in the future with Dr. Abbey Chatters. ASA III.  He has sleep apnea which is currently untreated due to "old" cpap.  I have discussed the risks, alternatives, benefits with regards to but not limited to the risk of reaction to medication, bleeding, infection, perforation and the patient is agreeable to proceed. Written consent to be obtained.  Renal lesions dating back to 2014 at least. Current imaging again suggesting MRI because lesions indeterminate by CT. Patient reports MRI done in patient and lesions were benign. We will request records.

## 2020-01-09 NOTE — Patient Instructions (Signed)
1. We will request records of prior MRI abdomen and upper endoscopy/colonoscopy for review. 2. Referral to Dr. Hassell Done for gallstones.  3. Colonoscopy with possible upper endoscopy to evaluate your symptoms and iron deficiency anemia. See separate instructions.

## 2020-01-12 DIAGNOSIS — I10 Essential (primary) hypertension: Secondary | ICD-10-CM | POA: Diagnosis not present

## 2020-01-12 DIAGNOSIS — E1165 Type 2 diabetes mellitus with hyperglycemia: Secondary | ICD-10-CM | POA: Diagnosis not present

## 2020-01-16 ENCOUNTER — Telehealth: Payer: Self-pay | Admitting: *Deleted

## 2020-01-16 ENCOUNTER — Encounter: Payer: Self-pay | Admitting: *Deleted

## 2020-01-16 NOTE — Telephone Encounter (Signed)
LMOVM to schedule TCS+/-EGD with propofol with Dr. Marletta Lor

## 2020-01-16 NOTE — Telephone Encounter (Signed)
Left message for patient to call office. Letter mailed

## 2020-01-17 NOTE — Telephone Encounter (Signed)
Letter mailed

## 2020-01-18 ENCOUNTER — Other Ambulatory Visit: Payer: Self-pay | Admitting: *Deleted

## 2020-01-18 NOTE — Patient Outreach (Signed)
[facility]) Care Management  01/18/2020  LESS WOOLSEY 27-Feb-1951 [id number]  Telephone outreach. No answer, left a message for a return call.  Mr. Drummonds answered my call on 01/19/20 and we covered the following.  F/U provider appts:   Mr. Wallace Cullens - diabetes - sliding scale increased 2 units per meal. Some times doesn't need to administer.(140>6 units, 181>8 units, 241>10 units, 301>12 units) He has not had to use 12 unit FBS range: 100-120  At 5:00 am  PM range Up to 200, but mostly 150-160 Meds: Novolog slding scale increased. Pt reports improved values . GI I- will have colonscopy being scheuduled  Provided pt with link to The [facility] Liver and Gallbladder Flush, by Evette Cristal, for his consideration and with MD approval to try.  Praised for his stepped up attempts at some dietary changes to reduce carbs which is paying off!  We will talk again in one month.  [name]. [name], MSN, [facility] Gerontological Nurse Practitioner [facility] Care Management [phone]

## 2020-01-24 ENCOUNTER — Encounter: Payer: Self-pay | Admitting: General Surgery

## 2020-01-24 ENCOUNTER — Other Ambulatory Visit: Payer: Self-pay

## 2020-01-24 ENCOUNTER — Ambulatory Visit: Payer: Medicare Other | Admitting: General Surgery

## 2020-01-24 VITALS — BP 153/88 | HR 87 | Temp 98.5°F | Resp 14 | Ht 73.0 in | Wt 241.0 lb

## 2020-01-24 DIAGNOSIS — K8020 Calculus of gallbladder without cholecystitis without obstruction: Secondary | ICD-10-CM | POA: Diagnosis not present

## 2020-01-24 NOTE — Progress Notes (Signed)
Russell Morrow; [id number]; 1951/03/01   HPI Patient is a 69 year old white male who was referred to my care by Dr. Margo Aye and Tana Coast of GI for evaluation and treatment of cholelithiasis.  Patient has a known history of cholelithiasis and was offered surgery in the past, though he delayed this as he symptomatically got better.  Over the past few months, he has had increasing epigastric and postprandial pain.  It is made worse with fatty foods.  No fever, chills, jaundice have been noted.  The episodes are usually self-limited and resolve on their own.  He was seen in June of this year in the emergency room and his liver tests were all within normal limits.  CT scan of the abdomen confirmed cholelithiasis.  He currently is asymptomatic. Past Medical History:  Diagnosis Date  . CAD (coronary artery disease)    a. mild nonobstructive CAD by cath in 2010 b. NST in 11/2015 showing a fixed defect with no ischemia and Coronary CT showing nonobstructive CAD along LAD and LCx c. 08/2018: s/p NSTEMI with cath showing acute total occlusion of LCx (treated with DES) and nonobstructive CAD along RCA and LAD.   . Diabetes mellitus without complication ([facility])   . Diverticulitis   . Gallstones   . Hypertension   . IDA (iron deficiency anemia)   . MI (myocardial infarction) ([facility])   . PSVT (paroxysmal supraventricular tachycardia) ([facility])   . Restless leg syndrome   . Sleep apnea     Past Surgical History:  Procedure Laterality Date  . CERVICAL FUSION    . CORONARY STENT INTERVENTION N/A 08/13/2018   Procedure: CORONARY STENT INTERVENTION;  Surgeon: Tonny Bollman, MD;  Location: [facility] INV[facility];  Service: Cardiovascular;  Laterality: N/A;  . LEFT HEART CATH AND CORONARY ANGIOGRAPHY N/A 08/13/2018   Procedure: LEFT HEART CATH AND CORONARY ANGIOGRAPHY;  Surgeon: Tonny Bollman, MD;  Location: [facility] INVASIVE CV LAB;  Service: Cardiovascular;  Laterality: N/A;  . nasal septum repair      Family History  Problem  Relation Age of Onset  . Cancer Mother        breast  . Hypertension Mother   . Diabetes Father   . Heart disease Father   . Hypertension Father   . Diabetes Brother   . Heart disease Brother   . Hypertension Brother   . Colon cancer Neg Hx     Current Outpatient Medications on File Prior to Visit  Medication Sig Dispense Refill  . acetaminophen (TYLENOL) 500 MG tablet Take 500 mg by mouth as needed.    Marland Kitchen aspirin EC 81 MG EC tablet Take 1 tablet (81 mg total) by mouth daily.    Marland Kitchen dicyclomine (BENTYL) 10 MG capsule Take 10 mg by mouth 4 (four) times daily as needed.    Marland Kitchen esomeprazole (NEXIUM) 20 MG capsule Take 40 mg by mouth 2 (two) times daily before a meal.     . FEROSUL 325 (65 Fe) MG tablet Take 325 mg by mouth 2 (two) times daily.     . furosemide (LASIX) 40 MG tablet TAKE AS NEEDED FOR SWELLING 90 tablet 1  . ibuprofen (ADVIL) 200 MG tablet Take 200 mg by mouth as needed.    Marland Kitchen LANTUS SOLOSTAR 100 UNIT/ML Solostar Pen Inject 35 Units into the skin 2 (two) times daily. 15 mL 11  . metFORMIN (GLUCOPHAGE) 1000 MG tablet Take 1 tablet (1,000 mg total) by mouth 2 (two) times daily with a meal.    .  metoprolol succinate (TOPROL-XL) 25 MG 24 hr tablet Take 3 tablets (75 mg total) by mouth daily. 90 tablet 5  . Olmesartan-amLODIPine-HCTZ 40-10-25 MG TABS TAKE 1 TABLET DAILY 90 tablet 1  . potassium chloride SA (K-DUR,KLOR-CON) 20 MEQ tablet Take 20 mEq by mouth daily.    Marland Kitchen rOPINIRole (REQUIP) 1 MG tablet Take 2 mg by mouth at bedtime.    . tadalafil (CIALIS) 10 MG tablet Take 10 mg by mouth daily as needed for erectile dysfunction.     . temazepam (RESTORIL) 15 MG capsule Take 15 mg by mouth at bedtime.    . pregabalin (LYRICA) 75 MG capsule Take 1 capsule (75 mg total) by mouth at bedtime. 30 capsule 0   No current facility-administered medications on file prior to visit.    Allergies  Allergen Reactions  . Statins     Muscle aches, liver pain; Tried Atorvastatin, Crestor,  Pravastatin, and Simvastatin  . Zetia [Ezetimibe] Other (See Comments)    Muscle Aches    Social History   Substance and Sexual Activity  Alcohol Use Not Currently  . Alcohol/week: 14.0 standard drinks  . Types: 14 Glasses of wine per week   Comment: wine usually daily    Social History   Tobacco Use  Smoking Status Never Smoker  Smokeless Tobacco Never Used  Tobacco Comment   since age 29 - marijuana     Review of Systems  Constitutional: Negative.   Eyes: Positive for pain.  Respiratory: Positive for shortness of breath.   Cardiovascular: Negative.   Gastrointestinal: Positive for abdominal pain, heartburn and nausea.  Genitourinary: Negative.   Musculoskeletal: Positive for back pain and neck pain.  Skin: Negative.   Neurological: Positive for dizziness and sensory change.  Endo/Heme/Allergies: Negative.   Psychiatric/Behavioral: Negative.     Objective   Vitals:   01/24/20 0854  BP: (!) 153/88  Pulse: 87  Resp: 14  Temp: 98.5 F (36.9 C)  SpO2: 94%    Physical Exam Vitals reviewed.  Constitutional:      Appearance: Normal appearance. He is not ill-appearing.  HENT:     Head: Normocephalic and atraumatic.  Eyes:     General: No scleral icterus. Cardiovascular:     Rate and Rhythm: Normal rate and regular rhythm.     Heart sounds: Normal heart sounds. No murmur heard.  No friction rub. No gallop.   Pulmonary:     Effort: Pulmonary effort is normal. No respiratory distress.     Breath sounds: Normal breath sounds. No stridor. No wheezing, rhonchi or rales.  Abdominal:     General: Bowel sounds are normal. There is no distension.     Palpations: Abdomen is soft. There is no mass.     Tenderness: There is no abdominal tenderness. There is no guarding or rebound.     Hernia: No hernia is present.  Skin:    General: Skin is warm and dry.  Neurological:     Mental Status: He is alert and oriented to person, place, and time.   Previous notes  reviewed from gastroenterology  Assessment  Cholelithiasis, biliary colic Plan   Patient is scheduled for laparoscopic cholecystectomy on 01/31/2020.  The risks and benefits of the procedure including bleeding, infection, hepatobiliary injury, and the possibility of an open procedure were fully explained to the patient, who gave informed consent.

## 2020-01-24 NOTE — H&P (Signed)
Russell Morrow; [id number]; 1951/03/01   HPI Patient is a 69 year old white male who was referred to my care by Dr. Margo Aye and [facility] for evaluation and treatment of cholelithiasis.  Patient has a known history of cholelithiasis and was offered surgery in the past, though he delayed this as he symptomatically got better.  Over the past few months, he has had increasing epigastric and postprandial pain.  It is made worse with fatty foods.  No fever, chills, jaundice have been noted.  The episodes are usually self-limited and resolve on their own.  He was seen in June of this year in the emergency room and his liver tests were all within normal limits.  CT scan of the abdomen confirmed cholelithiasis.  He currently is asymptomatic. Past Medical History:  Diagnosis Date  . CAD (coronary artery disease)    a. mild nonobstructive CAD by cath in 2010 b. NST in 11/2015 showing a fixed defect with no ischemia and Coronary CT showing nonobstructive CAD along LAD and LCx c. 08/2018: s/p NSTEMI with cath showing acute total occlusion of LCx (treated with DES) and nonobstructive CAD along RCA and LAD.   . Diabetes mellitus without complication (HCC)   . Diverticulitis   . Gallstones   . Hypertension   . IDA (iron deficiency anemia)   . MI (myocardial infarction) (HCC)   . PSVT (paroxysmal supraventricular tachycardia) (HCC)   . Restless leg syndrome   . Sleep apnea     Past Surgical History:  Procedure Laterality Date  . CERVICAL FUSION    . CORONARY STENT INTERVENTION N/A 08/13/2018   Procedure: CORONARY STENT INTERVENTION;  Surgeon: Tonny Bollman, MD;  Location: [facility] INVASIVE CV LAB;  Service: Cardiovascular;  Laterality: N/A;  . LEFT HEART CATH AND CORONARY ANGIOGRAPHY N/A 08/13/2018   Procedure: LEFT HEART CATH AND CORONARY ANGIOGRAPHY;  Surgeon: Tonny Bollman, MD;  Location: [facility] INVASIVE CV LAB;  Service: Cardiovascular;  Laterality: N/A;  . nasal septum repair      Family History  Problem  Relation Age of Onset  . Cancer Mother        breast  . Hypertension Mother   . Diabetes Father   . Heart disease Father   . Hypertension Father   . Diabetes Brother   . Heart disease Brother   . Hypertension Brother   . Colon cancer Neg Hx     Current Outpatient Medications on File Prior to Visit  Medication Sig Dispense Refill  . acetaminophen (TYLENOL) 500 MG tablet Take 500 mg by mouth as needed.    Marland Kitchen aspirin EC 81 MG EC tablet Take 1 tablet (81 mg total) by mouth daily.    Marland Kitchen dicyclomine (BENTYL) 10 MG capsule Take 10 mg by mouth 4 (four) times daily as needed.    Marland Kitchen esomeprazole (NEXIUM) 20 MG capsule Take 40 mg by mouth 2 (two) times daily before a meal.     . FEROSUL 325 (65 Fe) MG tablet Take 325 mg by mouth 2 (two) times daily.     . furosemide (LASIX) 40 MG tablet TAKE AS NEEDED FOR SWELLING 90 tablet 1  . ibuprofen (ADVIL) 200 MG tablet Take 200 mg by mouth as needed.    Marland Kitchen LANTUS SOLOSTAR 100 UNIT/ML Solostar Pen Inject 35 Units into the skin 2 (two) times daily. 15 mL 11  . metFORMIN (GLUCOPHAGE) 1000 MG tablet Take 1 tablet (1,000 mg total) by mouth 2 (two) times daily with a meal.    .  metoprolol succinate (TOPROL-XL) 25 MG 24 hr tablet Take 3 tablets (75 mg total) by mouth daily. 90 tablet 5  . Olmesartan-amLODIPine-HCTZ 40-10-25 MG TABS TAKE 1 TABLET DAILY 90 tablet 1  . potassium chloride SA (K-DUR,KLOR-CON) 20 MEQ tablet Take 20 mEq by mouth daily.    . rOPINIRole (REQUIP) 1 MG tablet Take 2 mg by mouth at bedtime.    . tadalafil (CIALIS) 10 MG tablet Take 10 mg by mouth daily as needed for erectile dysfunction.     . temazepam (RESTORIL) 15 MG capsule Take 15 mg by mouth at bedtime.    . pregabalin (LYRICA) 75 MG capsule Take 1 capsule (75 mg total) by mouth at bedtime. 30 capsule 0   No current facility-administered medications on file prior to visit.    Allergies  Allergen Reactions  . Statins     Muscle aches, liver pain; Tried Atorvastatin, Crestor,  Pravastatin, and Simvastatin  . Zetia [Ezetimibe] Other (See Comments)    Muscle Aches    Social History   Substance and Sexual Activity  Alcohol Use Not Currently  . Alcohol/week: 14.0 standard drinks  . Types: 14 Glasses of wine per week   Comment: wine usually daily    Social History   Tobacco Use  Smoking Status Never Smoker  Smokeless Tobacco Never Used  Tobacco Comment   since age 16 - marijuana     Review of Systems  Constitutional: Negative.   Eyes: Positive for pain.  Respiratory: Positive for shortness of breath.   Cardiovascular: Negative.   Gastrointestinal: Positive for abdominal pain, heartburn and nausea.  Genitourinary: Negative.   Musculoskeletal: Positive for back pain and neck pain.  Skin: Negative.   Neurological: Positive for dizziness and sensory change.  Endo/Heme/Allergies: Negative.   Psychiatric/Behavioral: Negative.     Objective   Vitals:   01/24/20 0854  BP: (!) 153/88  Pulse: 87  Resp: 14  Temp: 98.5 F (36.9 C)  SpO2: 94%    Physical Exam Vitals reviewed.  Constitutional:      Appearance: Normal appearance. He is not ill-appearing.  HENT:     Head: Normocephalic and atraumatic.  Eyes:     General: No scleral icterus. Cardiovascular:     Rate and Rhythm: Normal rate and regular rhythm.     Heart sounds: Normal heart sounds. No murmur heard.  No friction rub. No gallop.   Pulmonary:     Effort: Pulmonary effort is normal. No respiratory distress.     Breath sounds: Normal breath sounds. No stridor. No wheezing, rhonchi or rales.  Abdominal:     General: Bowel sounds are normal. There is no distension.     Palpations: Abdomen is soft. There is no mass.     Tenderness: There is no abdominal tenderness. There is no guarding or rebound.     Hernia: No hernia is present.  Skin:    General: Skin is warm and dry.  Neurological:     Mental Status: He is alert and oriented to person, place, and time.   Previous notes  reviewed from gastroenterology  Assessment  Cholelithiasis, biliary colic Plan   Patient is scheduled for laparoscopic cholecystectomy on 01/31/2020.  The risks and benefits of the procedure including bleeding, infection, hepatobiliary injury, and the possibility of an open procedure were fully explained to the patient, who gave informed consent. 

## 2020-01-24 NOTE — Patient Instructions (Signed)
Laparoscopic Cholecystectomy Laparoscopic cholecystectomy is surgery to remove the gallbladder. The gallbladder is a pear-shaped organ that lies beneath the liver on the right side of the body. The gallbladder stores bile, which is a fluid that helps the body to digest fats. Cholecystectomy is often done for inflammation of the gallbladder (cholecystitis). This condition is usually caused by a buildup of gallstones (cholelithiasis) in the gallbladder. Gallstones can block the flow of bile, which can result in inflammation and pain. In severe cases, emergency surgery may be required. This procedure is done though small incisions in your abdomen (laparoscopic surgery). A thin scope with a camera (laparoscope) is inserted through one incision. Thin surgical instruments are inserted through the other incisions. In some cases, a laparoscopic procedure may be turned into a type of surgery that is done through a larger incision (open surgery). Tell a health care provider about:  Any allergies you have.  All medicines you are taking, including vitamins, herbs, eye drops, creams, and over-the-counter medicines.  Any problems you or family members have had with anesthetic medicines.  Any blood disorders you have.  Any surgeries you have had.  Any medical conditions you have.  Whether you are pregnant or may be pregnant. What are the risks? Generally, this is a safe procedure. However, problems may occur, including:  Infection.  Bleeding.  Allergic reactions to medicines.  Damage to other structures or organs.  A stone remaining in the common bile duct. The common bile duct carries bile from the gallbladder into the small intestine.  A bile leak from the cyst duct that is clipped when your gallbladder is removed. What happens before the procedure?   Medicines  Ask your health care provider about: ? Changing or stopping your regular medicines. This is especially important if you are taking  diabetes medicines or blood thinners. ? Taking medicines such as aspirin and ibuprofen. These medicines can thin your blood. Do not take these medicines before your procedure if your health care provider instructs you not to.  You may be given antibiotic medicine to help prevent infection. General instructions  Let your health care provider know if you develop a cold or an infection before surgery.  Plan to have someone take you home from the hospital or clinic.  Ask your health care provider how your surgical site will be marked or identified. What happens during the procedure?   To reduce your risk of infection: ? Your health care team will wash or sanitize their hands. ? Your skin will be washed with soap. ? Hair may be removed from the surgical area.  An IV tube may be inserted into one of your veins.  You will be given one or more of the following: ? A medicine to help you relax (sedative). ? A medicine to make you fall asleep (general anesthetic).  A breathing tube will be placed in your mouth.  Your surgeon will make several small cuts (incisions) in your abdomen.  The laparoscope will be inserted through one of the small incisions. The camera on the laparoscope will send images to a TV screen (monitor) in the operating room. This lets your surgeon see inside your abdomen.  Air-like gas will be pumped into your abdomen. This will expand your abdomen to give the surgeon more room to perform the surgery.  Other tools that are needed for the procedure will be inserted through the other incisions. The gallbladder will be removed through one of the incisions.  Your common bile duct   may be examined. If stones are found in the common bile duct, they may be removed.  After your gallbladder has been removed, the incisions will be closed with stitches (sutures), staples, or skin glue.  Your incisions may be covered with a bandage (dressing). The procedure may vary among health  care providers and hospitals. What happens after the procedure?  Your blood pressure, heart rate, breathing rate, and blood oxygen level will be monitored until the medicines you were given have worn off.  You will be given medicines as needed to control your pain.  Do not drive for 24 hours if you were given a sedative. This information is not intended to replace advice given to you by your health care provider. Make sure you discuss any questions you have with your health care provider. Document Revised: 05/08/2017 Document Reviewed: 11/12/2015 Elsevier Patient Education  2020 Elsevier Inc.  

## 2020-01-26 NOTE — Patient Instructions (Signed)
Russell Morrow  01/26/2020     @PREFPERIOPPHARMACY @   Your procedure is scheduled on  01/31/2020  Report to [facility] Morrow at  0815  A.M.  Call this number if you have problems the morning of surgery:  [phone]   Remember:  Do not eat or drink after midnight.                        Take these medicines the morning of surgery with A SIP OF WATER  Nexium, metoprolol, lyrica. Take 1/2 nighttime insulin the night before your procedure. DO NOT take any medications for diabetes the morning of your procedure.    Do not wear jewelry, make-up or nail polish.  Do not wear lotions, powders, or perfumes. Please wear deodorant and brush your teeth.  Do not shave 48 hours prior to surgery.  Men may shave face and neck.  Do not bring valuables to the hospital.  [facility] any belongings or valuables.  Contacts, dentures or bridgework may not be worn into surgery.  Leave your suitcase in the car.  After surgery it may be brought to your room.  For patients admitted to the hospital, discharge time will be determined by your treatment team.  Patients discharged the day of surgery will not be allowed to drive home.   Name and phone number of your driver:   family Special instructions:  DO NOT smoke the morning of your procedure.  Please read over the following fact sheets that you were given. Anesthesia Post-op Instructions and Care and Recovery After Surgery       Laparoscopic Cholecystectomy, Care After This sheet gives you information about how to care for yourself after your procedure. Your health care provider may also give you more specific instructions. If you have problems or questions, contact your health care provider. What can I expect after the procedure? After the procedure, it is common to have:  Pain at your incision sites. You will be given medicines to control this pain.  Mild nausea or vomiting.  Bloating and possible shoulder pain  from the air-like gas that was used during the procedure. Follow these instructions at home: Incision care   Follow instructions from your health care provider about how to take care of your incisions. Make sure you: ? Wash your hands with soap and water before you change your bandage (dressing). If soap and water are not available, use hand sanitizer. ? Change your dressing as told by your health care provider. ? Leave stitches (sutures), skin glue, or adhesive strips in place. These skin closures may need to be in place for 2 weeks or longer. If adhesive strip edges start to loosen and curl up, you may trim the loose edges. Do not remove adhesive strips completely unless your health care provider tells you to do that.  Do not take baths, swim, or use a hot tub until your health care provider approves. Ask your health care provider if you can take showers. You may only be allowed to take sponge baths for bathing.  Check your incision area every day for signs of infection. Check for: ? More redness, swelling, or pain. ? More fluid or blood. ? Warmth. ? Pus or a bad smell. Activity  Do not drive or use heavy machinery while taking prescription pain medicine.  Do not lift anything that is heavier than 10 lb (4.5  kg) until your health care provider approves.  Do not play contact sports until your health care provider approves.  Do not drive for 24 hours if you were given a medicine to help you relax (sedative).  Rest as needed. Do not return to work or school until your health care provider approves. General instructions  Take over-the-counter and prescription medicines only as told by your health care provider.  To prevent or treat constipation while you are taking prescription pain medicine, your health care provider may recommend that you: ? Drink enough fluid to keep your urine clear or pale yellow. ? Take over-the-counter or prescription medicines. ? Eat foods that are high in  fiber, such as fresh fruits and vegetables, whole grains, and beans. ? Limit foods that are high in fat and processed sugars, such as fried and sweet foods. Contact a health care provider if:  You develop a rash.  You have more redness, swelling, or pain around your incisions.  You have more fluid or blood coming from your incisions.  Your incisions feel warm to the touch.  You have pus or a bad smell coming from your incisions.  You have a fever.  One or more of your incisions breaks open. Get help right away if:  You have trouble breathing.  You have chest pain.  You have increasing pain in your shoulders.  You faint or feel dizzy when you stand.  You have severe pain in your abdomen.  You have nausea or vomiting that lasts for more than one day.  You have leg pain. This information is not intended to replace advice given to you by your health care provider. Make sure you discuss any questions you have with your health care provider. Document Revised: 05/08/2017 Document Reviewed: 11/12/2015 Elsevier Patient Education  2020 Elsevier Inc.  General Anesthesia, Adult, Care After This sheet gives you information about how to care for yourself after your procedure. Your health care provider may also give you more specific instructions. If you have problems or questions, contact your health care provider. What can I expect after the procedure? After the procedure, the following side effects are common:  Pain or discomfort at the IV site.  Nausea.  Vomiting.  Sore throat.  Trouble concentrating.  Feeling cold or chills.  Weak or tired.  Sleepiness and fatigue.  Soreness and body aches. These side effects can affect parts of the body that were not involved in surgery. Follow these instructions at home:  For at least 24 hours after the procedure:  Have a responsible adult stay with you. It is important to have someone help care for you until you are awake and  alert.  Rest as needed.  Do not: ? Participate in activities in which you could fall or become injured. ? Drive. ? Use heavy machinery. ? Drink alcohol. ? Take sleeping pills or medicines that cause drowsiness. ? Make important decisions or sign legal documents. ? Take care of children on your own. Eating and drinking  Follow any instructions from your health care provider about eating or drinking restrictions.  When you feel hungry, start by eating small amounts of foods that are soft and easy to digest (bland), such as toast. Gradually return to your regular diet.  Drink enough fluid to keep your urine pale yellow.  If you vomit, rehydrate by drinking water, juice, or clear broth. General instructions  If you have sleep apnea, surgery and certain medicines can increase your risk for breathing problems.  Follow instructions from your health care provider about wearing your sleep device: ? Anytime you are sleeping, including during daytime naps. ? While taking prescription pain medicines, sleeping medicines, or medicines that make you drowsy.  Return to your normal activities as told by your health care provider. Ask your health care provider what activities are safe for you.  Take over-the-counter and prescription medicines only as told by your health care provider.  If you smoke, do not smoke without supervision.  Keep all follow-up visits as told by your health care provider. This is important. Contact a health care provider if:  You have nausea or vomiting that does not get better with medicine.  You cannot eat or drink without vomiting.  You have pain that does not get better with medicine.  You are unable to pass urine.  You develop a skin rash.  You have a fever.  You have redness around your IV site that gets worse. Get help right away if:  You have difficulty breathing.  You have chest pain.  You have blood in your urine or stool, or you vomit  blood. Summary  After the procedure, it is common to have a sore throat or nausea. It is also common to feel tired.  Have a responsible adult stay with you for the first 24 hours after general anesthesia. It is important to have someone help care for you until you are awake and alert.  When you feel hungry, start by eating small amounts of foods that are soft and easy to digest (bland), such as toast. Gradually return to your regular diet.  Drink enough fluid to keep your urine pale yellow.  Return to your normal activities as told by your health care provider. Ask your health care provider what activities are safe for you. This information is not intended to replace advice given to you by your health care provider. Make sure you discuss any questions you have with your health care provider. Document Revised: 05/29/2017 Document Reviewed: 01/09/2017 Elsevier Patient Education  2020 Elsevier Inc. Monitored Anesthesia Care, Care After These instructions provide you with information about caring for yourself after your procedure. Your health care provider may also give you more specific instructions. Your treatment has been planned according to current medical practices, but problems sometimes occur. Call your health care provider if you have any problems or questions after your procedure. What can I expect after the procedure? After your procedure, you may:  Feel sleepy for several hours.  Feel clumsy and have poor balance for several hours.  Feel forgetful about what happened after the procedure.  Have poor judgment for several hours.  Feel nauseous or vomit.  Have a sore throat if you had a breathing tube during the procedure. Follow these instructions at home: For at least 24 hours after the procedure:      Have a responsible adult stay with you. It is important to have someone help care for you until you are awake and alert.  Rest as needed.  Do not: ? Participate in  activities in which you could fall or become injured. ? Drive. ? Use heavy machinery. ? Drink alcohol. ? Take sleeping pills or medicines that cause drowsiness. ? Make important decisions or sign legal documents. ? Take care of children on your own. Eating and drinking  Follow the diet that is recommended by your health care provider.  If you vomit, drink water, juice, or soup when you can drink without vomiting.  Make  sure you have little or no nausea before eating solid foods. General instructions  Take over-the-counter and prescription medicines only as told by your health care provider.  If you have sleep apnea, surgery and certain medicines can increase your risk for breathing problems. Follow instructions from your health care provider about wearing your sleep device: ? Anytime you are sleeping, including during daytime naps. ? While taking prescription pain medicines, sleeping medicines, or medicines that make you drowsy.  If you smoke, do not smoke without supervision.  Keep all follow-up visits as told by your health care provider. This is important. Contact a health care provider if:  You keep feeling nauseous or you keep vomiting.  You feel light-headed.  You develop a rash.  You have a fever. Get help right away if:  You have trouble breathing. Summary  For several hours after your procedure, you may feel sleepy and have poor judgment.  Have a responsible adult stay with you for at least 24 hours or until you are awake and alert. This information is not intended to replace advice given to you by your health care provider. Make sure you discuss any questions you have with your health care provider. Document Revised: 08/24/2017 Document Reviewed: 09/16/2015 Elsevier Patient Education  2020 [employer].

## 2020-01-30 ENCOUNTER — Other Ambulatory Visit: Payer: Self-pay

## 2020-01-30 ENCOUNTER — Encounter (HOSPITAL_COMMUNITY): Payer: Self-pay

## 2020-01-30 ENCOUNTER — Encounter (HOSPITAL_COMMUNITY)
Admission: RE | Admit: 2020-01-30 | Discharge: 2020-01-30 | Disposition: A | Discharge status: Home / Self Care | Payer: Medicare Other | Source: Ambulatory Visit | Attending: General Surgery | Admitting: General Surgery

## 2020-01-30 ENCOUNTER — Other Ambulatory Visit (HOSPITAL_COMMUNITY)
Admission: RE | Admit: 2020-01-30 | Discharge: 2020-01-30 | Disposition: A | Discharge status: Home / Self Care | Payer: Medicare Other | Source: Ambulatory Visit | Attending: General Surgery | Admitting: General Surgery

## 2020-01-30 DIAGNOSIS — Z20822 Contact with and (suspected) exposure to covid-19: Secondary | ICD-10-CM | POA: Insufficient documentation

## 2020-01-30 DIAGNOSIS — Z01812 Encounter for preprocedural laboratory examination: Secondary | ICD-10-CM | POA: Insufficient documentation

## 2020-01-30 LAB — COMPREHENSIVE METABOLIC PANEL
ALT: 26 U/L (ref 0–44)
AST: 17 U/L (ref 15–41)
Albumin: 4 g/dL (ref 3.5–5.0)
Alkaline Phosphatase: 56 U/L (ref 38–126)
Anion gap: 13 (ref 5–15)
BUN: 14 mg/dL (ref 8–23)
CO2: 27 mmol/L (ref 22–32)
Calcium: 9 mg/dL (ref 8.9–10.3)
Chloride: 96 mmol/L — ABNORMAL LOW (ref 98–111)
Creatinine, Ser: 0.81 mg/dL (ref 0.61–1.24)
GFR calc Af Amer: >60 mL/min (ref >60)
GFR calc non Af Amer: >60 mL/min (ref >60)
Glucose, Bld: 172 mg/dL — ABNORMAL HIGH (ref 70–99)
Potassium: 2.8 mmol/L — ABNORMAL LOW (ref 3.5–5.1)
Sodium: 136 mmol/L (ref 135–145)
Total Bilirubin: 0.6 mg/dL (ref 0.3–1.2)
Total Protein: 7.1 g/dL (ref 6.5–8.1)

## 2020-01-30 LAB — CBC WITH DIFFERENTIAL/PLATELET
Abs Immature Granulocytes: 0.01 10*3/uL (ref 0.00–0.07)
Basophils Absolute: 0 10*3/uL (ref 0.0–0.1)
Basophils Relative: 1 %
Eosinophils Absolute: 0.1 10*3/uL (ref 0.0–0.5)
Eosinophils Relative: 2 %
HCT: 44.2 % (ref 39.0–52.0)
Hemoglobin: 14.1 g/dL (ref 13.0–17.0)
Immature Granulocytes: 0 %
Lymphocytes Relative: 28 %
Lymphs Abs: 2 10*3/uL (ref 0.7–4.0)
MCH: 26.5 pg (ref 26.0–34.0)
MCHC: 31.9 g/dL (ref 30.0–36.0)
MCV: 82.9 fL (ref 80.0–100.0)
Monocytes Absolute: 0.8 10*3/uL (ref 0.1–1.0)
Monocytes Relative: 11 %
Neutro Abs: 4.2 10*3/uL (ref 1.7–7.7)
Neutrophils Relative %: 58 %
Platelets: 211 10*3/uL (ref 150–400)
RBC: 5.33 MIL/uL (ref 4.22–5.81)
RDW: 18.8 % — ABNORMAL HIGH (ref 11.5–15.5)
WBC: 7.2 10*3/uL (ref 4.0–10.5)
nRBC: 0 % (ref 0.0–0.2)

## 2020-01-31 ENCOUNTER — Other Ambulatory Visit: Payer: Self-pay

## 2020-01-31 ENCOUNTER — Encounter (HOSPITAL_COMMUNITY)
Admission: RE | Disposition: A | Discharge status: Home / Self Care | Payer: Self-pay | Source: Home / Self Care | Attending: General Surgery

## 2020-01-31 ENCOUNTER — Encounter (HOSPITAL_COMMUNITY): Payer: Self-pay | Admitting: General Surgery

## 2020-01-31 ENCOUNTER — Ambulatory Visit (HOSPITAL_COMMUNITY): Payer: Medicare Other | Admitting: Anesthesiology

## 2020-01-31 ENCOUNTER — Ambulatory Visit (HOSPITAL_COMMUNITY)
Admission: RE | Admit: 2020-01-31 | Discharge: 2020-01-31 | Disposition: A | Discharge status: Home / Self Care | Payer: Medicare Other | Attending: General Surgery | Admitting: General Surgery

## 2020-01-31 DIAGNOSIS — G4730 Sleep apnea, unspecified: Secondary | ICD-10-CM | POA: Insufficient documentation

## 2020-01-31 DIAGNOSIS — K8010 Calculus of gallbladder with chronic cholecystitis without obstruction: Secondary | ICD-10-CM | POA: Diagnosis not present

## 2020-01-31 DIAGNOSIS — Z794 Long term (current) use of insulin: Secondary | ICD-10-CM | POA: Diagnosis not present

## 2020-01-31 DIAGNOSIS — Z803 Family history of malignant neoplasm of breast: Secondary | ICD-10-CM | POA: Diagnosis not present

## 2020-01-31 DIAGNOSIS — K8020 Calculus of gallbladder without cholecystitis without obstruction: Secondary | ICD-10-CM | POA: Diagnosis not present

## 2020-01-31 DIAGNOSIS — Z79899 Other long term (current) drug therapy: Secondary | ICD-10-CM | POA: Diagnosis not present

## 2020-01-31 DIAGNOSIS — I471 Supraventricular tachycardia: Secondary | ICD-10-CM | POA: Diagnosis not present

## 2020-01-31 DIAGNOSIS — E119 Type 2 diabetes mellitus without complications: Secondary | ICD-10-CM | POA: Insufficient documentation

## 2020-01-31 DIAGNOSIS — F17200 Nicotine dependence, unspecified, uncomplicated: Secondary | ICD-10-CM | POA: Diagnosis not present

## 2020-01-31 DIAGNOSIS — Z955 Presence of coronary angioplasty implant and graft: Secondary | ICD-10-CM | POA: Insufficient documentation

## 2020-01-31 DIAGNOSIS — Z791 Long term (current) use of non-steroidal anti-inflammatories (NSAID): Secondary | ICD-10-CM | POA: Diagnosis not present

## 2020-01-31 DIAGNOSIS — I10 Essential (primary) hypertension: Secondary | ICD-10-CM | POA: Insufficient documentation

## 2020-01-31 DIAGNOSIS — Z833 Family history of diabetes mellitus: Secondary | ICD-10-CM | POA: Insufficient documentation

## 2020-01-31 DIAGNOSIS — Z7982 Long term (current) use of aspirin: Secondary | ICD-10-CM | POA: Diagnosis not present

## 2020-01-31 DIAGNOSIS — Z8249 Family history of ischemic heart disease and other diseases of the circulatory system: Secondary | ICD-10-CM | POA: Diagnosis not present

## 2020-01-31 DIAGNOSIS — Z981 Arthrodesis status: Secondary | ICD-10-CM | POA: Diagnosis not present

## 2020-01-31 DIAGNOSIS — G2581 Restless legs syndrome: Secondary | ICD-10-CM | POA: Insufficient documentation

## 2020-01-31 DIAGNOSIS — I252 Old myocardial infarction: Secondary | ICD-10-CM | POA: Insufficient documentation

## 2020-01-31 DIAGNOSIS — Z888 Allergy status to other drugs, medicaments and biological substances status: Secondary | ICD-10-CM | POA: Diagnosis not present

## 2020-01-31 DIAGNOSIS — I2510 Atherosclerotic heart disease of native coronary artery without angina pectoris: Secondary | ICD-10-CM | POA: Diagnosis not present

## 2020-01-31 HISTORY — PX: CHOLECYSTECTOMY: SHX55

## 2020-01-31 LAB — HEMOGLOBIN A1C
Hgb A1c MFr Bld: 8.1 % — ABNORMAL HIGH (ref 4.8–5.6)
Mean Plasma Glucose: 186 mg/dL

## 2020-01-31 LAB — SARS CORONAVIRUS 2 (TAT 6-24 HRS): SARS Coronavirus 2: NEGATIVE

## 2020-01-31 LAB — GLUCOSE, CAPILLARY: Glucose-Capillary: 200 mg/dL — ABNORMAL HIGH (ref 70–99)

## 2020-01-31 SURGERY — LAPAROSCOPIC CHOLECYSTECTOMY
Anesthesia: General

## 2020-01-31 MED ORDER — BUPIVACAINE LIPOSOME 1.3 % IJ SUSP
INTRAMUSCULAR | Status: AC
  Filled 2020-01-31: qty 10

## 2020-01-31 MED ORDER — ONDANSETRON HCL 4 MG/2ML IJ SOLN
INTRAMUSCULAR | Status: AC
  Filled 2020-01-31: qty 2

## 2020-01-31 MED ORDER — CIPROFLOXACIN IN D5W 400 MG/200ML IV SOLN
400.0000 mg | INTRAVENOUS | Status: AC
  Administered 2020-01-31: 400 mg via INTRAVENOUS
  Filled 2020-01-31: qty 200

## 2020-01-31 MED ORDER — ONDANSETRON HCL 4 MG/2ML IJ SOLN: 4.0000 mg | Freq: Once | INTRAMUSCULAR | Status: DC | PRN

## 2020-01-31 MED ORDER — LIDOCAINE 2% (20 MG/ML) 5 ML SYRINGE
INTRAMUSCULAR | Status: AC
  Filled 2020-01-31: qty 10

## 2020-01-31 MED ORDER — PROPOFOL 10 MG/ML IV BOLUS
INTRAVENOUS | Status: DC | PRN
  Administered 2020-01-31 (×2): 50 mg via INTRAVENOUS
  Administered 2020-01-31: 100 mg via INTRAVENOUS

## 2020-01-31 MED ORDER — GLYCOPYRROLATE PF 0.2 MG/ML IJ SOSY
PREFILLED_SYRINGE | INTRAMUSCULAR | Status: AC
  Filled 2020-01-31: qty 1

## 2020-01-31 MED ORDER — CHLORHEXIDINE GLUCONATE CLOTH 2 % EX PADS: 6.0000 | MEDICATED_PAD | Freq: Once | CUTANEOUS | Status: DC

## 2020-01-31 MED ORDER — PHENYLEPHRINE HCL (PRESSORS) 10 MG/ML IV SOLN
INTRAVENOUS | Status: DC | PRN
  Administered 2020-01-31: 100 ug via INTRAVENOUS
  Administered 2020-01-31: 200 ug via INTRAVENOUS

## 2020-01-31 MED ORDER — EPHEDRINE 5 MG/ML INJ
INTRAVENOUS | Status: AC
  Filled 2020-01-31: qty 10

## 2020-01-31 MED ORDER — FENTANYL CITRATE (PF) 100 MCG/2ML IJ SOLN
25.0000 ug | INTRAMUSCULAR | Status: DC | PRN
  Administered 2020-01-31 (×2): 50 ug via INTRAVENOUS
  Filled 2020-01-31 (×2): qty 2

## 2020-01-31 MED ORDER — HEMOSTATIC AGENTS (NO CHARGE) OPTIME
TOPICAL | Status: DC | PRN
  Administered 2020-01-31: 1 via TOPICAL

## 2020-01-31 MED ORDER — SUGAMMADEX SODIUM 200 MG/2ML IV SOLN
INTRAVENOUS | Status: DC | PRN
  Administered 2020-01-31: 30 mg via INTRAVENOUS
  Administered 2020-01-31: 20 mg via INTRAVENOUS
  Administered 2020-01-31 (×3): 50 mg via INTRAVENOUS

## 2020-01-31 MED ORDER — POTASSIUM CHLORIDE 10 MEQ/100ML IV SOLN
10.0000 meq | Freq: Once | INTRAVENOUS | Status: AC
  Administered 2020-01-31: 10 meq via INTRAVENOUS
  Filled 2020-01-31: qty 100

## 2020-01-31 MED ORDER — LACTATED RINGERS IV SOLN: INTRAVENOUS | Status: DC

## 2020-01-31 MED ORDER — PROPOFOL 10 MG/ML IV BOLUS
INTRAVENOUS | Status: AC
  Filled 2020-01-31: qty 40

## 2020-01-31 MED ORDER — CHLORHEXIDINE GLUCONATE 0.12 % MT SOLN
15.0000 mL | Freq: Once | OROMUCOSAL | Status: AC
  Filled 2020-01-31: qty 15

## 2020-01-31 MED ORDER — ONDANSETRON HCL 4 MG/2ML IJ SOLN
INTRAMUSCULAR | Status: DC | PRN
  Administered 2020-01-31: 4 mg via INTRAVENOUS

## 2020-01-31 MED ORDER — BUPIVACAINE LIPOSOME 1.3 % IJ SUSP
INTRAMUSCULAR | Status: DC | PRN
  Administered 2020-01-31: 20 mL

## 2020-01-31 MED ORDER — GLYCOPYRROLATE PF 0.2 MG/ML IJ SOSY
PREFILLED_SYRINGE | INTRAMUSCULAR | Status: DC | PRN
  Administered 2020-01-31: .1 mg via INTRAVENOUS

## 2020-01-31 MED ORDER — SODIUM CHLORIDE 0.9 % IR SOLN
Status: DC | PRN
  Administered 2020-01-31: 1000 mL

## 2020-01-31 MED ORDER — FENTANYL CITRATE (PF) 100 MCG/2ML IJ SOLN
INTRAMUSCULAR | Status: DC | PRN
  Administered 2020-01-31 (×4): 50 ug via INTRAVENOUS

## 2020-01-31 MED ORDER — ROCURONIUM BROMIDE 10 MG/ML (PF) SYRINGE
PREFILLED_SYRINGE | INTRAVENOUS | Status: DC | PRN
  Administered 2020-01-31: 50 mg via INTRAVENOUS

## 2020-01-31 MED ORDER — FENTANYL CITRATE (PF) 250 MCG/5ML IJ SOLN
INTRAMUSCULAR | Status: AC
  Filled 2020-01-31: qty 5

## 2020-01-31 MED ORDER — LIDOCAINE 2% (20 MG/ML) 5 ML SYRINGE
INTRAMUSCULAR | Status: AC
  Filled 2020-01-31: qty 5

## 2020-01-31 MED ORDER — TRAMADOL HCL 50 MG PO TABS: 50.0000 mg | ORAL_TABLET | Freq: Four times a day (QID) | ORAL | 0 refills | Status: DC | PRN

## 2020-01-31 MED ORDER — LIDOCAINE HCL (CARDIAC) PF 100 MG/5ML IV SOSY
PREFILLED_SYRINGE | INTRAVENOUS | Status: DC | PRN
  Administered 2020-01-31: 80 mg via INTRAVENOUS

## 2020-01-31 MED ORDER — KETOROLAC TROMETHAMINE 30 MG/ML IJ SOLN
15.0000 mg | Freq: Once | INTRAMUSCULAR | Status: AC
  Administered 2020-01-31: 15 mg via INTRAVENOUS
  Filled 2020-01-31: qty 1

## 2020-01-31 MED ORDER — ORAL CARE MOUTH RINSE
15.0000 mL | Freq: Once | OROMUCOSAL | Status: AC
  Administered 2020-01-31: 15 mL via OROMUCOSAL

## 2020-01-31 MED ORDER — LACTATED RINGERS IV SOLN: INTRAVENOUS | Status: DC | PRN

## 2020-01-31 MED ORDER — ROCURONIUM BROMIDE 10 MG/ML (PF) SYRINGE
PREFILLED_SYRINGE | INTRAVENOUS | Status: AC
  Filled 2020-01-31: qty 10

## 2020-01-31 SURGICAL SUPPLY — 50 items
ADH SKN CLS APL DERMABOND .7 (GAUZE/BANDAGES/DRESSINGS) ×1
APL SRG 38 LTWT LNG FL B (MISCELLANEOUS)
APPLICATOR ARISTA FLEXITIP XL (MISCELLANEOUS) IMPLANT
APPLIER CLIP ROT 10 11.4 M/L (STAPLE) ×2
APR CLP MED LRG 11.4X10 (STAPLE) ×1
BAG RETRIEVAL 10 (BASKET) ×1
CLIP APPLIE ROT 10 11.4 M/L (STAPLE) ×1 IMPLANT
CLOTH BEACON ORANGE TIMEOUT ST (SAFETY) ×2 IMPLANT
COVER LIGHT HANDLE STERIS (MISCELLANEOUS) ×4 IMPLANT
COVER WAND RF STERILE (DRAPES) ×2 IMPLANT
DERMABOND ADVANCED (GAUZE/BANDAGES/DRESSINGS) ×1
DERMABOND ADVANCED .7 DNX12 (GAUZE/BANDAGES/DRESSINGS) ×1 IMPLANT
DURAPREP 26ML APPLICATOR (WOUND CARE) ×2 IMPLANT
ELECT REM PT RETURN 9FT ADLT (ELECTROSURGICAL) ×2
ELECTRODE REM PT RTRN 9FT ADLT (ELECTROSURGICAL) ×1 IMPLANT
GLOVE BIOGEL M STRL SZ7.5 (GLOVE) ×2 IMPLANT
GLOVE BIOGEL PI IND STRL 7.0 (GLOVE) ×2 IMPLANT
GLOVE BIOGEL PI IND STRL 7.5 (GLOVE) ×1 IMPLANT
GLOVE BIOGEL PI IND STRL 8 (GLOVE) ×1 IMPLANT
GLOVE BIOGEL PI INDICATOR 7.0 (GLOVE) ×2
GLOVE BIOGEL PI INDICATOR 7.5 (GLOVE) ×1
GLOVE BIOGEL PI INDICATOR 8 (GLOVE) ×1
GLOVE SS BIOGEL STRL SZ 7.5 (GLOVE) ×1 IMPLANT
GLOVE SUPERSENSE BIOGEL SZ 7.5 (GLOVE) ×1
GLOVE SURG SS PI 7.5 STRL IVOR (GLOVE) ×2 IMPLANT
GOWN STRL REUS W/TWL LRG LVL3 (GOWN DISPOSABLE) ×6 IMPLANT
HEMOSTAT ARISTA ABSORB 3G PWDR (HEMOSTASIS) IMPLANT
HEMOSTAT SNOW SURGICEL 2X4 (HEMOSTASIS) ×2 IMPLANT
INST SET LAPROSCOPIC AP (KITS) ×2 IMPLANT
KIT TURNOVER KIT A (KITS) ×2 IMPLANT
MANIFOLD NEPTUNE II (INSTRUMENTS) ×2 IMPLANT
NEEDLE HYPO 18GX1.5 BLUNT FILL (NEEDLE) ×2 IMPLANT
NEEDLE HYPO 22GX1.5 SAFETY (NEEDLE) ×2 IMPLANT
NEEDLE INSUFFLATION 14GA 120MM (NEEDLE) ×2 IMPLANT
NS IRRIG 1000ML POUR BTL (IV SOLUTION) ×2 IMPLANT
PACK LAP CHOLE LZT030E (CUSTOM PROCEDURE TRAY) ×2 IMPLANT
PAD ARMBOARD 7.5X6 YLW CONV (MISCELLANEOUS) ×2 IMPLANT
SET BASIN LINEN APH (SET/KITS/TRAYS/PACK) ×2 IMPLANT
SET TUBE SMOKE EVAC HIGH FLOW (TUBING) ×2 IMPLANT
SLEEVE ENDOPATH XCEL 5M (ENDOMECHANICALS) ×2 IMPLANT
SUT MNCRL AB 4-0 PS2 18 (SUTURE) ×2 IMPLANT
SUT VICRYL 0 UR6 27IN ABS (SUTURE) ×4 IMPLANT
SYR 20ML LL LF (SYRINGE) ×4 IMPLANT
SYS BAG RETRIEVAL 10MM (BASKET) ×1
SYSTEM BAG RETRIEVAL 10MM (BASKET) ×1 IMPLANT
TROCAR ENDO BLADELESS 11MM (ENDOMECHANICALS) ×2 IMPLANT
TROCAR XCEL NON-BLD 5MMX100MML (ENDOMECHANICALS) ×2 IMPLANT
TROCAR XCEL UNIV SLVE 11M 100M (ENDOMECHANICALS) ×2 IMPLANT
TUBE CONNECTING 12X1/4 (SUCTIONS) ×2 IMPLANT
WARMER LAPAROSCOPE (MISCELLANEOUS) ×2 IMPLANT

## 2020-01-31 NOTE — Transfer of Care (Signed)
Immediate Anesthesia Transfer of Care Note  Patient: Russell Morrow  Procedure(s) Performed: LAPAROSCOPIC CHOLECYSTECTOMY (N/A )  Patient Location: PACU  Anesthesia Type:General  Level of Consciousness: awake, alert , oriented and patient cooperative  Airway & Oxygen Therapy: Patient Spontanous Breathing and Patient connected to face mask oxygen  Post-op Assessment: Report given to RN and Post -op Vital signs reviewed and stable  Post vital signs: Reviewed and stable  Last Vitals:  Vitals Value Taken Time  BP 164/102 01/31/20 1116  Temp    Pulse 74 01/31/20 1121  Resp 26 01/31/20 1121  SpO2 100 % 01/31/20 1121  Vitals shown include unvalidated device data.  Last Pain:  Vitals:   01/31/20 0837  TempSrc: Oral  PainSc: 0-No pain         Complications: No complications documented.

## 2020-01-31 NOTE — Anesthesia Procedure Notes (Signed)
Procedure Name: Intubation Date/Time: 01/31/2020 10:31 AM Performed by: Gayland Curry, CRNA Pre-anesthesia Checklist: Patient identified, Emergency Drugs available, Suction available and Patient being monitored Patient Re-evaluated:Patient Re-evaluated prior to induction Oxygen Delivery Method: Circle system utilized Preoxygenation: Pre-oxygenation with 100% oxygen Induction Type: IV induction Ventilation: Mask ventilation without difficulty and Oral airway inserted - appropriate to patient size Laryngoscope Size: Mac and 4 Grade View: Grade I Tube type: Oral Tube size: 7.0 mm Number of attempts: 1 Placement Confirmation: ETT inserted through vocal cords under direct vision,  positive ETCO2 and breath sounds checked- equal and bilateral Secured at: 23 cm Tube secured with: Tape Dental Injury: Teeth and Oropharynx as per pre-operative assessment

## 2020-01-31 NOTE — Op Note (Signed)
Patient:  Russell Morrow  DOB:  [birth year]  MRN:  [id number]   Preop Diagnosis: Biliary colic, cholelithiasis  Postop Diagnosis: Same  Procedure: Laparoscopic cholecystectomy  Surgeon: Franky Macho, MD  Anes: General endotracheal  Indications: Patient is a 69 year old white male who presents with biliary colic secondary to cholelithiasis.  The risks and benefits of the procedure including bleeding, infection, hepatobiliary injury, and the possibility of an open procedure were fully explained to the patient, who gave informed consent.  Procedure note: The patient was placed in the supine position.  After induction of general endotracheal anesthesia, the abdomen was prepped and draped using the usual sterile technique with ChloraPrep.  Surgical site confirmation was performed.  A supraumbilical incision was made down to the fascia.  A Veress needle was introduced into the abdominal cavity and confirmation of placement was done using saline drop test.  The abdomen was then insufflated to 17 mmHg pressure.  An 11 mm trocar was introduced into the abdominal cavity under direct visualization without difficulty.  The patient was placed in reverse Trendelenburg position and an additional 11 mm trocar was placed in the epigastric region and 5 mm trochars were placed the right upper quadrant and right flank regions.  Liver was inspected and noted within normal limits.  The gallbladder was retracted in a dynamic fashion in order to provide a critical view of the triangle of Calot.  The cystic duct was first identified.  Its juncture to the infundibulum was fully identified.  Endoclips were placed proximally and distally on the cystic duct, and the cystic duct was divided.  This was likewise done to the cystic artery.  The gallbladder was freed away from the gallbladder fossa using Bovie electrocautery.  The gallbladder was delivered through the epigastric trocar site using an Endo Catch bag.  The gallbladder  fossa was inspected and no abnormal bleeding or bile leakage was noted.  Surgicel was placed in the gallbladder fossa.  All fluid and air were then evacuated from the abdominal cavity prior to the removal of the trochars.  All wounds were irrigated with normal saline.  All wounds were injected with Exparel.  The epigastric fascia as well as the supraumbilical fascia was reapproximated using an 0 Vicryl interrupted suture.  All skin incisions were closed using staples.  Betadine ointment and dry sterile dressings were applied.  All tape and needle counts were correct at the end of the procedure.  The patient was extubated in the operating room and transferred to PACU in stable condition.  Complications: None  EBL: Minimal  Specimen: Gallbladder

## 2020-01-31 NOTE — Anesthesia Postprocedure Evaluation (Signed)
Anesthesia Post Note  Patient: Russell Morrow  Procedure(s) Performed: LAPAROSCOPIC CHOLECYSTECTOMY (N/A )  Patient location during evaluation: PACU Anesthesia Type: General Level of consciousness: awake Pain management: pain level controlled Vital Signs Assessment: post-procedure vital signs reviewed and stable Respiratory status: spontaneous breathing Cardiovascular status: blood pressure returned to baseline Postop Assessment: no headache Anesthetic complications: no   No complications documented.   Last Vitals:  Vitals:   01/31/20 1245 01/31/20 1258  BP: (!) 165/106 (!) 165/78  Pulse: 80 78  Resp: 12 14  Temp:  36.8 C  SpO2: 93% 95%    Last Pain:  Vitals:   01/31/20 1258  TempSrc: Oral  PainSc: 3                  [name]

## 2020-01-31 NOTE — Anesthesia Preprocedure Evaluation (Signed)
Anesthesia Evaluation  Patient identified by MRN, date of birth, ID band Patient awake    Reviewed: Allergy & Precautions, H&P , NPO status , Patient's Chart, lab work & pertinent test results, reviewed documented beta blocker date and time   Airway Mallampati: II  TM Distance: >3 FB Neck ROM: full    Dental no notable dental hx. (+) Teeth Intact   Pulmonary neg pulmonary ROS, sleep apnea , Patient abstained from smoking.,    Pulmonary exam normal breath sounds clear to auscultation       Cardiovascular Exercise Tolerance: Good hypertension, + CAD and + Past MI  negative cardio ROS   Rhythm:regular Rate:Normal     Neuro/Psych negative neurological ROS  negative psych ROS   GI/Hepatic Neg liver ROS, GERD  Medicated,  Endo/Other  negative endocrine ROSdiabetes  Renal/GU negative Renal ROS  negative genitourinary   Musculoskeletal   Abdominal   Peds  Hematology  (+) Blood dyscrasia, anemia ,   Anesthesia Other Findings   Reproductive/Obstetrics negative OB ROS                             Anesthesia Physical Anesthesia Plan  ASA: III  Anesthesia Plan: General   Post-op Pain Management:    Induction:   PONV Risk Score and Plan: 2 and Ondansetron  Airway Management Planned:   Additional Equipment:   Intra-op Plan:   Post-operative Plan:   Informed Consent: I have reviewed the patients History and Physical, chart, labs and discussed the procedure including the risks, benefits and alternatives for the proposed anesthesia with the patient or authorized representative who has indicated his/her understanding and acceptance.     Dental Advisory Given  Plan Discussed with: CRNA  Anesthesia Plan Comments:         Anesthesia Quick Evaluation

## 2020-01-31 NOTE — Discharge Instructions (Addendum)
Laparoscopic Cholecystectomy, Care After This sheet gives you information about how to care for yourself after your procedure. Your health care provider may also give you more specific instructions. If you have problems or questions, contact your health care provider. What can I expect after the procedure? After the procedure, it is common to have:  Pain at your incision sites. You will be given medicines to control this pain.  Mild nausea or vomiting.  Bloating and possible shoulder pain from the air-like gas that was used during the procedure. Follow these instructions at home: Incision care   Follow instructions from your health care provider about how to take care of your incisions. Make sure you: ? Wash your hands with soap and water before you change your bandage (dressing). If soap and water are not available, use hand sanitizer. ? Change your dressing as told by your health care provider. ? Leave stitches (sutures), skin glue, or adhesive strips in place. These skin closures may need to be in place for 2 weeks or longer. If adhesive strip edges start to loosen and curl up, you may trim the loose edges. Do not remove adhesive strips completely unless your health care provider tells you to do that.  Do not take baths, swim, or use a hot tub until your health care provider approves. Ask your health care provider if you can take showers. You may only be allowed to take sponge baths for bathing.  Check your incision area every day for signs of infection. Check for: ? More redness, swelling, or pain. ? More fluid or blood. ? Warmth. ? Pus or a bad smell. Activity  Do not drive or use heavy machinery while taking prescription pain medicine.  Do not lift anything that is heavier than 10 lb (4.5 kg) until your health care provider approves.  Do not play contact sports until your health care provider approves.  Do not drive for 24 hours if you were given a medicine to help you relax  (sedative).  Rest as needed. Do not return to work or school until your health care provider approves. General instructions  Take over-the-counter and prescription medicines only as told by your health care provider.  To prevent or treat constipation while you are taking prescription pain medicine, your health care provider may recommend that you: ? Drink enough fluid to keep your urine clear or pale yellow. ? Take over-the-counter or prescription medicines. ? Eat foods that are high in fiber, such as fresh fruits and vegetables, whole grains, and beans. ? Limit foods that are high in fat and processed sugars, such as fried and sweet foods. Contact a health care provider if:  You develop a rash.  You have more redness, swelling, or pain around your incisions.  You have more fluid or blood coming from your incisions.  Your incisions feel warm to the touch.  You have pus or a bad smell coming from your incisions.  You have a fever.  One or more of your incisions breaks open. Get help right away if:  You have trouble breathing.  You have chest pain.  You have increasing pain in your shoulders.  You faint or feel dizzy when you stand.  You have severe pain in your abdomen.  You have nausea or vomiting that lasts for more than one day.  You have leg pain. This information is not intended to replace advice given to you by your health care provider. Make sure you discuss any questions you have   with your health care provider. Document Revised: 05/08/2017 Document Reviewed: 11/12/2015 Elsevier Patient Education  2020 Elsevier Inc.  

## 2020-01-31 NOTE — Interval H&P Note (Signed)
History and Physical Interval Note:  01/31/2020 9:54 AM  Russell Morrow  has presented today for surgery, with the diagnosis of Cholelithiasis.  The various methods of treatment have been discussed with the patient and family. After consideration of risks, benefits and other options for treatment, the patient has consented to  Procedure(s): LAPAROSCOPIC CHOLECYSTECTOMY (N/A) as a surgical intervention.  The patient's history has been reviewed, patient examined, no change in status, stable for surgery.  I have reviewed the patient's chart and labs.  Questions were answered to the patient's satisfaction.     Franky Macho

## 2020-02-01 ENCOUNTER — Encounter (HOSPITAL_COMMUNITY): Payer: Self-pay | Admitting: General Surgery

## 2020-02-01 LAB — SURGICAL PATHOLOGY

## 2020-02-03 ENCOUNTER — Other Ambulatory Visit (HOSPITAL_BASED_OUTPATIENT_CLINIC_OR_DEPARTMENT_OTHER): Payer: Self-pay

## 2020-02-03 DIAGNOSIS — G4710 Hypersomnia, unspecified: Secondary | ICD-10-CM

## 2020-02-06 ENCOUNTER — Other Ambulatory Visit: Payer: Self-pay

## 2020-02-06 ENCOUNTER — Ambulatory Visit: Payer: Medicare Other | Admitting: Cardiology

## 2020-02-06 ENCOUNTER — Encounter: Payer: Self-pay | Admitting: Cardiology

## 2020-02-06 ENCOUNTER — Other Ambulatory Visit: Payer: Self-pay | Admitting: *Deleted

## 2020-02-06 VITALS — BP 152/86 | HR 90 | Ht 73.0 in | Wt 243.0 lb

## 2020-02-06 DIAGNOSIS — I10 Essential (primary) hypertension: Secondary | ICD-10-CM | POA: Diagnosis not present

## 2020-02-06 DIAGNOSIS — Z79899 Other long term (current) drug therapy: Secondary | ICD-10-CM | POA: Diagnosis not present

## 2020-02-06 DIAGNOSIS — I2510 Atherosclerotic heart disease of native coronary artery without angina pectoris: Secondary | ICD-10-CM | POA: Diagnosis not present

## 2020-02-06 MED ORDER — SPIRONOLACTONE 25 MG PO TABS: 25.0000 mg | ORAL_TABLET | Freq: Every day | ORAL | 3 refills | Status: DC

## 2020-02-06 MED ORDER — OLMESARTAN MEDOXOMIL 40 MG PO TABS: 40.0000 mg | ORAL_TABLET | Freq: Every day | ORAL | 3 refills | Status: DC

## 2020-02-06 MED ORDER — AMLODIPINE BESYLATE 10 MG PO TABS: 10.0000 mg | ORAL_TABLET | Freq: Every day | ORAL | 3 refills | Status: DC

## 2020-02-06 NOTE — Progress Notes (Signed)
Clinical Summary Russell Morrow is a 69 y.o.male seen today for follow up of the following medical problems.   1. CAD - 08/2018 cath: LM patent, ostial LAD 40%, LCX prox occlusion and thrombotic, RCA no significant idsease. S/p DES to LCX 08/2018 echo LVEF 45-50%, inferior/lateral wall hypokinesis.    11/2019 echo LVEF 60-65%, grade I DDx, indet number of AV cusps possibly bicuspid. Aortic root dilatation mild 41 mm, apical hypokinesis  - burning pain 30 min after eating. Some exertional chest pains when working in his yard. Burning/pressure mid chest, resolves. Some SOB associated with     12/2019 nuclear stress moderate inferior ischemia - recent lap chole, since then his nonspecific chest pains symptoms have resoolved.    2. LE edema - has prn lasix   3. HTN -he is compliant with meds   4. PSVT/Atach - his atach can have aberrancy with a wide complex   5. Kidney lesion   6.Hyperlipidemia - intolerant to multiple statins in the past includin atorva, crestor, prava, simva, zetia - tried crestor 5mg  once a week during last visit  - upcoming labs with pcp      7. AMS - recent admission 11/2019 From discharge summary: - "seen by Dr. 12/2019 who suspected multifactorial severe hypersomnia from OSA and RLS"  8. 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 Wife had told him he was alterered, bobbing his head. Wife stood him up to walk and fell to ground. He denies full LOC - episode in the hopsital while sitting by the window. He does not recall details.   -no recurrent episodes.   9. Hypokalemia - recurring issues, has been on HCTZ.    10. 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   Past Medical History:  Diagnosis Date  . CAD (coronary artery disease)    a. mild nonobstructive CAD by cath in 2010 b. NST in 11/2015 showing a fixed defect with no ischemia and Coronary CT showing nonobstructive CAD along LAD and LCx c. 08/2018: s/p NSTEMI with cath showing acute total occlusion of LCx (treated with DES) and nonobstructive CAD along RCA and LAD.   . Diabetes mellitus without complication (HCC)   . Diverticulitis   . Gallstones   . Hypertension   . IDA (iron deficiency anemia)   . MI (myocardial infarction) (HCC)   . PSVT (paroxysmal supraventricular tachycardia) (HCC)   . Restless leg syndrome   . Sleep apnea      Allergies  Allergen Reactions  . Statins     Muscle aches, liver pain; Tried Atorvastatin, Crestor, Pravastatin, and Simvastatin  . Zetia [Ezetimibe] Other (See Comments)    Muscle Aches     Current Outpatient Medications  Medication Sig Dispense Refill  . acetaminophen (TYLENOL) 500 MG tablet Take 1,000 mg by mouth every 6 (six) hours as needed for moderate pain.     09/2018 aspirin EC 81 MG EC tablet Take 1 tablet (81 mg total) by mouth daily.    Marland Kitchen dicyclomine (BENTYL) 10 MG capsule Take 10 mg by mouth 4 (four) times daily as needed for spasms.     Marland Kitchen esomeprazole (NEXIUM) 20 MG capsule Take 40 mg by mouth 2 (  two) times daily before a meal.     . FEROSUL 325 (65 Fe) MG tablet Take 325 mg by mouth 2 (two) times daily.     . furosemide (LASIX) 40 MG tablet TAKE AS NEEDED FOR SWELLING (Patient taking differently: Take 40 mg by mouth daily as needed for edema. ) 90 tablet 1  . ibuprofen (ADVIL) 200 MG tablet Take 200 mg by mouth every 6 (six) hours as needed for moderate pain.     Marland Kitchen insulin lispro (HUMALOG) 100 UNIT/ML injection Inject 6-12 Units into the skin 3 (three) times daily before meals.    Marland Kitchen LANTUS SOLOSTAR 100 UNIT/ML Solostar Pen Inject 35 Units into the skin 2 (two) times daily. 15 mL 11  . metFORMIN (GLUCOPHAGE) 1000 MG tablet  Take 1 tablet (1,000 mg total) by mouth 2 (two) times daily with a meal.    . metoprolol succinate (TOPROL-XL) 25 MG 24 hr tablet Take 3 tablets (75 mg total) by mouth daily. 90 tablet 5  . Olmesartan-amLODIPine-HCTZ 40-10-25 MG TABS TAKE 1 TABLET DAILY (Patient taking differently: Take 1 tablet by mouth daily. ) 90 tablet 1  . potassium chloride SA (K-DUR,KLOR-CON) 20 MEQ tablet Take 20 mEq by mouth daily.    . pregabalin (LYRICA) 75 MG capsule Take 1 capsule (75 mg total) by mouth at bedtime. 30 capsule 0  . rOPINIRole (REQUIP) 1 MG tablet Take 2 mg by mouth at bedtime.    . tadalafil (CIALIS) 10 MG tablet Take 10 mg by mouth daily as needed for erectile dysfunction.     . temazepam (RESTORIL) 15 MG capsule Take 15 mg by mouth at bedtime.    . traMADol (ULTRAM) 50 MG tablet Take 1 tablet (50 mg total) by mouth every 6 (six) hours as needed. 25 tablet 0   No current facility-administered medications for this visit.     Past Surgical History:  Procedure Laterality Date  . CERVICAL FUSION    . CHOLECYSTECTOMY N/A 01/31/2020   Procedure: LAPAROSCOPIC CHOLECYSTECTOMY;  Surgeon: Franky Macho, MD;  Location: [facility];  Service: General;  Laterality: N/A;  . CORONARY STENT INTERVENTION N/A 08/13/2018   Procedure: CORONARY STENT INTERVENTION;  Surgeon: Tonny Bollman, MD;  [facility]ASIVE CV LAB;  Service: Cardiovascular;  Laterality: N/A;  . LEFT HEART CATH AND CORONARY ANGIOGRAPHY N/A 08/13/2018   Procedure: LEFT HEART CATH AND CORONARY ANGIOGRAPHY;  Surgeon: Tonny Bollman, MD;  Location: [facility] INVASIVE CV LAB;  Service: Cardiovascular;  Laterality: N/A;  . nasal septum repair       Allergies  Allergen Reactions  . Statins     Muscle aches, liver pain; Tried Atorvastatin, Crestor, Pravastatin, and Simvastatin  . Zetia [Ezetimibe] Other (See Comments)    Muscle Aches      Family History  Problem Relation Age of Onset  . Cancer Mother        breast  . Hypertension Mother   .  Diabetes Father   . Heart disease Father   . Hypertension Father   . Diabetes Brother   . Heart disease Brother   . Hypertension Brother   . Colon cancer Neg Hx      Social History Mr. Wegner reports that he has never smoked. He has never used smokeless tobacco. Mr. Drumwright reports previous alcohol use of about 14.0 standard drinks of alcohol per week.   Review of Systems CONSTITUTIONAL: No weight loss, fever, chills, weakness or fatigue.  HEENT: Eyes: No visual loss, blurred vision, double vision or  yellow sclerae.No hearing loss, sneezing, congestion, runny nose or sore throat.  SKIN: No rash or itching.  CARDIOVASCULAR: per hpi RESPIRATORY: No shortness of breath, cough or sputum.  GASTROINTESTINAL: No anorexia, nausea, vomiting or diarrhea. No abdominal pain or blood.  GENITOURINARY: No burning on urination, no polyuria NEUROLOGICAL: No headache, dizziness, syncope, paralysis, ataxia, numbness or tingling in the extremities. No change in bowel or bladder control.  MUSCULOSKELETAL: No muscle, back pain, joint pain or stiffness.  LYMPHATICS: No enlarged nodes. No history of splenectomy.  PSYCHIATRIC: No history of depression or anxiety.  ENDOCRINOLOGIC: No reports of sweating, cold or heat intolerance. No polyuria or polydipsia.  Marland Kitchen   Physical Examination Today's Vitals   02/06/20 1341  BP: (!) 152/86  Pulse: 90  SpO2: 97%  Weight: 243 lb (110.2 kg)  Height: 6\' 1"  (1.854 m)   Body mass index is 32.06 kg/m.  Gen: resting comfortably, no acute distress HEENT: no scleral icterus, pupils equal round and reactive, no palptable cervical adenopathy,  CV: RRR, no m/r/g, no jvd Resp: Clear to auscultation bilaterally GI: abdomen is soft, non-tender, non-distended, normal bowel sounds, no hepatosplenomegaly MSK: extremities are warm, no edema.  Skin: warm, no rash Neuro:  no focal deficits Psych: appropriate affect   Diagnostic Studies   12/2019 nuclear stress  There was  no ST segment deviation noted during stress.  Findings consistent with prior inferior/inferoapical myocardial infarction with moderate peri-infarct ischemia.  This is an intermediate risk study.  The left ventricular ejection fraction is moderately decreased (30-44%).   11/2019 echo 1. There is mild apical hypokinesis. . Left ventricular ejection  fraction, by estimation, is 60 to 65%. The left ventricle has normal  function. The left ventricle demonstrates regional wall motion  abnormalities (see scoring diagram/findings for  description). There is moderate left ventricular hypertrophy. Left  ventricular diastolic parameters are consistent with Grade I diastolic  dysfunction (impaired relaxation).  2. Right ventricular systolic function is normal. The right ventricular  size is normal.  3. Left atrial size was mildly dilated.  4. The mitral valve is normal in structure. Trivial mitral valve  regurgitation. No evidence of mitral stenosis.  5. Indeterminant number of aortic valve cusps, possibly bicuspid. . The  aortic valve has an indeterminant number of cusps. Aortic valve  regurgitation is not visualized. No aortic stenosis is present.  6. Aortic dilatation noted. There is mild dilatation of the aortic root  measuring 41 mm.  7. The inferior vena cava is dilated in size with >50% respiratory  variability, suggesting right atrial pressure of 8 mmHg.   Assessment and Plan   1. CAD - recent nonspecific chest pains have resolved since his lap chole - he did have a nulcear stress with moderate inferior ischemia. Continue medical therapy at this time in absence of symptoms.   2. HTN - recurrent issues with low K - stop his olmesarta/amlodopine/HCTZ combination - start back olmesartan and amlodopine separately, start aldactone 25mg  daily. Check BMET 2 weeks   3. Hyperlipidemia - intolerant to statins - f/u upcoming pcp labs, may consider zetia vs repatha     12/2019, M.D

## 2020-02-06 NOTE — Patient Outreach (Signed)
[facility]) Care Management  02/06/2020  Russell Morrow [birth year] [id number]  Telephone outreach to follow up on pt post cholecystectomy:  Left message for pt to return my call.  [name]. [name], MSN, [facility] Gerontological Nurse Practitioner [facility] Care Management [phone]

## 2020-02-06 NOTE — Patient Instructions (Addendum)
Medication Instructions:  STOP   Olmesartan-Amlodipine-HCT    START Olmesartan 40 mg daily   START Amlodipine 10 mg daily   START Aldactone 25 mg daily    ONLY TAKE POTASSIUM WHEN YOU TAKE LASIX    *If you need a refill on your cardiac medications before your next appointment, please call your pharmacy*   Lab Work: In 2 weeks , get labs: BMET   If you have labs (blood work) drawn today and your tests are completely normal, you will receive your results only by: Marland Kitchen MyChart Message (if you have MyChart) OR . A paper copy in the mail If you have any lab test that is abnormal or we need to change your treatment, we will call you to review the results.   Testing/Procedures: None today   Follow-Up: At [facility], you and your health needs are our priority.  As part of our continuing mission to provide you with exceptional heart care, we have created designated Provider Care Teams.  These Care Teams include your primary Cardiologist (physician) and Advanced Practice Providers (APPs -  Physician Assistants and Nurse Practitioners) who all work together to provide you with the care you need, when you need it.  We recommend signing up for the patient portal called "MyChart".  Sign up information is provided on this After Visit Summary.  MyChart is used to connect with patients for Virtual Visits (Telemedicine).  Patients are able to view lab/test results, encounter notes, upcoming appointments, etc.  Non-urgent messages can be sent to your provider as well.   To learn more about what you can do with MyChart, go to [email or web address].    Your next appointment:   4 month(s)  The format for your next appointment:   In Person  Provider:   Dina Rich, MD   Other Instructions None      Thank you for choosing [facility] !

## 2020-02-07 DIAGNOSIS — E1169 Type 2 diabetes mellitus with other specified complication: Secondary | ICD-10-CM | POA: Diagnosis not present

## 2020-02-07 DIAGNOSIS — R112 Nausea with vomiting, unspecified: Secondary | ICD-10-CM | POA: Diagnosis not present

## 2020-02-07 DIAGNOSIS — I10 Essential (primary) hypertension: Secondary | ICD-10-CM | POA: Diagnosis not present

## 2020-02-07 DIAGNOSIS — E782 Mixed hyperlipidemia: Secondary | ICD-10-CM | POA: Diagnosis not present

## 2020-02-09 DIAGNOSIS — I10 Essential (primary) hypertension: Secondary | ICD-10-CM | POA: Diagnosis not present

## 2020-02-09 DIAGNOSIS — R112 Nausea with vomiting, unspecified: Secondary | ICD-10-CM | POA: Diagnosis not present

## 2020-02-09 DIAGNOSIS — E782 Mixed hyperlipidemia: Secondary | ICD-10-CM | POA: Diagnosis not present

## 2020-02-09 DIAGNOSIS — E1169 Type 2 diabetes mellitus with other specified complication: Secondary | ICD-10-CM | POA: Diagnosis not present

## 2020-02-10 ENCOUNTER — Other Ambulatory Visit: Payer: Self-pay | Admitting: *Deleted

## 2020-02-10 NOTE — Patient Outreach (Signed)
[facility]) Care Management  02/10/2020  BILAL MANZER 1951/04/11 [id number]  Second outreach to follow up s/p cholecystectomy.  No answer, left a message and requested a return call.  Will send unsuccessful letter.  Next regularly scheduled visit for chronic illness management 03/14/20.  [name]. [name], MSN, [facility] Gerontological Nurse Practitioner [facility] Care Management [phone]

## 2020-02-14 ENCOUNTER — Telehealth (INDEPENDENT_AMBULATORY_CARE_PROVIDER_SITE_OTHER): Payer: Medicare Other | Admitting: General Surgery

## 2020-02-14 ENCOUNTER — Encounter: Payer: Self-pay | Admitting: *Deleted

## 2020-02-14 DIAGNOSIS — Z09 Encounter for follow-up examination after completed treatment for conditions other than malignant neoplasm: Secondary | ICD-10-CM

## 2020-02-14 NOTE — Progress Notes (Signed)
Subjective:     Russell Morrow  Virtual telephone postoperative visit performed.  Patient doing well.  He states approximately 85% of his preoperative symptoms have resolved.  He is pleased with the results. Objective:    There were no vitals taken for this visit.  General:  alert, cooperative and no distress  Final pathology consistent with diagnosis     Assessment:    Doing well postoperatively.    Plan:   Follow-up as needed.  Total telephone time 2 minutes.

## 2020-02-27 DIAGNOSIS — R141 Gas pain: Secondary | ICD-10-CM | POA: Diagnosis not present

## 2020-02-27 DIAGNOSIS — Z712 Person consulting for explanation of examination or test findings: Secondary | ICD-10-CM | POA: Diagnosis not present

## 2020-02-27 DIAGNOSIS — E876 Hypokalemia: Secondary | ICD-10-CM | POA: Diagnosis not present

## 2020-02-27 DIAGNOSIS — L723 Sebaceous cyst: Secondary | ICD-10-CM | POA: Diagnosis not present

## 2020-02-27 DIAGNOSIS — K8020 Calculus of gallbladder without cholecystitis without obstruction: Secondary | ICD-10-CM | POA: Diagnosis not present

## 2020-02-27 DIAGNOSIS — E1169 Type 2 diabetes mellitus with other specified complication: Secondary | ICD-10-CM | POA: Diagnosis not present

## 2020-03-01 DIAGNOSIS — E1169 Type 2 diabetes mellitus with other specified complication: Secondary | ICD-10-CM | POA: Diagnosis not present

## 2020-03-01 DIAGNOSIS — I2510 Atherosclerotic heart disease of native coronary artery without angina pectoris: Secondary | ICD-10-CM | POA: Diagnosis not present

## 2020-03-01 DIAGNOSIS — I10 Essential (primary) hypertension: Secondary | ICD-10-CM | POA: Diagnosis not present

## 2020-03-01 DIAGNOSIS — E782 Mixed hyperlipidemia: Secondary | ICD-10-CM | POA: Diagnosis not present

## 2020-03-14 ENCOUNTER — Other Ambulatory Visit: Payer: Self-pay | Admitting: *Deleted

## 2020-03-14 NOTE — Patient Outreach (Addendum)
[facility]) Care Management  03/14/2020  Russell Morrow 10-Jan-1951 [id number]   Telephone outreach and follow up on GI post cholecystectomy and DM control, wt.  [name] has totally recovered from his surgery. He says his appetite is better than it should be. Current wt 243. Wt before 230. He is 6'2".   Current glucose values: fbs generally under 150, pm #s around 180. Has had several hypoglycemic middle of the night episodes with levels in 50s. Last A1C was 8.1 in August.  Set goal for wt loss to 230 over next 6 mos. Also want to see A1C moving closer to goal of 7.0. Previously provided ALL ABOUT CARBS and encouraged him to follow these principles and be more active to achieve these goals.  We will talk again in 3 months.  Sending Dr. Margo Aye my note, DM meds need adjustment to avoid night time hypoglycemia.  [name]. [name], MSN, [facility] Gerontological Nurse Practitioner [facility] Care Management [phone]

## 2020-03-19 DIAGNOSIS — E782 Mixed hyperlipidemia: Secondary | ICD-10-CM | POA: Diagnosis not present

## 2020-03-19 DIAGNOSIS — E1169 Type 2 diabetes mellitus with other specified complication: Secondary | ICD-10-CM | POA: Diagnosis not present

## 2020-03-19 DIAGNOSIS — R112 Nausea with vomiting, unspecified: Secondary | ICD-10-CM | POA: Diagnosis not present

## 2020-03-19 DIAGNOSIS — I10 Essential (primary) hypertension: Secondary | ICD-10-CM | POA: Diagnosis not present

## 2020-03-22 ENCOUNTER — Other Ambulatory Visit: Payer: Self-pay

## 2020-03-22 NOTE — Patient Outreach (Signed)
[facility]) Care Management  03/22/2020  Russell Morrow 30-Sep-1950 [id number]   Called patient to complete satisfaction survey. Left message to call back.  Domingo Cocking [facility] Care Management Assistant [phone]

## 2020-04-16 DIAGNOSIS — R112 Nausea with vomiting, unspecified: Secondary | ICD-10-CM | POA: Diagnosis not present

## 2020-04-16 DIAGNOSIS — E782 Mixed hyperlipidemia: Secondary | ICD-10-CM | POA: Diagnosis not present

## 2020-04-16 DIAGNOSIS — E1169 Type 2 diabetes mellitus with other specified complication: Secondary | ICD-10-CM | POA: Diagnosis not present

## 2020-04-16 DIAGNOSIS — I10 Essential (primary) hypertension: Secondary | ICD-10-CM | POA: Diagnosis not present

## 2020-04-27 ENCOUNTER — Telehealth: Payer: Self-pay | Admitting: Gastroenterology

## 2020-04-27 DIAGNOSIS — N289 Disorder of kidney and ureter, unspecified: Secondary | ICD-10-CM

## 2020-04-27 DIAGNOSIS — N281 Cyst of kidney, acquired: Secondary | ICD-10-CM

## 2020-04-27 NOTE — Telephone Encounter (Signed)
Please let pt know that we attempted several times to get records from [facility].   Never able to get prior EGD/colonoscopy reports.  As far as renal lesions, never able to get MRI reports that he said were available. We have copy of CTs (2) from 2014 that showed lesion on the right kidney but he now has one on the left kidney that was not there in 2014.   We last CTs this year, radiologists have been concerned for possible renal cell carcinoma. They have advised MRI. He previously declined.   PLEASE ASK PT IF HE WANTS TO PURSUE MRI AS REQUESTED TO RULE OUT KIDNEY CANCER.  HE ALSO DID NOT GET SCHEDULED FOR TCS/EGD FOR IDA ((HE DID NOT RETURN CALL). WOULD OFFER RETURN OV IF HE PLANS TO PURSUE. OTHERWISE HE CAN FOLLOW UP WITH PCP FOR HIS ANEMIA.

## 2020-04-30 NOTE — Telephone Encounter (Signed)
Spoke with pt. Pt was notified that his reports weren't able to be retrieved. Pt is ok with doing MRI and didn't call back on the TCS/EGD due to having gallbladder surgery around that time. Pt is now able to complete EGD/TCS as well.

## 2020-04-30 NOTE — Telephone Encounter (Signed)
Lmom, waiting on a return call.  

## 2020-05-01 ENCOUNTER — Encounter: Payer: Self-pay | Admitting: Gastroenterology

## 2020-05-01 ENCOUNTER — Other Ambulatory Visit: Payer: Self-pay

## 2020-05-01 DIAGNOSIS — D509 Iron deficiency anemia, unspecified: Secondary | ICD-10-CM

## 2020-05-01 NOTE — Telephone Encounter (Signed)
PATIENT SCHEDULED  °

## 2020-05-01 NOTE — Telephone Encounter (Addendum)
Mary at [facility] MRI dept advised pt would need MRI abd with and without contrast for indeterminate kidney lesions.  MRI scheduled for 05/14/20 at 12:00pm, arrive at 11:30am. NPO 4 hours prior to test.  Called and informed pt of MRI appt. Letter mailed.  No PA needed for MRI per [facility] website.

## 2020-05-01 NOTE — Addendum Note (Signed)
Addended by: Corrie Mckusick on: 05/01/2020 01:47 PM   Modules accepted: Orders

## 2020-05-01 NOTE — Telephone Encounter (Signed)
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   Update labs before MRI: basic met panel, CBC, iron/tibc/ferritin.  Make OV to follow MRI to discuss results and schedule for tcs+/-EGD if appropriate.

## 2020-05-01 NOTE — Telephone Encounter (Signed)
Noted. Lab orders placed. [facility] please arrange MRI. Misty Stanley please arrange follow up apt after MRI.

## 2020-05-01 NOTE — Telephone Encounter (Signed)
Russell Morrow, FYI MRI abdomen with and without contrast was ordered per [name] at [facility] radiology.

## 2020-05-02 NOTE — Telephone Encounter (Signed)
Noted  

## 2020-05-14 ENCOUNTER — Other Ambulatory Visit: Payer: Self-pay

## 2020-05-14 ENCOUNTER — Ambulatory Visit (HOSPITAL_COMMUNITY)
Admission: RE | Admit: 2020-05-14 | Discharge: 2020-05-14 | Disposition: A | Discharge status: Home / Self Care | Payer: Medicare Other | Source: Ambulatory Visit | Attending: Gastroenterology | Admitting: Gastroenterology

## 2020-05-14 DIAGNOSIS — N289 Disorder of kidney and ureter, unspecified: Secondary | ICD-10-CM | POA: Diagnosis not present

## 2020-05-14 DIAGNOSIS — N281 Cyst of kidney, acquired: Secondary | ICD-10-CM | POA: Diagnosis not present

## 2020-05-14 DIAGNOSIS — K760 Fatty (change of) liver, not elsewhere classified: Secondary | ICD-10-CM | POA: Diagnosis not present

## 2020-05-14 MED ORDER — GADOBUTROL 1 MMOL/ML IV SOLN
10.0000 mL | Freq: Once | INTRAVENOUS | Status: AC | PRN
  Administered 2020-05-14: 10 mL via INTRAVENOUS

## 2020-06-07 DIAGNOSIS — L723 Sebaceous cyst: Secondary | ICD-10-CM | POA: Diagnosis not present

## 2020-06-07 DIAGNOSIS — K8020 Calculus of gallbladder without cholecystitis without obstruction: Secondary | ICD-10-CM | POA: Diagnosis not present

## 2020-06-07 DIAGNOSIS — E876 Hypokalemia: Secondary | ICD-10-CM | POA: Diagnosis not present

## 2020-06-07 DIAGNOSIS — E1169 Type 2 diabetes mellitus with other specified complication: Secondary | ICD-10-CM | POA: Diagnosis not present

## 2020-06-07 DIAGNOSIS — Z712 Person consulting for explanation of examination or test findings: Secondary | ICD-10-CM | POA: Diagnosis not present

## 2020-06-07 DIAGNOSIS — Z129 Encounter for screening for malignant neoplasm, site unspecified: Secondary | ICD-10-CM | POA: Diagnosis not present

## 2020-06-08 DIAGNOSIS — R112 Nausea with vomiting, unspecified: Secondary | ICD-10-CM | POA: Diagnosis not present

## 2020-06-08 DIAGNOSIS — I2510 Atherosclerotic heart disease of native coronary artery without angina pectoris: Secondary | ICD-10-CM | POA: Diagnosis not present

## 2020-06-08 DIAGNOSIS — E1169 Type 2 diabetes mellitus with other specified complication: Secondary | ICD-10-CM | POA: Diagnosis not present

## 2020-06-08 DIAGNOSIS — I10 Essential (primary) hypertension: Secondary | ICD-10-CM | POA: Diagnosis not present

## 2020-06-08 DIAGNOSIS — E782 Mixed hyperlipidemia: Secondary | ICD-10-CM | POA: Diagnosis not present

## 2020-06-12 DIAGNOSIS — Z8719 Personal history of other diseases of the digestive system: Secondary | ICD-10-CM | POA: Diagnosis not present

## 2020-06-12 DIAGNOSIS — M1990 Unspecified osteoarthritis, unspecified site: Secondary | ICD-10-CM | POA: Diagnosis not present

## 2020-06-12 DIAGNOSIS — G4700 Insomnia, unspecified: Secondary | ICD-10-CM | POA: Diagnosis not present

## 2020-06-12 DIAGNOSIS — E782 Mixed hyperlipidemia: Secondary | ICD-10-CM | POA: Diagnosis not present

## 2020-06-12 DIAGNOSIS — Z23 Encounter for immunization: Secondary | ICD-10-CM | POA: Diagnosis not present

## 2020-06-12 DIAGNOSIS — E1165 Type 2 diabetes mellitus with hyperglycemia: Secondary | ICD-10-CM | POA: Diagnosis not present

## 2020-06-12 DIAGNOSIS — M791 Myalgia, unspecified site: Secondary | ICD-10-CM | POA: Diagnosis not present

## 2020-06-12 DIAGNOSIS — G2581 Restless legs syndrome: Secondary | ICD-10-CM | POA: Diagnosis not present

## 2020-06-12 DIAGNOSIS — I10 Essential (primary) hypertension: Secondary | ICD-10-CM | POA: Diagnosis not present

## 2020-06-12 DIAGNOSIS — I2510 Atherosclerotic heart disease of native coronary artery without angina pectoris: Secondary | ICD-10-CM | POA: Diagnosis not present

## 2020-06-12 DIAGNOSIS — E1169 Type 2 diabetes mellitus with other specified complication: Secondary | ICD-10-CM | POA: Diagnosis not present

## 2020-06-13 ENCOUNTER — Other Ambulatory Visit: Payer: Self-pay | Admitting: *Deleted

## 2020-06-13 NOTE — Patient Outreach (Signed)
[facility]) Care Management  06/13/2020  Russell Morrow [birth year] [id number]  Telephone outreach for follow up DM, GI, Wt loss, eye and foot exams.  Unsuccessful, left message and requested a return call.  [name]. [name], MSN, [facility] Gerontological Nurse Practitioner [facility] Care Management [phone]

## 2020-06-18 ENCOUNTER — Ambulatory Visit: Payer: Medicare Other | Admitting: Cardiology

## 2020-06-18 ENCOUNTER — Other Ambulatory Visit: Payer: Self-pay

## 2020-06-18 ENCOUNTER — Encounter: Payer: Self-pay | Admitting: Cardiology

## 2020-06-18 VITALS — BP 152/78 | HR 77 | Ht 73.0 in | Wt 264.0 lb

## 2020-06-18 DIAGNOSIS — I2510 Atherosclerotic heart disease of native coronary artery without angina pectoris: Secondary | ICD-10-CM | POA: Diagnosis not present

## 2020-06-18 DIAGNOSIS — Z79899 Other long term (current) drug therapy: Secondary | ICD-10-CM

## 2020-06-18 DIAGNOSIS — R0602 Shortness of breath: Secondary | ICD-10-CM

## 2020-06-18 DIAGNOSIS — R600 Localized edema: Secondary | ICD-10-CM

## 2020-06-18 MED ORDER — POTASSIUM CHLORIDE CRYS ER 20 MEQ PO TBCR: 20.0000 meq | EXTENDED_RELEASE_TABLET | Freq: Every day | ORAL | 3 refills | Status: DC

## 2020-06-18 MED ORDER — FUROSEMIDE 40 MG PO TABS: 40.0000 mg | ORAL_TABLET | Freq: Two times a day (BID) | ORAL | 3 refills | Status: DC

## 2020-06-18 NOTE — Patient Instructions (Signed)
Medication Instructions:  INCREASE Lasix to 40 mg twice a day  *If you need a refill on your cardiac medications before your next appointment, please call your pharmacy*   Lab Work: BMET, Magnesium in 1 week  If you have labs (blood work) drawn today and your tests are completely normal, you will receive your results only by: Marland Kitchen MyChart Message (if you have MyChart) OR . A paper copy in the mail If you have any lab test that is abnormal or we need to change your treatment, we will call you to review the results.   Testing/Procedures: Your physician has requested that you have an echocardiogram. Echocardiography is a painless test that uses sound waves to create images of your heart. It provides your doctor with information about the size and shape of your heart and how well your heart's chambers and valves are working. This procedure takes approximately one hour. There are no restrictions for this procedure.     Follow-Up: At [facility], you and your health needs are our priority.  As part of our continuing mission to provide you with exceptional heart care, we have created designated Provider Care Teams.  These Care Teams include your primary Cardiologist (physician) and Advanced Practice Providers (APPs -  Physician Assistants and Nurse Practitioners) who all work together to provide you with the care you need, when you need it.  We recommend signing up for the patient portal called "MyChart".  Sign up information is provided on this After Visit Summary.  MyChart is used to connect with patients for Virtual Visits (Telemedicine).  Patients are able to view lab/test results, encounter notes, upcoming appointments, etc.  Non-urgent messages can be sent to your provider as well.   To learn more about what you can do with MyChart, go to [email or web address].    Your next appointment:   3 week(s)  The format for your next appointment:   In Person  Provider:   You will see one of  the following Advanced Practice Providers on your designated Care Team:    Randall An, PA-C   Jacolyn Reedy, PA-C     Thank you for choosing [facility] !           Other Instructions None

## 2020-06-18 NOTE — Progress Notes (Signed)
Clinical Summary Mr. Serviss is a 70 y.o.male seen today for follow up of the following medical problems.   1. CAD - 08/2018 cath: LM patent, ostial LAD 40%, LCX prox occlusion and thrombotic, RCA no significant idsease. S/p DES to LCX 08/2018 echo LVEF 45-50%, inferior/lateral wall hypokinesis.   11/2019 echo LVEF 60-65%, grade I DDx, indet number of AV cusps possibly bicuspid. Aortic root dilatation mild 41 mm, apical hypokinesis    12/2019 nuclear stress moderate inferior ischemia - after lap chole prior chest pains resolved  - no recent chest pain, some SOB - SOB started about 1 year ago. SOB can occur at rest or with activity - weight is up 21 lbs since 01/2020 - has had some LE edema increasing over the last 6 months - started taking lasix 40mg  in AM and 20mg  x1 week.  - worst swelling he has had    2. LE edema - has increased significantly recently, some associated SOB   3. HTN -compliant with meds   4. PSVT/Atach - his atach can have aberrancy with a wide complex - no recent symptoms  5. Kidney lesion   6.Hyperlipidemia - intolerant to multiple statins in the past includin atorva, crestor, prava, simva - tried crestor 5mg  once a week during last visit. Now off - he is on zetia  05/2020 TC 151 TG 89 HDL 41 LDL 93, down from 102.     7. AMS - recent admission 11/2019 From discharge summary:-"seen by Dr. 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 Wife had told him he was alterered, bobbing his head. Wife stood him up to walk and fell to ground. He denies full LOC - episode in the hopsital while sitting by the window. He does not recall details. - no recnet troubles     9. 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    Past Medical History:  Diagnosis Date  . CAD (coronary artery disease)    a. mild nonobstructive CAD by cath in 2010 b. NST in 11/2015 showing a fixed defect with no ischemia and Coronary CT showing nonobstructive CAD along LAD and LCx c. 08/2018: s/p NSTEMI with cath showing acute total occlusion of LCx (treated with DES) and nonobstructive CAD along RCA and LAD.   . Diabetes mellitus without complication (HCC)   . Diverticulitis   . Gallstones   . Hypertension   . IDA (iron deficiency anemia)   . MI (myocardial infarction) (HCC)   . PSVT (paroxysmal supraventricular tachycardia) (HCC)   . Restless leg syndrome   . Sleep apnea      Allergies  Allergen Reactions  . Statins     Muscle aches, liver pain; Tried Atorvastatin, Crestor, Pravastatin, and Simvastatin  . Zetia [Ezetimibe] Other (See Comments)    Muscle Aches     Current Outpatient Medications  Medication Sig Dispense Refill  . acetaminophen (TYLENOL) 500 MG tablet Take 1,000 mg by mouth every 6 (six) hours as needed for moderate pain.     2011 amLODipine (NORVASC) 10 MG tablet Take 1 tablet (10 mg total) by mouth daily. 90 tablet 3  . aspirin  EC 81 MG EC tablet Take 1 tablet (81 mg total) by mouth daily.    Marland Kitchen dicyclomine (BENTYL) 10 MG capsule Take 10 mg by mouth 4 (four) times daily as needed for spasms.     Marland Kitchen esomeprazole (NEXIUM) 20 MG capsule Take 40 mg by mouth 2 (two) times daily before a meal.     . FEROSUL 325 (65 Fe) MG tablet Take 325 mg by mouth 2 (two) times daily.     . furosemide (LASIX) 40 MG tablet TAKE AS NEEDED FOR SWELLING (Patient taking differently: Take 40 mg by mouth daily as needed for edema. ) 90 tablet 1  . ibuprofen (ADVIL) 200 MG tablet Take 200 mg by mouth every 6 (six) hours as needed for moderate pain.     Marland Kitchen insulin lispro (HUMALOG) 100 UNIT/ML  injection Inject 6-12 Units into the skin 3 (three) times daily before meals.    Marland Kitchen LANTUS SOLOSTAR 100 UNIT/ML Solostar Pen Inject 35 Units into the skin 2 (two) times daily. 15 mL 11  . metFORMIN (GLUCOPHAGE) 1000 MG tablet Take 1 tablet (1,000 mg total) by mouth 2 (two) times daily with a meal.    . metoprolol succinate (TOPROL-XL) 25 MG 24 hr tablet Take 3 tablets (75 mg total) by mouth daily. 90 tablet 5  . olmesartan (BENICAR) 40 MG tablet Take 1 tablet (40 mg total) by mouth daily. 90 tablet 3  . potassium chloride SA (K-DUR,KLOR-CON) 20 MEQ tablet Take 20 mEq by mouth as needed for other. Only take with lasix    . pregabalin (LYRICA) 75 MG capsule Take 1 capsule (75 mg total) by mouth at bedtime. 30 capsule 0  . rOPINIRole (REQUIP) 1 MG tablet Take 2 mg by mouth at bedtime.    Marland Kitchen spironolactone (ALDACTONE) 25 MG tablet Take 1 tablet (25 mg total) by mouth daily. 90 tablet 3  . tadalafil (CIALIS) 10 MG tablet Take 10 mg by mouth daily as needed for erectile dysfunction.     . temazepam (RESTORIL) 15 MG capsule Take 15 mg by mouth at bedtime.    . traMADol (ULTRAM) 50 MG tablet Take 1 tablet (50 mg total) by mouth every 6 (six) hours as needed. 25 tablet 0   No current facility-administered medications for this visit.     Past Surgical History:  Procedure Laterality Date  . CERVICAL FUSION    . CHOLECYSTECTOMY N/A 01/31/2020   Procedure: LAPAROSCOPIC CHOLECYSTECTOMY;  Surgeon: Franky Macho, MD;  Location: [facility];  Service: General;  Laterality: N/A;  . CORONARY STENT INTERVENTION N/A 08/13/2018   Procedure: CORONARY STENT INTERVENTION;  Surgeon: Tonny Bollman, MD;  Location: [facility] INVASIVE CV LAB;  Service: Cardiovascular;  Laterality: N/A;  . LEFT HEART CATH AND CORONARY ANGIOGRAPHY N/A 08/13/2018   Procedure: LEFT HEART CATH AND CORONARY ANGIOGRAPHY;  Surgeon: Tonny Bollman, MD;  Location: [facility] INVASIVE CV LAB;  Service: Cardiovascular;  Laterality: N/A;  . nasal septum repair        Allergies  Allergen Reactions  . Statins     Muscle aches, liver pain; Tried Atorvastatin, Crestor, Pravastatin, and Simvastatin  . Zetia [Ezetimibe] Other (See Comments)    Muscle Aches      Family History  Problem Relation Age of Onset  . Cancer Mother        breast  . Hypertension Mother   . Diabetes Father   . Heart disease Father   . Hypertension Father   . Diabetes Brother   . Heart disease  Brother   . Hypertension Brother   . Colon cancer Neg Hx      Social History Mr. Salguero reports that he has never smoked. He has never used smokeless tobacco. Mr. Fees reports previous alcohol use of about 14.0 standard drinks of alcohol per week.   Review of Systems CONSTITUTIONAL: No weight loss, fever, chills, weakness or fatigue.  HEENT: Eyes: No visual loss, blurred vision, double vision or yellow sclerae.No hearing loss, sneezing, congestion, runny nose or sore throat.  SKIN: No rash or itching.  CARDIOVASCULAR: per hpi RESPIRATORY: No shortness of breath, cough or sputum.  GASTROINTESTINAL: No anorexia, nausea, vomiting or diarrhea. No abdominal pain or blood.  GENITOURINARY: No burning on urination, no polyuria NEUROLOGICAL: No headache, dizziness, syncope, paralysis, ataxia, numbness or tingling in the extremities. No change in bowel or bladder control.  MUSCULOSKELETAL: No muscle, back pain, joint pain or stiffness.  LYMPHATICS: No enlarged nodes. No history of splenectomy.  PSYCHIATRIC: No history of depression or anxiety.  ENDOCRINOLOGIC: No reports of sweating, cold or heat intolerance. No polyuria or polydipsia.  Marland Kitchen   Physical Examination Today's Vitals   06/18/20 1407  BP: (!) 152/78  Pulse: 77  SpO2: 96%  Weight: 264 lb (119.7 kg)  Height: 6\' 1"  (1.854 m)   Body mass index is 34.83 kg/m.  Gen: resting comfortably, no acute distress HEENT: no scleral icterus, pupils equal round and reactive, no palptable cervical adenopathy,  CV: RRR, no m/rg,  no jvd Resp: Clear to auscultation bilaterally GI: abdomen is soft, non-tender, non-distended, normal bowel sounds, no hepatosplenomegaly MSK: extremities are warm, 2+ bilateral LE edema  Skin: warm, no rash Neuro:  no focal deficits Psych: appropriate affect   Diagnostic Studies  12/2019 nuclear stress  There was no ST segment deviation noted during stress.  Findings consistent with prior inferior/inferoapical myocardial infarction with moderate peri-infarct ischemia.  This is an intermediate risk study.  The left ventricular ejection fraction is moderately decreased (30-44%).   11/2019 echo 1. There is mild apical hypokinesis. . Left ventricular ejection  fraction, by estimation, is 60 to 65%. The left ventricle has normal  function. The left ventricle demonstrates regional wall motion  abnormalities (see scoring diagram/findings for  description). There is moderate left ventricular hypertrophy. Left  ventricular diastolic parameters are consistent with Grade I diastolic  dysfunction (impaired relaxation).  2. Right ventricular systolic function is normal. The right ventricular  size is normal.  3. Left atrial size was mildly dilated.  4. The mitral valve is normal in structure. Trivial mitral valve  regurgitation. No evidence of mitral stenosis.  5. Indeterminant number of aortic valve cusps, possibly bicuspid. . The  aortic valve has an indeterminant number of cusps. Aortic valve  regurgitation is not visualized. No aortic stenosis is present.  6. Aortic dilatation noted. There is mild dilatation of the aortic root  measuring 41 mm.  7. The inferior vena cava is dilated in size with >50% respiratory  variability, suggesting right atrial pressure of 8 mmHg.    Assessment and Plan  1. CAD -no recent symptmos, continue current meds  2.LE edema - significant increase recently, some increased SOB/DOE - increase lasix to 40mg  bid, check BMET/Mg in 1 week -  repeeat echo   3. Hyperlipidemia - intolerant to statins -he is on zetia LDL in the 90s, reasonable at this time though may consider pcsk9 inhibitor if uptrend in the future.    F/u 3 week reassess volume status.  Arnoldo Lenis, M.D.

## 2020-06-19 ENCOUNTER — Other Ambulatory Visit: Payer: Self-pay | Admitting: *Deleted

## 2020-06-19 NOTE — Patient Outreach (Signed)
[facility]) Care Management  06/19/2020  Russell Morrow 11-Jan-1951 [id number]  Telephone outreach for follow up on DM, diet and wt mgmt.  Goals Addressed            This Visit's Progress   . Achieve a Healthy Weight   Not on track    Follow Up Date 07/24/20.  Taking small steps to change how you eat and exercise is a good place to start. Review ALL ABOUT CARBS and make small changes in choices with goal of 45 Gms per meal. 06/19/20 Pt reports he is eating a healthy diet. He has the information NP sent and has reviewed it. Wt is up 20# since October, thought to be fluid wt. Has been to see cardiology and furosemide and potassium have been added to his regimen. He will f/u in 2 weeks.       . Monitor and Manage My Blood Sugar   On track    Follow Up Date 07/24/20 Daily glucose monitoriing ongoing. Notes: Currently monitoring daily and prn. 06/19/20 FBS levels near 200 and before meals up to 250. Stopped metformin for a trial and his GI problems he was having have resolved. He is only taking insulin which he has had to increase his short acting with meals. Dr. Margo Aye is trying to decide on new medication that will help and not be a financial burden.    . Set My Target A1C   On track    Follow Up Date 07/24/20 Target A1C is 7.0 Notes: Last A1C in Aug was 8.1. 06/19/20 recent HgbA1C was 7.8! Moving in the right direction!    . Set My Weight Loss Goal   Not on track    Follow Up Date 07/24/20 Goal: 230 by 09/14/20.  06/19/20 [name] has had some changes in his medication regimen which has affected is ability to move towards this goal. He has actually gained 20# since we last talked in October. His current wt is 265. We discussed that the goal is not realistic and will reduce the goal to 10# by 09/14/20. His primary care MD should be getting back to him about adding another diabetic medication that may help. He is on Humalog and Lantus only at present. He is aware of how he should be  eating and does not eat too many sweets. He saw his cardiologist who started him on furosemide and potassium. He will follow up in 2 weeks with cardiology, one month with primary care.       We agreed to talk again in 1 month.  [name]. [name], MSN, [facility] Gerontological Nurse Practitioner [facility] Care Management [phone]

## 2020-06-20 ENCOUNTER — Other Ambulatory Visit: Payer: Self-pay

## 2020-06-20 ENCOUNTER — Ambulatory Visit (HOSPITAL_COMMUNITY)
Admission: RE | Admit: 2020-06-20 | Discharge: 2020-06-20 | Disposition: A | Discharge status: Home / Self Care | Payer: Medicare Other | Source: Ambulatory Visit | Attending: Cardiology | Admitting: Cardiology

## 2020-06-20 ENCOUNTER — Ambulatory Visit: Payer: Medicare Other | Admitting: Cardiology

## 2020-06-20 DIAGNOSIS — R0602 Shortness of breath: Secondary | ICD-10-CM | POA: Insufficient documentation

## 2020-06-20 LAB — ECHOCARDIOGRAM COMPLETE
AR max vel: 2.45 cm2
AV Area VTI: 2.54 cm2
AV Area mean vel: 2.58 cm2
AV Mean grad: 5.1 mmHg
AV Peak grad: 10.6 mmHg
Ao pk vel: 1.63 m/s
Area-P 1/2: 2.33 cm2
MV M vel: 4.67 m/s
MV Peak grad: 87.2 mmHg
S' Lateral: 3.62 cm

## 2020-06-20 NOTE — Progress Notes (Signed)
*  PRELIMINARY RESULTS* Echocardiogram 2D Echocardiogram has been performed. Patient blood pressure 196/116 and 204/113. Per patient meds not kicked in yet. Spoke with Randall An, PA, okay for patient to leave. Check pressure at home, if high with multiple checks let the office know.  Jeryl Columbia 06/20/2020, 9:18 AM

## 2020-06-26 ENCOUNTER — Telehealth: Payer: Self-pay | Admitting: *Deleted

## 2020-06-26 ENCOUNTER — Ambulatory Visit: Payer: Medicare Other | Admitting: Gastroenterology

## 2020-06-26 NOTE — Telephone Encounter (Signed)
-----   Message from Antoine Poche, MD sent at 06/25/2020 12:41 PM EST ----- Echo shows normla heart pumping function, just some evidence of some age related stiffness of the heart muscle that likely is causing his swelling, but overall no major changes in heart function   Dominga Ferry MD

## 2020-06-26 NOTE — Telephone Encounter (Signed)
Pt voiced understanding

## 2020-06-27 ENCOUNTER — Encounter: Payer: Self-pay | Admitting: Internal Medicine

## 2020-06-28 ENCOUNTER — Other Ambulatory Visit (HOSPITAL_COMMUNITY)
Admission: RE | Admit: 2020-06-28 | Discharge: 2020-06-28 | Disposition: A | Discharge status: Home / Self Care | Payer: Medicare Other | Source: Ambulatory Visit | Attending: Cardiology | Admitting: Cardiology

## 2020-06-28 ENCOUNTER — Other Ambulatory Visit: Payer: Self-pay

## 2020-06-28 DIAGNOSIS — Z79899 Other long term (current) drug therapy: Secondary | ICD-10-CM | POA: Insufficient documentation

## 2020-06-28 DIAGNOSIS — R0602 Shortness of breath: Secondary | ICD-10-CM | POA: Diagnosis not present

## 2020-06-28 LAB — BASIC METABOLIC PANEL
Anion gap: 8 (ref 5–15)
BUN: 19 mg/dL (ref 8–23)
CO2: 29 mmol/L (ref 22–32)
Calcium: 8.4 mg/dL — ABNORMAL LOW (ref 8.9–10.3)
Chloride: 99 mmol/L (ref 98–111)
Creatinine, Ser: 0.96 mg/dL (ref 0.61–1.24)
GFR, Estimated: >60 mL/min (ref >60)
Glucose, Bld: 196 mg/dL — ABNORMAL HIGH (ref 70–99)
Potassium: 3.3 mmol/L — ABNORMAL LOW (ref 3.5–5.1)
Sodium: 136 mmol/L (ref 135–145)

## 2020-06-28 LAB — MAGNESIUM: Magnesium: 1.9 mg/dL (ref 1.7–2.4)

## 2020-07-03 DIAGNOSIS — E1165 Type 2 diabetes mellitus with hyperglycemia: Secondary | ICD-10-CM | POA: Diagnosis not present

## 2020-07-03 NOTE — Progress Notes (Signed)
Cardiology Office Note   Date:  07/09/2020   ID:  Russell Morrow, DOB July 19, 1950, MRN [id number]  PCP:  Benita Stabile, MD Cardiologist:  Dina Rich, MD 06/18/2020 Electrphysiologist: None Theodore Demark, PA-C    History of Present Illness: Russell Morrow is a 70 y.o. male with a history of NSTEMI 08/2018 s/p DES CFX w/ med rx nonobstructive RCA/LAD disease, EF 45-50%; EF nl on echo 06/2020, DM, HTN, HLD w/ statin intolerance on Zetia, PSVT, IDA, diverticulitis   06/18/2020 office visit, patient with SOB/DOE, Lasix increased to 40 mg twice daily, check BMET/MG in 1 week, repeat echo, follow-up in 3 weeks to assess volume status  Russell Morrow presents for cardiology follow up.  He was put on Humalog and increased his Lantus. His weight has increased since then. Is not compliant w/ diabetic diet.   He was getting side effects from low K+, increased the Kdur to 40 meq daily about a week ago.   He is pleased that his echo looked good.   He gets some chest discomfort, but not with exertion. Generally gets it when he gets abdominal bloating, just prior to having a bowel movement.   LE edema is improved, denies orthopnea or PND since the Lasix was increased.   Was supposed to have a sleep study, but has not. Was on CPAP at one time, lost st 315>>240 lbs and stopped it. Feels he might need it again.   COVID status: vaccinated, did not have COVID Past Medical History:  Diagnosis Date  . CAD (coronary artery disease)    a. mild nonobstructive CAD by cath in 2010 b. NST in 11/2015 showing a fixed defect with no ischemia and Coronary CT showing nonobstructive CAD along LAD and LCx c. 08/2018: s/p NSTEMI with cath showing acute total occlusion of LCx (treated with DES) and nonobstructive CAD along RCA and LAD.   . Diabetes mellitus without complication (HCC)   . Diverticulitis   . Gallstones   . Hypertension   . IDA (iron deficiency anemia)   . MI (myocardial infarction) (HCC)   . PSVT  (paroxysmal supraventricular tachycardia) (HCC)   . Restless leg syndrome   . Sleep apnea     Past Surgical History:  Procedure Laterality Date  . CERVICAL FUSION    . CHOLECYSTECTOMY N/A 01/31/2020   Procedure: LAPAROSCOPIC CHOLECYSTECTOMY;  Surgeon: Franky Macho, MD;  Location: [facility];  Service: General;  Laterality: N/A;  . CORONARY STENT INTERVENTION N/A 08/13/2018   Procedure: CORONARY STENT INTERVENTION;  Surgeon: Tonny Bollman, MD;  Location: [facility] INVASIVE CV LAB;  Service: Cardiovascular;  Laterality: N/A;  . LEFT HEART CATH AND CORONARY ANGIOGRAPHY N/A 08/13/2018   Procedure: LEFT HEART CATH AND CORONARY ANGIOGRAPHY;  Surgeon: Tonny Bollman, MD;  Location: [facility] INVASIVE CV LAB;  Service: Cardiovascular;  Laterality: N/A;  . nasal septum repair      Current Outpatient Medications  Medication Sig Dispense Refill  . acetaminophen (TYLENOL) 500 MG tablet Take 1,000 mg by mouth every 6 (six) hours as needed for moderate pain.     Marland Kitchen amLODipine (NORVASC) 10 MG tablet Take 1 tablet (10 mg total) by mouth daily. 90 tablet 3  . aspirin EC 81 MG EC tablet Take 1 tablet (81 mg total) by mouth daily.    Marland Kitchen dicyclomine (BENTYL) 10 MG capsule Take 10 mg by mouth 4 (four) times daily as needed for spasms.     Marland Kitchen esomeprazole (NEXIUM) 20 MG capsule Take 20  mg by mouth 2 (two) times daily before a meal.    . ezetimibe (ZETIA) 10 MG tablet Take 10 mg by mouth daily.    . FEROSUL 325 (65 Fe) MG tablet Take 325 mg by mouth 2 (two) times daily.     . furosemide (LASIX) 40 MG tablet Take 1 tablet (40 mg total) by mouth 2 (two) times daily. 180 tablet 3  . ibuprofen (ADVIL) 200 MG tablet Take 200 mg by mouth every 6 (six) hours as needed for moderate pain.     Marland Kitchen insulin lispro (HUMALOG) 100 UNIT/ML injection Inject 8-16 Units into the skin 3 (three) times daily with meals.    Marland Kitchen LANTUS SOLOSTAR 100 UNIT/ML Solostar Pen Inject 35 Units into the skin 2 (two) times daily. (Patient taking differently: Inject 45  Units into the skin 2 (two) times daily.) 15 mL 11  . metoprolol succinate (TOPROL-XL) 25 MG 24 hr tablet Take 3 tablets (75 mg total) by mouth daily. 90 tablet 5  . olmesartan (BENICAR) 40 MG tablet Take 1 tablet (40 mg total) by mouth daily. 90 tablet 3  . potassium chloride SA (KLOR-CON) 20 MEQ tablet Take 1 tablet (20 mEq total) by mouth daily. (Patient taking differently: Take 40 mEq by mouth daily.) 90 tablet 3  . pregabalin (LYRICA) 75 MG capsule Take 1 capsule (75 mg total) by mouth at bedtime. 30 capsule 0  . rOPINIRole (REQUIP) 1 MG tablet Take 2 mg by mouth at bedtime.    Marland Kitchen spironolactone (ALDACTONE) 25 MG tablet Take 1 tablet (25 mg total) by mouth daily. 90 tablet 3  . tadalafil (CIALIS) 10 MG tablet Take 10 mg by mouth daily as needed for erectile dysfunction.     . temazepam (RESTORIL) 15 MG capsule Take 15 mg by mouth at bedtime.     No current facility-administered medications for this visit.    Allergies:   Statins    Social History:  The patient  reports that he has never smoked. He has never used smokeless tobacco. He reports previous alcohol use of about 14.0 standard drinks of alcohol per week. He reports current drug use. Drug: Marijuana.   Family History:  The patient's family history includes Cancer in his mother; Diabetes in his brother and father; Heart disease in his brother and father; Hypertension in his brother, father, and mother.  He indicated that his mother is alive. He indicated that his father is deceased. He indicated that the status of his brother is unknown. He indicated that the status of his neg hx is unknown.   ROS:  Please see the history of present illness. All other systems are reviewed and negative.    PHYSICAL EXAM: VS:  BP (!) 158/90   Pulse 87   Ht 6\' 1"  (1.854 m)   Wt 262 lb 11.2 oz (119.2 kg)   SpO2 95%   BMI 34.66 kg/m  , BMI Body mass index is 34.66 kg/m. GEN: Well nourished, well developed, male in no acute distress HEENT:  normal for age  Neck: minimal JVD, no carotid bruit, no masses Cardiac: RRR; soft murmur, no rubs, or gallops Respiratory:  clear to auscultation bilaterally, normal work of breathing GI: soft, nontender, nondistended, + BS MS: no deformity or atrophy; trace-1+ LE edema, R>L; distal pulses are 2+ in all 4 extremities  Skin: warm and dry, no rash Neuro:  Strength and sensation are intact Psych: euthymic mood, full affect   EKG:  EKG is not ordered today.  ECHO: 06/20/2020 1. Left ventricular ejection fraction, by estimation, is 60 to 65%. The left ventricle has normal function. The left ventricle has no regional wall motion abnormalities. There is moderate left ventricular hypertrophy. Left ventricular diastolic parameters are indeterminate. Elevated left atrial pressure.  2. Right ventricular systolic function is normal. The right ventricular size is normal.  3. The mitral valve is normal in structure. Trivial mitral valve regurgitation. No evidence of mitral stenosis.  4. The aortic valve is tricuspid. There is mild calcification of the aortic valve. There is mild thickening of the aortic valve. Aortic valve regurgitation is not visualized. No aortic stenosis is present.  5. Aortic dilatation noted. There is mild dilatation of the ascending aorta, measuring 40 mm.  6. The inferior vena cava is normal in size with greater than 50% respiratory variability, suggesting right atrial pressure of 3 mmHg.   MYOVIEW: 12/29/2019  There was no ST segment deviation noted during stress.  Findings consistent with prior inferior/inferoapical myocardial infarction with moderate peri-infarct ischemia.  This is an intermediate risk study.  The left ventricular ejection fraction is moderately decreased (30-44%).   CATH: 08/13/2018 CORONARY STENT INTERVENTION  LEFT HEART CATH AND CORONARY ANGIOGRAPHY  Conclusion   1. Severe single-vessel coronary artery disease with acute total occlusion of  the left circumflex, treated successfully with PCI using a 3.5 x 16 mm Synergy DES 2. Nonobstructive LAD and RCA stenoses as outlined 3. Mild segmental LV systolic dysfunction with LVEF estimated at 45 to 50% with normal LVEDP.  Recommendations: Dual antiplatelet therapy with aspirin and ticagrelor x12 months. Aggressive post MI medical therapy.   Intervention      Recent Labs: 01/30/2020: ALT 26; Hemoglobin 14.1; Platelets 211 06/28/2020: Magnesium 1.9 07/09/2020: BUN 20; Creatinine, Ser 0.99; Potassium 3.6; Sodium 137  CBC    Component Value Date/Time   WBC 7.2 01/30/2020 1107   RBC 5.33 01/30/2020 1107   HGB 14.1 01/30/2020 1107   HCT 44.2 01/30/2020 1107   PLT 211 01/30/2020 1107   MCV 82.9 01/30/2020 1107   MCH 26.5 01/30/2020 1107   MCHC 31.9 01/30/2020 1107   RDW 18.8 (H) 01/30/2020 1107   LYMPHSABS 2.0 01/30/2020 1107   MONOABS 0.8 01/30/2020 1107   EOSABS 0.1 01/30/2020 1107   BASOSABS 0.0 01/30/2020 1107   CMP Latest Ref Rng & Units 07/09/2020 06/28/2020 01/30/2020  Glucose 70 - 99 mg/dL 621(H) 086(V) 784(O)  BUN 8 - 23 mg/dL 20 19 14   Creatinine 0.61 - 1.24 mg/dL 9.62 9.52  Sodium 135 - 145 mmol/L 137 136 136  Potassium 3.5 - 5.1 mmol/L 3.6 3.3(L) 2.8(L)  Chloride 98 - 111 mmol/L 98 99 96(L)  CO2 22 - 32 mmol/L 31 29 27   Calcium 8.9 - 10.3 mg/dL 9.0 8.41) 9.0  Total Protein 6.5 - 8.1 g/dL - - 7.1  Total Bilirubin 0.3 - 1.2 mg/dL - - 0.6  Alkaline Phos 38 - 126 U/L - - 56  AST 15 - 41 U/L - - 17  ALT 0 - 44 U/L - - 26     Lipid Panel Lab Results  Component Value Date   CHOL 143 08/14/2018   HDL 26 (L) 08/14/2018   LDLCALC 99 08/14/2018   TRIG 91 08/14/2018   CHOLHDL 5.5 08/14/2018   Lab Results  Component Value Date   CHOL 151 06/07/2020   HDL 41  PCP labs   LDLCALC 93     TRIG 89  Lab Results  Component Value Date   TSH 0.589 07/29/2018     Wt Readings from Last 3 Encounters:  07/09/20 262 lb 11.2 oz (119.2 kg)   06/18/20 264 lb (119.7 kg)  02/06/20 243 lb (110.2 kg)     Other studies Reviewed: Additional studies/ records that were reviewed today include: Office notes, hospital records and testing.  ASSESSMENT AND PLAN:  1.  Hypertension: -His blood pressure is not well controlled. -He is on the maximum dose of amlodipine -We will try increasing theToprol XL to 100 mg daily, not done until after the patient left, he was called and told about it. -He is also on spironolactone 25 mg daily and Lasix 40 mg daily. -Both systolic and diastolic blood pressures are elevated, so I feel additional medication is needed -Add clonidine 0.1 mg twice daily and follow response and symptoms. -Increase the clonidine to 2 tablets twice a day on week 2 and then 3 tablets twice a day on week 3 if needed for blood pressure control.  -He will communicate through Mychart on his response to treatment and potential side effects  2.  CAD: -Continue aspirin, and Zetia, increase beta-blocker  3.  Hyperlipidemia: -He is intolerant to statins, tolerating Zetia. -He admits that he is not very diet compliant, he is encouraged to increase this, otherwise he will need to be considered for PCSK9 therapy  4.  Volume overload, lower extremity edema, hypokalemia: -His weight is up 20 pounds in the last 5 months. -He feels he has gained some weight because of changes to his diabetes medications, but I feel at least some of this is fluid -The edema improved with Lasix 40 mg twice daily but he still has some overload on exam, continue this dose -He had hypokalemia when his BMET was checked last, recheck -He says he does not use salt, does not believe he is over drinking. -he is encouraged to manage his intake and stick tightly to a low-sodium diabetic diet -Daily weights   Current medicines are reviewed at length with the patient today.  The patient does not have concerns regarding medicines.  The following changes have been  made: Add clonidine, increase metoprolol  Labs/ tests ordered today include:   Orders Placed This Encounter  Procedures  . Basic Metabolic Panel (BMET)     Disposition:   FU with Dina RichBranch, Jonathan, MD  Signed, Theodore Demarkhonda Jobani Sabado, PA-C  07/09/2020 4:45 PM    [facility] Phone: [phone](336) [phone]; Fax: [phone](336) [phone]

## 2020-07-06 ENCOUNTER — Telehealth: Payer: Self-pay | Admitting: *Deleted

## 2020-07-06 NOTE — Telephone Encounter (Signed)
-----   Message from Antoine Poche, MD sent at 07/06/2020 10:49 AM EST ----- Labs show potassium is midlly low, can he increase his KCl to daily   Dominga Ferry MD

## 2020-07-07 DIAGNOSIS — E1165 Type 2 diabetes mellitus with hyperglycemia: Secondary | ICD-10-CM | POA: Diagnosis not present

## 2020-07-07 DIAGNOSIS — G4700 Insomnia, unspecified: Secondary | ICD-10-CM | POA: Diagnosis not present

## 2020-07-07 DIAGNOSIS — D649 Anemia, unspecified: Secondary | ICD-10-CM | POA: Diagnosis not present

## 2020-07-07 DIAGNOSIS — Z8719 Personal history of other diseases of the digestive system: Secondary | ICD-10-CM | POA: Diagnosis not present

## 2020-07-07 DIAGNOSIS — I10 Essential (primary) hypertension: Secondary | ICD-10-CM | POA: Diagnosis not present

## 2020-07-07 DIAGNOSIS — M1990 Unspecified osteoarthritis, unspecified site: Secondary | ICD-10-CM | POA: Diagnosis not present

## 2020-07-07 DIAGNOSIS — E782 Mixed hyperlipidemia: Secondary | ICD-10-CM | POA: Diagnosis not present

## 2020-07-07 DIAGNOSIS — G2581 Restless legs syndrome: Secondary | ICD-10-CM | POA: Diagnosis not present

## 2020-07-07 DIAGNOSIS — I2510 Atherosclerotic heart disease of native coronary artery without angina pectoris: Secondary | ICD-10-CM | POA: Diagnosis not present

## 2020-07-07 DIAGNOSIS — M791 Myalgia, unspecified site: Secondary | ICD-10-CM | POA: Diagnosis not present

## 2020-07-09 ENCOUNTER — Other Ambulatory Visit: Payer: Self-pay

## 2020-07-09 ENCOUNTER — Encounter: Payer: Self-pay | Admitting: Physician Assistant

## 2020-07-09 ENCOUNTER — Other Ambulatory Visit (HOSPITAL_COMMUNITY)
Admission: RE | Admit: 2020-07-09 | Discharge: 2020-07-09 | Disposition: A | Discharge status: Home / Self Care | Payer: Medicare Other | Source: Ambulatory Visit | Attending: Cardiology | Admitting: Cardiology

## 2020-07-09 ENCOUNTER — Ambulatory Visit: Payer: Medicare Other | Admitting: Physician Assistant

## 2020-07-09 VITALS — BP 158/90 | HR 87 | Ht 73.0 in | Wt 262.7 lb

## 2020-07-09 DIAGNOSIS — I10 Essential (primary) hypertension: Secondary | ICD-10-CM

## 2020-07-09 DIAGNOSIS — E782 Mixed hyperlipidemia: Secondary | ICD-10-CM

## 2020-07-09 DIAGNOSIS — E8779 Other fluid overload: Secondary | ICD-10-CM

## 2020-07-09 DIAGNOSIS — I2510 Atherosclerotic heart disease of native coronary artery without angina pectoris: Secondary | ICD-10-CM | POA: Diagnosis not present

## 2020-07-09 DIAGNOSIS — E876 Hypokalemia: Secondary | ICD-10-CM | POA: Insufficient documentation

## 2020-07-09 LAB — BASIC METABOLIC PANEL
Anion gap: 8 (ref 5–15)
BUN: 20 mg/dL (ref 8–23)
CO2: 31 mmol/L (ref 22–32)
Calcium: 9 mg/dL (ref 8.9–10.3)
Chloride: 98 mmol/L (ref 98–111)
Creatinine, Ser: 0.99 mg/dL (ref 0.61–1.24)
GFR, Estimated: >60 mL/min (ref >60)
Glucose, Bld: 206 mg/dL — ABNORMAL HIGH (ref 70–99)
Potassium: 3.6 mmol/L (ref 3.5–5.1)
Sodium: 137 mmol/L (ref 135–145)

## 2020-07-09 MED ORDER — CLONIDINE HCL 0.1 MG PO TABS: ORAL_TABLET | ORAL | 11 refills | Status: DC

## 2020-07-09 NOTE — Patient Instructions (Signed)
Medication Instructions:  START Clonidine 0.1 mg tablets: Week 1: 1 tablet twice daily Week 2: 2 tablets twice daily Week 3: 3 tablets twice daily  *If you need a refill on your cardiac medications before your next appointment, please call your pharmacy*   Lab Work: BMET- Today If you have labs (blood work) drawn today and your tests are completely normal, you will receive your results only by: Marland Kitchen MyChart Message (if you have MyChart) OR . A paper copy in the mail If you have any lab test that is abnormal or we need to change your treatment, we will call you to review the results.   Testing/Procedures: None Toay   Follow-Up: At [facility], you and your health needs are our priority.  As part of our continuing mission to provide you with exceptional heart care, we have created designated Provider Care Teams.  These Care Teams include your primary Cardiologist (physician) and Advanced Practice Providers (APPs -  Physician Assistants and Nurse Practitioners) who all work together to provide you with the care you need, when you need it.  We recommend signing up for the patient portal called "MyChart".  Sign up information is provided on this After Visit Summary.  MyChart is used to connect with patients for Virtual Visits (Telemedicine).  Patients are able to view lab/test results, encounter notes, upcoming appointments, etc.  Non-urgent messages can be sent to your provider as well.   To learn more about what you can do with MyChart, go to [email or web address].    Your next appointment:   3 month(s)  The format for your next appointment:   In Person  Provider:   Dina Rich, MD   Other Instructions Please keep B/P and HR check. Please call if you have any questions/concerns

## 2020-07-10 ENCOUNTER — Other Ambulatory Visit: Payer: Self-pay

## 2020-07-10 ENCOUNTER — Telehealth: Payer: Self-pay

## 2020-07-10 ENCOUNTER — Encounter: Payer: Self-pay | Admitting: Cardiovascular Disease

## 2020-07-10 ENCOUNTER — Telehealth: Payer: Self-pay | Admitting: Cardiology

## 2020-07-10 MED ORDER — METOPROLOL SUCCINATE 100 MG PO CS24: 100.0000 mg | EXTENDED_RELEASE_CAPSULE | Freq: Every day | ORAL | 3 refills | Status: DC

## 2020-07-10 MED ORDER — POTASSIUM CHLORIDE CRYS ER 20 MEQ PO TBCR: 40.0000 meq | EXTENDED_RELEASE_TABLET | Freq: Every day | ORAL | 3 refills | Status: DC

## 2020-07-10 NOTE — Telephone Encounter (Signed)
Call placed to [facility] to let them know about the change. Per Russell Morrow's note:  Hypertension: -His blood pressure is not well controlled. -He is on the maximum dose of amlodipine -We will try increasing theToprol XL to 100 mg daily, not done until after the patient left, he was called and told about it. -He is also on spironolactone 25 mg daily and Lasix 40 mg daily. -Both systolic and diastolic blood pressures are elevated, so I feel additional medication is needed -Add clonidine 0.1 mg twice daily and follow response and symptoms. -Increase the clonidine to 2 tablets twice a day on week 2 and then 3 tablets twice a day on week 3 if needed for blood pressure control.  -He will communicate through Mychart on his response to treatment and potential side effects

## 2020-07-10 NOTE — Addendum Note (Signed)
Addended by: Leonides Schanz C on: 07/10/2020 10:18 AM   Modules accepted: Orders

## 2020-07-10 NOTE — Telephone Encounter (Signed)
Error

## 2020-07-10 NOTE — Telephone Encounter (Signed)
Contacted patient. He verbalized understanding of result note. He confirmed that he had enough Kdur 60 Meq for the next couple of days, so I will send him in a refill to his pharmacy. He had no questions or concerns as of now.

## 2020-07-10 NOTE — Telephone Encounter (Signed)
-----   Message from Darrol Jump, PA-C sent at 07/10/2020 10:18 AM EST ----- Please let him know that his BMET is fine. Continue Kdur 40 meq daily. Please make sure he has enough to take 2 tabs daily. Thanks

## 2020-07-10 NOTE — Telephone Encounter (Signed)
Pt c/o medication issue:  1. Name of Medication: Metoprolol Succinate 100 MG CS24  2. How are you currently taking this medication (dosage and times per day)? Patient has been taking 75 MG daily, however, Rhonda Barrett increased to 100 MG daily at 07/09/20 office visit.  3. Are you having a reaction (difficulty breathing--STAT)? No   4. What is your medication issue? Katrina with [facility] is requesting clarification on how the patient is to take the medication prior to distribution.   Please return call to discuss at [phone] (ext: [phone])

## 2020-07-13 NOTE — Telephone Encounter (Signed)
Milon Score Ashworth, CMA  07/10/2020 12:58 PM EST Back to Top     Pt made aware by reidsivlle staff per 2/1 phone not

## 2020-07-18 ENCOUNTER — Other Ambulatory Visit: Payer: Self-pay | Admitting: *Deleted

## 2020-07-18 MED ORDER — CLONIDINE HCL 0.1 MG PO TABS: ORAL_TABLET | ORAL | 11 refills | Status: DC

## 2020-07-18 MED ORDER — METOPROLOL SUCCINATE 100 MG PO CS24: 100.0000 mg | EXTENDED_RELEASE_CAPSULE | Freq: Every day | ORAL | 3 refills | Status: DC

## 2020-07-19 ENCOUNTER — Other Ambulatory Visit: Payer: Self-pay | Admitting: *Deleted

## 2020-07-19 NOTE — Patient Outreach (Signed)
[facility]) Care Management  07/19/2020  Russell Morrow [birth year] [id number]  Telephone outreach for follow up: DM, wt management.  Pt has seen his primary care MD and cardiologist within the last few weeks. Med changes made by each. Metoprolol increased to 100 mg daily, addition of clonopin titrating up to 0.3 mg daily, trulicity added and lantus increased  To 50 units bid.  Goals Addressed            This Visit's Progress   . Achieve a Healthy Weight byl losing 10# by the end of May.   On track    Follow Up Date 10/21/20 Start date 03/14/20 Expected end date: 10/21/20 Medium priority  Taking small steps to change how you eat and exercise is a good place to start. Review ALL ABOUT CARBS and make small changes in choices with goal of 45 Gms per meal. 06/19/20 Pt reports he is eating a healthy diet. He has the information NP sent and has reviewed it. Wt is up 20# since October, thought to be fluid wt. Has been to see cardiology and furosemide and potassium have been added to his regimen. He will f/u in 2 weeks. 07/29/20 Weight down 5# to 260#. Pt reports his diet is still relatively healthy but he does snack. Reinforced healthy snacks, keeping them to only 15 Gms.      . Monitor and Manage My Blood Sugar per pt report of fbs & nfbs on next call in May.   On track    Start date 03/14/20 High priority Projected end date 10/21/20 Follow Up Date 10/21/20  Daily glucose monitoriing ongoing. Notes: Currently monitoring daily and prn. 06/19/20 FBS levels near 200 and before meals up to 250. Stopped metformin for a trial and his GI problems he was having have resolved. He is only taking insulin which he has had to increase his short acting with meals. Dr. Margo Aye is trying to decide on new medication that will help and not be a financial burden. 07/29/20 Pt reports latest Hgb A1C was 7.8 improved from 8.1. FBS range 130-140s, NFBS range 140-200. New meds: Trulicity, Lantus increased to  50 units bid. Pt is very good at checking his glucose levels, praised for his continued self management. Reminded of goals fbs <120 and nfbs <160.    Marland Kitchen COMPLETED: Set My Target A1C       Follow Up Date 07/24/20 Target A1C is 7.0 Notes: Last A1C in Aug was 8.1. 06/19/20 recent HgbA1C was 7.8! Moving in the right direction!    . Set My Weight Loss Goal 10# from 265 to 255 by 10/21/20.   On track    Start date 03/14/21 Medium priority Expected end date: 10/21/20 Follow Up Date 10/21/20 Goal and target date adjusted 06/30/20.  06/19/20 Mr. Steidle has had some changes in his medication regimen which has affected is ability to move towards this goal. He has actually gained 20# since we last talked in October. His current wt is 265. We discussed that the goal is not realistic and will reduce the goal to 10# by 09/14/20. His primary care MD should be getting back to him about adding another diabetic medication that may help. He is on Humalog and Lantus only at present. He is aware of how he should be eating and does not eat too many sweets. He saw his cardiologist who started him on furosemide and potassium. He will follow up in 2 weeks with cardiology, one month with primary  care. 07/19/20 Current wt 260# (-5#) Praised for wt loss. Has edema, reinforced leg elevation. He is unable to apply stockings. Reinforced low carb diet and activity.       We agreed to talk again in 3 months.  [name]. [name], MSN, [facility] Gerontological Nurse Practitioner [facility] Care Management [phone]

## 2020-08-03 DIAGNOSIS — E1165 Type 2 diabetes mellitus with hyperglycemia: Secondary | ICD-10-CM | POA: Diagnosis not present

## 2020-08-06 ENCOUNTER — Encounter: Payer: Self-pay | Admitting: Internal Medicine

## 2020-08-06 ENCOUNTER — Ambulatory Visit: Payer: Medicare Other | Admitting: Gastroenterology

## 2020-08-06 DIAGNOSIS — Z8719 Personal history of other diseases of the digestive system: Secondary | ICD-10-CM | POA: Diagnosis not present

## 2020-08-06 DIAGNOSIS — E1169 Type 2 diabetes mellitus with other specified complication: Secondary | ICD-10-CM | POA: Diagnosis not present

## 2020-08-06 DIAGNOSIS — E1165 Type 2 diabetes mellitus with hyperglycemia: Secondary | ICD-10-CM | POA: Diagnosis not present

## 2020-08-06 DIAGNOSIS — M1990 Unspecified osteoarthritis, unspecified site: Secondary | ICD-10-CM | POA: Diagnosis not present

## 2020-08-06 DIAGNOSIS — M791 Myalgia, unspecified site: Secondary | ICD-10-CM | POA: Diagnosis not present

## 2020-08-06 DIAGNOSIS — I10 Essential (primary) hypertension: Secondary | ICD-10-CM | POA: Diagnosis not present

## 2020-08-06 DIAGNOSIS — D649 Anemia, unspecified: Secondary | ICD-10-CM | POA: Diagnosis not present

## 2020-08-06 DIAGNOSIS — I2510 Atherosclerotic heart disease of native coronary artery without angina pectoris: Secondary | ICD-10-CM | POA: Diagnosis not present

## 2020-08-06 DIAGNOSIS — G2581 Restless legs syndrome: Secondary | ICD-10-CM | POA: Diagnosis not present

## 2020-08-06 DIAGNOSIS — G4700 Insomnia, unspecified: Secondary | ICD-10-CM | POA: Diagnosis not present

## 2020-08-06 DIAGNOSIS — E782 Mixed hyperlipidemia: Secondary | ICD-10-CM | POA: Diagnosis not present

## 2020-08-31 DIAGNOSIS — E1165 Type 2 diabetes mellitus with hyperglycemia: Secondary | ICD-10-CM | POA: Diagnosis not present

## 2020-09-05 DIAGNOSIS — G4700 Insomnia, unspecified: Secondary | ICD-10-CM | POA: Diagnosis not present

## 2020-09-05 DIAGNOSIS — G2581 Restless legs syndrome: Secondary | ICD-10-CM | POA: Diagnosis not present

## 2020-09-05 DIAGNOSIS — Z8719 Personal history of other diseases of the digestive system: Secondary | ICD-10-CM | POA: Diagnosis not present

## 2020-09-05 DIAGNOSIS — E782 Mixed hyperlipidemia: Secondary | ICD-10-CM | POA: Diagnosis not present

## 2020-09-05 DIAGNOSIS — I10 Essential (primary) hypertension: Secondary | ICD-10-CM | POA: Diagnosis not present

## 2020-09-05 DIAGNOSIS — M791 Myalgia, unspecified site: Secondary | ICD-10-CM | POA: Diagnosis not present

## 2020-09-05 DIAGNOSIS — E1169 Type 2 diabetes mellitus with other specified complication: Secondary | ICD-10-CM | POA: Diagnosis not present

## 2020-09-05 DIAGNOSIS — D649 Anemia, unspecified: Secondary | ICD-10-CM | POA: Diagnosis not present

## 2020-09-05 DIAGNOSIS — E1165 Type 2 diabetes mellitus with hyperglycemia: Secondary | ICD-10-CM | POA: Diagnosis not present

## 2020-09-05 DIAGNOSIS — M1990 Unspecified osteoarthritis, unspecified site: Secondary | ICD-10-CM | POA: Diagnosis not present

## 2020-09-17 DIAGNOSIS — E1169 Type 2 diabetes mellitus with other specified complication: Secondary | ICD-10-CM | POA: Diagnosis not present

## 2020-09-17 DIAGNOSIS — E876 Hypokalemia: Secondary | ICD-10-CM | POA: Diagnosis not present

## 2020-09-17 DIAGNOSIS — E782 Mixed hyperlipidemia: Secondary | ICD-10-CM | POA: Diagnosis not present

## 2020-09-17 DIAGNOSIS — D649 Anemia, unspecified: Secondary | ICD-10-CM | POA: Diagnosis not present

## 2020-09-18 DIAGNOSIS — M1990 Unspecified osteoarthritis, unspecified site: Secondary | ICD-10-CM | POA: Diagnosis not present

## 2020-09-18 DIAGNOSIS — E782 Mixed hyperlipidemia: Secondary | ICD-10-CM | POA: Diagnosis not present

## 2020-09-18 DIAGNOSIS — Z8719 Personal history of other diseases of the digestive system: Secondary | ICD-10-CM | POA: Diagnosis not present

## 2020-09-18 DIAGNOSIS — G2581 Restless legs syndrome: Secondary | ICD-10-CM | POA: Diagnosis not present

## 2020-09-18 DIAGNOSIS — E1169 Type 2 diabetes mellitus with other specified complication: Secondary | ICD-10-CM | POA: Diagnosis not present

## 2020-09-18 DIAGNOSIS — I2510 Atherosclerotic heart disease of native coronary artery without angina pectoris: Secondary | ICD-10-CM | POA: Diagnosis not present

## 2020-09-18 DIAGNOSIS — Z23 Encounter for immunization: Secondary | ICD-10-CM | POA: Diagnosis not present

## 2020-09-18 DIAGNOSIS — M791 Myalgia, unspecified site: Secondary | ICD-10-CM | POA: Diagnosis not present

## 2020-09-18 DIAGNOSIS — G4700 Insomnia, unspecified: Secondary | ICD-10-CM | POA: Diagnosis not present

## 2020-09-18 DIAGNOSIS — I10 Essential (primary) hypertension: Secondary | ICD-10-CM | POA: Diagnosis not present

## 2020-09-18 DIAGNOSIS — E1165 Type 2 diabetes mellitus with hyperglycemia: Secondary | ICD-10-CM | POA: Diagnosis not present

## 2020-09-30 DIAGNOSIS — E1165 Type 2 diabetes mellitus with hyperglycemia: Secondary | ICD-10-CM | POA: Diagnosis not present

## 2020-10-07 DIAGNOSIS — E1165 Type 2 diabetes mellitus with hyperglycemia: Secondary | ICD-10-CM | POA: Diagnosis not present

## 2020-10-07 DIAGNOSIS — M1990 Unspecified osteoarthritis, unspecified site: Secondary | ICD-10-CM | POA: Diagnosis not present

## 2020-10-08 ENCOUNTER — Inpatient Hospital Stay (HOSPITAL_COMMUNITY)
Admission: EM | Admit: 2020-10-08 | Discharge: 2020-10-15 | DRG: 234 | Disposition: A | Discharge status: Home / Self Care | Payer: Medicare Other | Attending: Cardiothoracic Surgery | Admitting: Cardiothoracic Surgery

## 2020-10-08 ENCOUNTER — Emergency Department (HOSPITAL_COMMUNITY): Payer: Medicare Other

## 2020-10-08 ENCOUNTER — Encounter (HOSPITAL_COMMUNITY): Payer: Self-pay | Admitting: *Deleted

## 2020-10-08 ENCOUNTER — Other Ambulatory Visit: Payer: Self-pay

## 2020-10-08 DIAGNOSIS — I110 Hypertensive heart disease with heart failure: Secondary | ICD-10-CM | POA: Diagnosis present

## 2020-10-08 DIAGNOSIS — E876 Hypokalemia: Secondary | ICD-10-CM | POA: Diagnosis not present

## 2020-10-08 DIAGNOSIS — G4730 Sleep apnea, unspecified: Secondary | ICD-10-CM | POA: Diagnosis present

## 2020-10-08 DIAGNOSIS — E871 Hypo-osmolality and hyponatremia: Secondary | ICD-10-CM | POA: Diagnosis not present

## 2020-10-08 DIAGNOSIS — I214 Non-ST elevation (NSTEMI) myocardial infarction: Secondary | ICD-10-CM

## 2020-10-08 DIAGNOSIS — E119 Type 2 diabetes mellitus without complications: Secondary | ICD-10-CM | POA: Diagnosis present

## 2020-10-08 DIAGNOSIS — R072 Precordial pain: Secondary | ICD-10-CM

## 2020-10-08 DIAGNOSIS — D696 Thrombocytopenia, unspecified: Secondary | ICD-10-CM | POA: Diagnosis not present

## 2020-10-08 DIAGNOSIS — K760 Fatty (change of) liver, not elsewhere classified: Secondary | ICD-10-CM | POA: Diagnosis not present

## 2020-10-08 DIAGNOSIS — D62 Acute posthemorrhagic anemia: Secondary | ICD-10-CM | POA: Diagnosis not present

## 2020-10-08 DIAGNOSIS — Z8249 Family history of ischemic heart disease and other diseases of the circulatory system: Secondary | ICD-10-CM

## 2020-10-08 DIAGNOSIS — Z951 Presence of aortocoronary bypass graft: Secondary | ICD-10-CM

## 2020-10-08 DIAGNOSIS — I5032 Chronic diastolic (congestive) heart failure: Secondary | ICD-10-CM | POA: Diagnosis present

## 2020-10-08 DIAGNOSIS — I088 Other rheumatic multiple valve diseases: Secondary | ICD-10-CM | POA: Diagnosis not present

## 2020-10-08 DIAGNOSIS — Z888 Allergy status to other drugs, medicaments and biological substances status: Secondary | ICD-10-CM

## 2020-10-08 DIAGNOSIS — K219 Gastro-esophageal reflux disease without esophagitis: Secondary | ICD-10-CM | POA: Diagnosis present

## 2020-10-08 DIAGNOSIS — Z955 Presence of coronary angioplasty implant and graft: Secondary | ICD-10-CM

## 2020-10-08 DIAGNOSIS — I25110 Atherosclerotic heart disease of native coronary artery with unstable angina pectoris: Secondary | ICD-10-CM | POA: Diagnosis not present

## 2020-10-08 DIAGNOSIS — J90 Pleural effusion, not elsewhere classified: Secondary | ICD-10-CM

## 2020-10-08 DIAGNOSIS — J9811 Atelectasis: Secondary | ICD-10-CM | POA: Diagnosis not present

## 2020-10-08 DIAGNOSIS — E785 Hyperlipidemia, unspecified: Secondary | ICD-10-CM | POA: Diagnosis present

## 2020-10-08 DIAGNOSIS — Z9689 Presence of other specified functional implants: Secondary | ICD-10-CM

## 2020-10-08 DIAGNOSIS — Z20822 Contact with and (suspected) exposure to covid-19: Secondary | ICD-10-CM | POA: Diagnosis present

## 2020-10-08 DIAGNOSIS — Z833 Family history of diabetes mellitus: Secondary | ICD-10-CM | POA: Diagnosis not present

## 2020-10-08 DIAGNOSIS — I493 Ventricular premature depolarization: Secondary | ICD-10-CM | POA: Diagnosis not present

## 2020-10-08 DIAGNOSIS — I2699 Other pulmonary embolism without acute cor pulmonale: Secondary | ICD-10-CM | POA: Diagnosis not present

## 2020-10-08 DIAGNOSIS — I517 Cardiomegaly: Secondary | ICD-10-CM | POA: Diagnosis not present

## 2020-10-08 DIAGNOSIS — R079 Chest pain, unspecified: Secondary | ICD-10-CM | POA: Diagnosis not present

## 2020-10-08 DIAGNOSIS — I252 Old myocardial infarction: Secondary | ICD-10-CM | POA: Diagnosis not present

## 2020-10-08 DIAGNOSIS — I471 Supraventricular tachycardia: Secondary | ICD-10-CM | POA: Diagnosis not present

## 2020-10-08 DIAGNOSIS — Z803 Family history of malignant neoplasm of breast: Secondary | ICD-10-CM

## 2020-10-08 DIAGNOSIS — Z981 Arthrodesis status: Secondary | ICD-10-CM | POA: Diagnosis not present

## 2020-10-08 DIAGNOSIS — Z09 Encounter for follow-up examination after completed treatment for conditions other than malignant neoplasm: Secondary | ICD-10-CM

## 2020-10-08 DIAGNOSIS — I10 Essential (primary) hypertension: Secondary | ICD-10-CM | POA: Diagnosis present

## 2020-10-08 DIAGNOSIS — I200 Unstable angina: Secondary | ICD-10-CM | POA: Diagnosis present

## 2020-10-08 DIAGNOSIS — Z01810 Encounter for preprocedural cardiovascular examination: Secondary | ICD-10-CM | POA: Diagnosis not present

## 2020-10-08 DIAGNOSIS — G2581 Restless legs syndrome: Secondary | ICD-10-CM | POA: Diagnosis present

## 2020-10-08 DIAGNOSIS — I2510 Atherosclerotic heart disease of native coronary artery without angina pectoris: Secondary | ICD-10-CM | POA: Diagnosis present

## 2020-10-08 LAB — CBC
HCT: 49.7 % (ref 39.0–52.0)
Hemoglobin: 17.3 g/dL — ABNORMAL HIGH (ref 13.0–17.0)
MCH: 32.9 pg (ref 26.0–34.0)
MCHC: 34.8 g/dL (ref 30.0–36.0)
MCV: 94.5 fL (ref 80.0–100.0)
Platelets: 171 10*3/uL (ref 150–400)
RBC: 5.26 MIL/uL (ref 4.22–5.81)
RDW: 13.6 % (ref 11.5–15.5)
WBC: 11.4 10*3/uL — ABNORMAL HIGH (ref 4.0–10.5)
nRBC: 0 % (ref 0.0–0.2)

## 2020-10-08 LAB — HEPATIC FUNCTION PANEL
ALT: 49 U/L — ABNORMAL HIGH (ref 0–44)
AST: 26 U/L (ref 15–41)
Albumin: 4.1 g/dL (ref 3.5–5.0)
Alkaline Phosphatase: 84 U/L (ref 38–126)
Bilirubin, Direct: 0.2 mg/dL (ref 0.0–0.2)
Indirect Bilirubin: 0.9 mg/dL (ref 0.3–0.9)
Total Bilirubin: 1.1 mg/dL (ref 0.3–1.2)
Total Protein: 7.3 g/dL (ref 6.5–8.1)

## 2020-10-08 LAB — TROPONIN I (HIGH SENSITIVITY)
Troponin I (High Sensitivity): 3 ng/L (ref <18)
Troponin I (High Sensitivity): 3 ng/L (ref <18)
Troponin I (High Sensitivity): 6 ng/L (ref <18)
Troponin I (High Sensitivity): 15 ng/L (ref <18)

## 2020-10-08 LAB — BASIC METABOLIC PANEL
Anion gap: 12 (ref 5–15)
BUN: 22 mg/dL (ref 8–23)
CO2: 28 mmol/L (ref 22–32)
Calcium: 9.4 mg/dL (ref 8.9–10.3)
Chloride: 98 mmol/L (ref 98–111)
Creatinine, Ser: 0.93 mg/dL (ref 0.61–1.24)
GFR, Estimated: >60 mL/min (ref >60)
Glucose, Bld: 222 mg/dL — ABNORMAL HIGH (ref 70–99)
Potassium: 3.4 mmol/L — ABNORMAL LOW (ref 3.5–5.1)
Sodium: 138 mmol/L (ref 135–145)

## 2020-10-08 LAB — LIPASE, BLOOD: Lipase: 27 U/L (ref 11–51)

## 2020-10-08 LAB — CBG MONITORING, ED: Glucose-Capillary: 230 mg/dL — ABNORMAL HIGH (ref 70–99)

## 2020-10-08 MED ORDER — PANTOPRAZOLE SODIUM 40 MG IV SOLR: 40.0000 mg | INTRAVENOUS | Status: DC

## 2020-10-08 MED ORDER — IOHEXOL 350 MG/ML SOLN
100.0000 mL | Freq: Once | INTRAVENOUS | Status: AC | PRN
  Administered 2020-10-08: 100 mL via INTRAVENOUS

## 2020-10-08 MED ORDER — ASPIRIN 81 MG PO CHEW
324.0000 mg | CHEWABLE_TABLET | Freq: Once | ORAL | Status: AC
  Administered 2020-10-08: 324 mg via ORAL
  Filled 2020-10-08: qty 4

## 2020-10-08 MED ORDER — ONDANSETRON HCL 4 MG/2ML IJ SOLN
4.0000 mg | Freq: Once | INTRAMUSCULAR | Status: AC
  Administered 2020-10-08: 4 mg via INTRAVENOUS
  Filled 2020-10-08: qty 2

## 2020-10-08 MED ORDER — MORPHINE SULFATE (PF) 4 MG/ML IV SOLN
4.0000 mg | Freq: Once | INTRAVENOUS | Status: AC
Start: 2020-10-08 — End: 2020-10-08
  Administered 2020-10-08: 4 mg via INTRAVENOUS
  Filled 2020-10-08: qty 1

## 2020-10-08 MED ORDER — ALUM & MAG HYDROXIDE-SIMETH 200-200-20 MG/5ML PO SUSP
15.0000 mL | Freq: Once | ORAL | Status: AC
  Administered 2020-10-08: 15 mL via ORAL
  Filled 2020-10-08: qty 30

## 2020-10-08 MED ORDER — MORPHINE SULFATE (PF) 4 MG/ML IV SOLN
4.0000 mg | Freq: Once | INTRAVENOUS | Status: AC
  Administered 2020-10-08: 4 mg via INTRAVENOUS
  Filled 2020-10-08: qty 1

## 2020-10-08 MED ORDER — HEPARIN BOLUS VIA INFUSION
4000.0000 [IU] | Freq: Once | INTRAVENOUS | Status: AC
  Administered 2020-10-08: 4000 [IU] via INTRAVENOUS

## 2020-10-08 MED ORDER — HEPARIN (PORCINE) 25000 UT/250ML-% IV SOLN
1800.0000 [IU]/h | INTRAVENOUS | Status: DC
  Administered 2020-10-08: 1400 [IU]/h via INTRAVENOUS
  Administered 2020-10-09: 1800 [IU]/h via INTRAVENOUS
  Filled 2020-10-08 (×2): qty 250

## 2020-10-08 NOTE — Progress Notes (Signed)
ANTICOAGULATION CONSULT NOTE - Initial Consult  Pharmacy Consult for heparin  Indication: ACS/STEMI  Allergies  Allergen Reactions  . Statins     Muscle aches, liver pain; Tried Atorvastatin, Crestor, Pravastatin, and Simvastatin    Patient Measurements: Weight: 117.9 kg (260 lb) Heparin Dosing Weight: 105kg  Vital Signs: Temp: 97.8 F (36.6 C) (05/02 1320) Temp Source: Oral (05/02 1320) BP: 188/109 (05/02 2000) Pulse Rate: 93 (05/02 2000)  Labs: Recent Labs    10/08/20 1342 10/08/20 1523  HGB 17.3*  --   HCT 49.7  --   PLT 171  --   CREATININE 0.93  --   TROPONINIHS 3 3    Estimated Creatinine Clearance: 98 mL/min (by C-G formula based on SCr of 0.93 mg/dL).   Medical History: Past Medical History:  Diagnosis Date  . CAD (coronary artery disease)    a. mild nonobstructive CAD by cath in 2010 b. NST in 11/2015 showing a fixed defect with no ischemia and Coronary CT showing nonobstructive CAD along LAD and LCx c. 08/2018: s/p NSTEMI with cath showing acute total occlusion of LCx (treated with DES) and nonobstructive CAD along RCA and LAD.   . Diabetes mellitus without complication (HCC)   . Diverticulitis   . Gallstones   . Hypertension   . IDA (iron deficiency anemia)   . MI (myocardial infarction) (HCC)   . PSVT (paroxysmal supraventricular tachycardia) (HCC)   . Restless leg syndrome   . Sleep apnea     Assessment: 70 yo with h/o CAD presents with chest pain. Pharmacy consulted to dose heparin. Stated weight 118kg. No AC PTA noted.   Goal of Therapy:  Heparin level 0.3-0.7 units/ml Monitor platelets by anticoagulation protocol: Yes   Plan:  Heparin 4000 units IV x 1 Heparin drip at 1400 units/hr Heparin level in 8 hours Heparin level and CBC daily Monitor for bleeding.   Kosha Jaquith A. Jeanella Craze, PharmD, BCPS, FNKF Clinical Pharmacist [place] Please utilize Amion for appropriate phone number to reach the unit pharmacist [facility])   10/08/2020,8:09 PM

## 2020-10-08 NOTE — ED Provider Notes (Signed)
Emergency Department Provider Note   I have reviewed the triage vital signs and the nursing notes.   HISTORY  Chief Complaint Chest Pain   HPI Russell Morrow is a 70 y.o. male with past medical history of coronary artery disease with DES placement in March 2020 presents to the emergency department with acute onset chest pain starting this morning.  Patient describes central chest pressure/heaviness with associated nausea vomiting.  He continues to be in pain.  He has had near constant pain since this morning.  He tells me that it feels identical to when he had his NSTEMI in 2020.  He did have an episode of vomiting.  He denies abdominal or back pain.  No shortness of breath.  He has been compliant with his home medications.  He did not take any nitroglycerin because he took Viagra last night at 10 PM.  Denies any fevers or chills.  No radiation of symptoms or other modifying factors.  His primary cardiologist is Dr. Wyline MoodBranch.   Past Medical History:  Diagnosis Date  . CAD (coronary artery disease)    a. mild nonobstructive CAD by cath in 2010 b. NST in 11/2015 showing a fixed defect with no ischemia and Coronary CT showing nonobstructive CAD along LAD and LCx c. 08/2018: s/p NSTEMI with cath showing acute total occlusion of LCx (treated with DES) and nonobstructive CAD along RCA and LAD.   . Diabetes mellitus without complication (HCC)   . Diverticulitis   . Gallstones   . Hypertension   . IDA (iron deficiency anemia)   . MI (myocardial infarction) (HCC)   . PSVT (paroxysmal supraventricular tachycardia) (HCC)   . Restless leg syndrome   . Sleep apnea     Patient Active Problem List   Diagnosis Date Noted  . Unstable angina (HCC) 10/08/2020  . IDA (iron deficiency anemia) 01/09/2020  . GERD (gastroesophageal reflux disease) 01/09/2020  . Abdominal pain 12/06/2019  . Hypertensive emergency 08/13/2018  . CAD (coronary artery disease) 08/13/2018  . NSTEMI (non-ST elevated  myocardial infarction) (HCC) 08/13/2018  . Atrial tachycardia (HCC)   . Hypokalemia 07/28/2018  . Hypophosphatemia 07/28/2018  . Leukocytosis 07/28/2018  . Type 2 diabetes mellitus (HCC) 07/28/2018  . Umbilical hernia 04/17/2017  . Inguinal hernia, bilateral 04/17/2017  . Cholelithiasis 04/17/2017  . Abdominal pain, epigastric 04/17/2017  . Lesion of both native kidneys 04/17/2017  . Fatty liver 04/17/2017  . Hypertension     Past Surgical History:  Procedure Laterality Date  . CERVICAL FUSION    . CHOLECYSTECTOMY N/A 01/31/2020   Procedure: LAPAROSCOPIC CHOLECYSTECTOMY;  Surgeon: Franky MachoJenkins, Mark, MD;  Location: [facility];  Service: General;  Laterality: N/A;  . CORONARY STENT INTERVENTION N/A 08/13/2018   Procedure: CORONARY STENT INTERVENTION;  Surgeon: Tonny Bollmanooper, Michael, MD;  [facility];  Service: Cardiovascular;  Laterality: N/A;  . LEFT HEART CATH AND CORONARY ANGIOGRAPHY N/A 08/13/2018   Procedure: LEFT HEART CATH AND CORONARY ANGIOGRAPHY;  Surgeon: Tonny Bollmanooper, Michael, MD;  [facility];  Service: Cardiovascular;  Laterality: N/A;  . nasal septum repair      Allergies Statins  Family History  Problem Relation Age of Onset  . Cancer Mother        breast  . Hypertension Mother   . Diabetes Father   . Heart disease Father   . Hypertension Father   . Diabetes Brother   . Heart disease Brother   . Hypertension Brother   . Colon cancer  Neg Hx     Social History Social History   Tobacco Use  . Smoking status: Never Smoker  . Smokeless tobacco: Never Used  . Tobacco comment: since age 54 - marijuana   Vaping Use  . Vaping Use: Never used  Substance Use Topics  . Alcohol use: Not Currently    Alcohol/week: 14.0 standard drinks    Types: 14 Glasses of wine per week    Comment: wine usually daily  . Drug use: Yes    Types: Marijuana    Comment: used since age 41     Review of Systems  Constitutional: No fever/chills Eyes: No visual  changes. ENT: No sore throat. Cardiovascular: Positive chest pain. Respiratory: Denies shortness of breath. Gastrointestinal: No abdominal pain. Positive nausea and vomiting.  No diarrhea.  No constipation. Genitourinary: Negative for dysuria. Musculoskeletal: Negative for back pain. Skin: Negative for rash. Neurological: Negative for headaches, focal weakness or numbness.  10-point ROS otherwise negative.  ____________________________________________   PHYSICAL EXAM:  VITAL SIGNS: ED Triage Vitals  Enc Vitals Group     BP 10/08/20 1320 (!) 182/78     Pulse Rate 10/08/20 1320 83     Resp 10/08/20 1320 18     Temp 10/08/20 1320 97.8 F (36.6 C)     Temp Source 10/08/20 1320 Oral     SpO2 10/08/20 1320 96 %   Constitutional: Alert and oriented. Well appearing and in no acute distress. Eyes: Conjunctivae are normal. Head: Atraumatic. Nose: No congestion/rhinnorhea. Mouth/Throat: Mucous membranes are moist.   Neck: No stridor.   Cardiovascular: Normal rate, regular rhythm. Good peripheral circulation. Grossly normal heart sounds.   Respiratory: Normal respiratory effort.  No retractions. Lungs CTAB. Gastrointestinal: Soft and nontender.  Specifically no epigastric or right upper quadrant tenderness. Negative Murphy's sign. No distention.  Musculoskeletal: No lower extremity tenderness nor edema. No gross deformities of extremities. Neurologic:  Normal speech and language. No gross focal neurologic deficits are appreciated.  Skin:  Skin is warm, dry and intact. No rash noted.   ____________________________________________   LABS (all labs ordered are listed, but only abnormal results are displayed)  Labs Reviewed  BASIC METABOLIC PANEL - Abnormal; Notable for the following components:      Result Value   Potassium 3.4 (*)    Glucose, Bld 222 (*)    All other components within normal limits  CBC - Abnormal; Notable for the following components:   WBC 11.4 (*)     Hemoglobin 17.3 (*)    All other components within normal limits  HEPATIC FUNCTION PANEL - Abnormal; Notable for the following components:   ALT 49 (*)    All other components within normal limits  HEMOGLOBIN A1C - Abnormal; Notable for the following components:   Hgb A1c MFr Bld 6.7 (*)    All other components within normal limits  LIPID PANEL - Abnormal; Notable for the following components:   HDL 34 (*)    All other components within normal limits  CBC - Abnormal; Notable for the following components:   WBC 12.7 (*)    All other components within normal limits  COMPREHENSIVE METABOLIC PANEL - Abnormal; Notable for the following components:   Chloride 97 (*)    Glucose, Bld 136 (*)    All other components within normal limits  HEPARIN LEVEL (UNFRACTIONATED) - Abnormal; Notable for the following components:   Heparin Unfractionated 0.11 (*)    All other components within normal limits  GLUCOSE, CAPILLARY -  Abnormal; Notable for the following components:   Glucose-Capillary 177 (*)    All other components within normal limits  CBG MONITORING, ED - Abnormal; Notable for the following components:   Glucose-Capillary 230 (*)    All other components within normal limits  SARS CORONAVIRUS 2 (TAT 6-24 HRS)  SARS CORONAVIRUS 2 BY RT PCR (HOSPITAL ORDER, PERFORMED IN [facility])  LIPASE, BLOOD  MAGNESIUM  URINALYSIS, ROUTINE W REFLEX MICROSCOPIC  HEPARIN LEVEL (UNFRACTIONATED)  TROPONIN I (HIGH SENSITIVITY)  TROPONIN I (HIGH SENSITIVITY)  TROPONIN I (HIGH SENSITIVITY)  TROPONIN I (HIGH SENSITIVITY)   ____________________________________________  EKG   EKG Interpretation  Date/Time:  Monday Oct 08 2020 14:33:12 EDT Ventricular Rate:  60 PR Interval:  178 QRS Duration: 102 QT Interval:  402 QTC Calculation: 402 R Axis:   -68 Text Interpretation: Sinus rhythm Ventricular premature complex Left atrial enlargement Abnormal R-wave progression, early transition  Inferior infarct, old Confirmed by Alona Bene [id number]) on 10/08/2020 3:00:26 PM       ____________________________________________  RADIOLOGY  DG Chest 2 View  Result Date: 10/08/2020 CLINICAL DATA:  Chest pain negative COVID test EXAM: CHEST - 2 VIEW COMPARISON:  12/05/2019 FINDINGS: Heart size and vascularity normal. Negative for heart failure. Mild elevation right hemidiaphragm mild right lower lobe atelectasis, unchanged. No acute infiltrate or effusion. IMPRESSION: Mild right lower lobe atelectasis.  No acute abnormality. Electronically Signed   By: Marlan Palau M.D.   On: 10/08/2020 13:59   CT Angio Chest PE W and/or Wo Contrast  Result Date: 10/08/2020 CLINICAL DATA:  Lower chest pain EXAM: CT ANGIOGRAPHY CHEST WITH CONTRAST TECHNIQUE: Multidetector CT imaging of the chest was performed using the standard protocol during bolus administration of intravenous contrast. Multiplanar CT image reconstructions and MIPs were obtained to evaluate the vascular anatomy. CONTRAST:  OMNIPAQUE IOHEXOL 350 MG/ML SOLN COMPARISON:  None. FINDINGS: Cardiovascular: Examination for pulmonary embolism is somewhat limited by breath motion artifact throughout. Within this limitation, no evidence of pulmonary embolism through the proximal segmental pulmonary arterial level. Cardiomegaly. Left and right coronary artery calcifications. No pericardial effusion. Aortic atherosclerosis. Mediastinum/Nodes: No enlarged mediastinal, hilar, or axillary lymph nodes. Thyroid gland, trachea, and esophagus demonstrate no significant findings. Lungs/Pleura: Bandlike scarring of the right lung base. No pleural effusion or pneumothorax. Upper Abdomen: Please see separately reported CT examination of the abdomen and pelvis. Musculoskeletal: No chest wall abnormality. No acute or significant osseous findings. Review of the MIP images confirms the above findings. IMPRESSION: 1. Examination for pulmonary embolism is somewhat limited  by breath motion artifact throughout. Within this limitation, no evidence of pulmonary embolism through the proximal segmental pulmonary arterial level. 2. Bandlike scarring of the right lung base. No acute appearing airspace opacity. 3. Cardiomegaly and coronary artery disease. Aortic Atherosclerosis (ICD10-I70.0). Electronically Signed   By: Lauralyn Primes M.D.   On: 10/08/2020 18:03   CT ABDOMEN PELVIS W CONTRAST  Result Date: 10/08/2020 CLINICAL DATA:  Severe epigastric pain EXAM: CT ABDOMEN AND PELVIS WITH CONTRAST TECHNIQUE: Multidetector CT imaging of the abdomen and pelvis was performed using the standard protocol following bolus administration of intravenous contrast. CONTRAST:  OMNIPAQUE IOHEXOL 350 MG/ML SOLN COMPARISON:  MR abdomen, 05/14/2020, CT abdomen pelvis, 12/05/2019 FINDINGS: Lower chest: No acute abnormality. Hepatobiliary: No focal liver abnormality is seen. Hepatic steatosis. Status post cholecystectomy. No biliary dilatation. Pancreas: Unremarkable. No pancreatic ductal dilatation or surrounding inflammatory changes. Spleen: Normal in size without significant abnormality. Adrenals/Urinary Tract: Adrenal glands are unremarkable.  Multiple low-attenuation lesions of the kidneys bilaterally, previously characterized as cysts and better evaluated by MRI. Kidneys are otherwise normal, without renal calculi, solid lesion, or hydronephrosis. Bladder is unremarkable. Stomach/Bowel: Stomach is within normal limits. Appendix appears normal. No evidence of bowel wall thickening, distention, or inflammatory changes. Sigmoid diverticula. Vascular/Lymphatic: Aortic atherosclerosis. No enlarged abdominal or pelvic lymph nodes. Reproductive: No mass or other significant abnormality. Other: Left greater than right fat containing bilateral inguinal hernias. No abdominopelvic ascites. Musculoskeletal: No acute or significant osseous findings. IMPRESSION: 1. No acute CT findings of the abdomen or pelvis to  explain epigastric pain. 2. Hepatic steatosis. 3. Status post cholecystectomy. 4. Sigmoid diverticulosis without evidence of acute diverticulitis. Aortic Atherosclerosis (ICD10-I70.0). Electronically Signed   By: Lauralyn Primes M.D.   On: 10/08/2020 18:08    ____________________________________________   PROCEDURES  Procedure(s) performed:   Procedures  None ____________________________________________   INITIAL IMPRESSION / ASSESSMENT AND PLAN / ED COURSE  Pertinent labs & imaging results that were available during my care of the patient were reviewed by me and considered in my medical decision making (see chart for details).   Patient presents to the emergency department with substernal chest pressure with nausea vomiting which she reports feeling the same as his prior NSTEMI.  I reviewed his cath from 2020.  DES placed successfully with no other areas of significant stenosis at that time.  He was not given nitroglycerin earlier due to taking Viagra last night.  Will give morphine and Zofran.  His abdomen is completely nontender, specifically in the epigastric region.  Given the patient's history of similar pain I am concerned for possible unstable angina.  I will discuss with cardiology.  His initial troponin is 3 and EKG does not appear particularly changed.    04:30 PM  Second troponin negative. Plan for CTA chest to evaluate for possible PE along with CT abdomen/pelvis. Paged Cardiology without response. Troponin negative.   07:40 PM  CT PE study is limited but no acute findings.  Abdomen pelvis is within normal limits.  Patient has continued pain although decreasing with morphine.  GERD is a consideration but patient very incision is feels exactly the same as his CAD from 2020.  I was able to get the cardiology team, Dr. Shari Prows, to discuss the case.  She agrees that with patient's history of similar pain and known CAD she would opt for heparin and hospitalist admission with plan to  come down to [facility] for consideration of additional provocative testing and/or left heart cath.   Discussed patient's case with [facility] to request admission. Patient and family (if present) updated with plan. Care transferred to [facility] service.  I reviewed all nursing notes, vitals, pertinent old records, EKGs, labs, imaging (as available).  ____________________________________________  FINAL CLINICAL IMPRESSION(S) / ED DIAGNOSES  Final diagnoses:  Precordial chest pain     MEDICATIONS GIVEN DURING THIS VISIT:  Medications  heparin ADULT infusion 100 units/mL (25000 units/225mL) (1,800 Units/hr Intravenous Handoff 10/09/20 0712)  pantoprazole (PROTONIX) injection 40 mg (40 mg Intravenous Not Given 10/08/20 2315)  cloNIDine (CATAPRES) tablet 0.1 mg (has no administration in time range)  ezetimibe (ZETIA) tablet 10 mg (has no administration in time range)  irbesartan (AVAPRO) tablet 300 mg (has no administration in time range)  temazepam (RESTORIL) capsule 15 mg (has no administration in time range)  pregabalin (LYRICA) capsule 100 mg (has no administration in time range)  rOPINIRole (REQUIP) tablet 2 mg (has no administration in time range)  acetaminophen (  TYLENOL) tablet 650 mg (has no administration in time range)  ondansetron (ZOFRAN) injection 4 mg (has no administration in time range)  insulin glargine (LANTUS) injection 40 Units (has no administration in time range)  insulin aspart (novoLOG) injection 0-15 Units (3 Units Subcutaneous Given 10/09/20 0853)  insulin aspart (novoLOG) injection 0-5 Units (has no administration in time range)  temazepam (RESTORIL) capsule 15 mg (15 mg Oral Given 10/09/20 0312)  metoprolol succinate (TOPROL-XL) 24 hr tablet 125 mg (has no administration in time range)  sodium chloride flush (NS) 0.9 % injection 3 mL (has no administration in time range)  sodium chloride flush (NS) 0.9 % injection 3 mL (has no administration in time range)  0.9 %  sodium chloride  infusion (has no administration in time range)  0.9% sodium chloride infusion (3 mL/kg/hr  116.2 kg Intravenous New Bag/Given 10/09/20 1000)    Followed by  0.9% sodium chloride infusion (has no administration in time range)  aspirin EC tablet 81 mg (has no administration in time range)  aspirin chewable tablet 324 mg (324 mg Oral Given by Other 10/08/20 1426)  morphine 4 MG/ML injection 4 mg (4 mg Intravenous Given 10/08/20 1508)  ondansetron (ZOFRAN) injection 4 mg (4 mg Intravenous Given 10/08/20 1508)  morphine 4 MG/ML injection 4 mg (4 mg Intravenous Given 10/08/20 1559)  iohexol (OMNIPAQUE) 350 MG/ML injection 100 mL (100 mLs Intravenous Contrast Given 10/08/20 1703)  alum & mag hydroxide-simeth (MAALOX/MYLANTA) 200-200-20 MG/5ML suspension 15 mL (15 mLs Oral Given 10/08/20 1951)  heparin bolus via infusion 4,000 Units (4,000 Units Intravenous Bolus from Bag 10/08/20 2101)  potassium chloride (KLOR-CON) packet 40 mEq (40 mEq Oral Given 10/09/20 0313)  heparin bolus via infusion 3,000 Units (3,000 Units Intravenous Bolus from Bag 10/09/20 0707)  aspirin chewable tablet 81 mg (81 mg Oral Given 10/09/20 0853)     Note:  This document was prepared using Dragon voice recognition software and may include unintentional dictation errors.  Alona Bene, MD, [facility] Emergency Medicine    Kobi Mario, Arlyss Repress, MD 10/09/20 [phone]

## 2020-10-08 NOTE — ED Notes (Addendum)
Pt dozing, O2 sats noted to drop to upper 80s then pt wakes up and sats increase to lower 90s. Pt reports he used to use CPAP but then lost 100lbs and stopped. Pt reports he has regained the 100lbs but has not started using the CPAP again. O2 @ 2lnc applied so pt can rest.

## 2020-10-08 NOTE — ED Triage Notes (Signed)
Chest pain onset 3 hours ago

## 2020-10-08 NOTE — ED Triage Notes (Signed)
States he took [place] last night

## 2020-10-08 NOTE — ED Notes (Signed)
[facility] here to transport patient, care relinquished at this time.

## 2020-10-08 NOTE — ED Triage Notes (Signed)
Emergency Medicine Provider Triage Evaluation Note  Russell Morrow , a 70 y.o. male  was evaluated in triage.  Pt complains of gradual onset, constant, sharp, substernal chest pain that began around 10-10:30 AM this morning.  Pt states this morning around 9 AM he began feeling nauseated with nonbloody nonbilious emesis.  He had eaten at [employer] about 1 hour prior to feeling nauseated.  Another hour later he began having chest pain as well as diaphoresis with some mild shortness of breath.  Patient does have a history of NSTEMI and states this feels similar.  He took a Zofran earlier today for his nausea without relief.  He has not taken anything for his chest pain.  He did take Viagra last night.   Review of Systems  Positive: + chest pain, SOB, N/V Negative: - radiation of pain  Physical Exam  BP (!) 182/78   Pulse 83   Temp 97.8 F (36.6 C) (Oral)   Resp 18   SpO2 96%  Gen:   Awake, no distress   HEENT:  Atraumatic  Resp:  Normal effort  Cardiac:  Normal rate  Abd:   Nondistended, nontender  MSK:   Moves extremities without difficulty  Neuro:  Speech clear   Medical Decision Making  Medically screening exam initiated at 1:42 PM.  Appropriate orders placed.  Russell Morrow was informed that the remainder of the evaluation will be completed by another provider, this initial triage assessment does not replace that evaluation, and the importance of remaining in the ED until their evaluation is complete.  Clinical Impression  70 year old male with a history of NSTEMI who presents to the ED today with complaints of chest pain, nausea, vomiting, shortness of breath, diaphoresis.  EKG without any acute ischemic changes at this time.  Will provide aspirin for chest pain relief.  Have advised triage nursing staff Russell Morrow that patient needs to come back to the a room as soon as possible given history of NSTEMI presentation today.    Tanda Rockers, PA-C 10/08/20 1344

## 2020-10-09 ENCOUNTER — Other Ambulatory Visit: Payer: Self-pay

## 2020-10-09 ENCOUNTER — Encounter (HOSPITAL_COMMUNITY): Payer: Self-pay | Admitting: Cardiology

## 2020-10-09 ENCOUNTER — Observation Stay (HOSPITAL_COMMUNITY): Payer: Medicare Other

## 2020-10-09 ENCOUNTER — Ambulatory Visit: Payer: Medicare Other | Admitting: Cardiology

## 2020-10-09 ENCOUNTER — Encounter (HOSPITAL_COMMUNITY)
Admission: EM | Disposition: A | Discharge status: Home / Self Care | Payer: Self-pay | Source: Home / Self Care | Attending: Cardiothoracic Surgery

## 2020-10-09 DIAGNOSIS — I200 Unstable angina: Secondary | ICD-10-CM | POA: Diagnosis not present

## 2020-10-09 DIAGNOSIS — D696 Thrombocytopenia, unspecified: Secondary | ICD-10-CM | POA: Diagnosis not present

## 2020-10-09 DIAGNOSIS — G4730 Sleep apnea, unspecified: Secondary | ICD-10-CM | POA: Diagnosis present

## 2020-10-09 DIAGNOSIS — E785 Hyperlipidemia, unspecified: Secondary | ICD-10-CM | POA: Diagnosis present

## 2020-10-09 DIAGNOSIS — I471 Supraventricular tachycardia: Secondary | ICD-10-CM | POA: Diagnosis not present

## 2020-10-09 DIAGNOSIS — E876 Hypokalemia: Secondary | ICD-10-CM | POA: Diagnosis present

## 2020-10-09 DIAGNOSIS — R079 Chest pain, unspecified: Secondary | ICD-10-CM | POA: Diagnosis present

## 2020-10-09 DIAGNOSIS — Z01810 Encounter for preprocedural cardiovascular examination: Secondary | ICD-10-CM | POA: Diagnosis not present

## 2020-10-09 DIAGNOSIS — I25110 Atherosclerotic heart disease of native coronary artery with unstable angina pectoris: Secondary | ICD-10-CM | POA: Diagnosis present

## 2020-10-09 DIAGNOSIS — I110 Hypertensive heart disease with heart failure: Secondary | ICD-10-CM | POA: Diagnosis present

## 2020-10-09 DIAGNOSIS — Z803 Family history of malignant neoplasm of breast: Secondary | ICD-10-CM | POA: Diagnosis not present

## 2020-10-09 DIAGNOSIS — G2581 Restless legs syndrome: Secondary | ICD-10-CM | POA: Diagnosis present

## 2020-10-09 DIAGNOSIS — Z20822 Contact with and (suspected) exposure to covid-19: Secondary | ICD-10-CM | POA: Diagnosis present

## 2020-10-09 DIAGNOSIS — E871 Hypo-osmolality and hyponatremia: Secondary | ICD-10-CM | POA: Diagnosis not present

## 2020-10-09 DIAGNOSIS — I493 Ventricular premature depolarization: Secondary | ICD-10-CM | POA: Diagnosis not present

## 2020-10-09 DIAGNOSIS — Z955 Presence of coronary angioplasty implant and graft: Secondary | ICD-10-CM | POA: Diagnosis not present

## 2020-10-09 DIAGNOSIS — I10 Essential (primary) hypertension: Secondary | ICD-10-CM | POA: Diagnosis not present

## 2020-10-09 DIAGNOSIS — E119 Type 2 diabetes mellitus without complications: Secondary | ICD-10-CM | POA: Diagnosis present

## 2020-10-09 DIAGNOSIS — I214 Non-ST elevation (NSTEMI) myocardial infarction: Secondary | ICD-10-CM | POA: Diagnosis not present

## 2020-10-09 DIAGNOSIS — I5032 Chronic diastolic (congestive) heart failure: Secondary | ICD-10-CM | POA: Diagnosis present

## 2020-10-09 DIAGNOSIS — K219 Gastro-esophageal reflux disease without esophagitis: Secondary | ICD-10-CM | POA: Diagnosis present

## 2020-10-09 DIAGNOSIS — Z8249 Family history of ischemic heart disease and other diseases of the circulatory system: Secondary | ICD-10-CM | POA: Diagnosis not present

## 2020-10-09 DIAGNOSIS — I252 Old myocardial infarction: Secondary | ICD-10-CM | POA: Diagnosis not present

## 2020-10-09 DIAGNOSIS — Z888 Allergy status to other drugs, medicaments and biological substances status: Secondary | ICD-10-CM | POA: Diagnosis not present

## 2020-10-09 DIAGNOSIS — Z981 Arthrodesis status: Secondary | ICD-10-CM | POA: Diagnosis not present

## 2020-10-09 DIAGNOSIS — Z833 Family history of diabetes mellitus: Secondary | ICD-10-CM | POA: Diagnosis not present

## 2020-10-09 DIAGNOSIS — D62 Acute posthemorrhagic anemia: Secondary | ICD-10-CM | POA: Diagnosis not present

## 2020-10-09 HISTORY — PX: LEFT HEART CATH AND CORONARY ANGIOGRAPHY: CATH118249

## 2020-10-09 LAB — URINALYSIS, ROUTINE W REFLEX MICROSCOPIC
Bilirubin Urine: NEGATIVE
Glucose, UA: 50 mg/dL — AB
Hgb urine dipstick: NEGATIVE
Ketones, ur: NEGATIVE mg/dL
Leukocytes,Ua: NEGATIVE
Nitrite: NEGATIVE
Protein, ur: NEGATIVE mg/dL
Specific Gravity, Urine: 1.031 — ABNORMAL HIGH (ref 1.005–1.030)
pH: 9 — ABNORMAL HIGH (ref 5.0–8.0)

## 2020-10-09 LAB — COMPREHENSIVE METABOLIC PANEL
ALT: 42 U/L (ref 0–44)
AST: 23 U/L (ref 15–41)
Albumin: 3.9 g/dL (ref 3.5–5.0)
Alkaline Phosphatase: 80 U/L (ref 38–126)
Anion gap: 10 (ref 5–15)
BUN: 17 mg/dL (ref 8–23)
CO2: 32 mmol/L (ref 22–32)
Calcium: 9.1 mg/dL (ref 8.9–10.3)
Chloride: 97 mmol/L — ABNORMAL LOW (ref 98–111)
Creatinine, Ser: 1.1 mg/dL (ref 0.61–1.24)
GFR, Estimated: >60 mL/min (ref >60)
Glucose, Bld: 136 mg/dL — ABNORMAL HIGH (ref 70–99)
Potassium: 3.8 mmol/L (ref 3.5–5.1)
Sodium: 139 mmol/L (ref 135–145)
Total Bilirubin: 0.8 mg/dL (ref 0.3–1.2)
Total Protein: 7 g/dL (ref 6.5–8.1)

## 2020-10-09 LAB — TYPE AND SCREEN
ABO/RH(D): O NEG
Antibody Screen: NEGATIVE

## 2020-10-09 LAB — CBC
HCT: 46.1 % (ref 39.0–52.0)
Hemoglobin: 16.3 g/dL (ref 13.0–17.0)
MCH: 32.9 pg (ref 26.0–34.0)
MCHC: 35.4 g/dL (ref 30.0–36.0)
MCV: 93.1 fL (ref 80.0–100.0)
Platelets: 185 10*3/uL (ref 150–400)
RBC: 4.95 MIL/uL (ref 4.22–5.81)
RDW: 13.3 % (ref 11.5–15.5)
WBC: 12.7 10*3/uL — ABNORMAL HIGH (ref 4.0–10.5)
nRBC: 0 % (ref 0.0–0.2)

## 2020-10-09 LAB — LIPID PANEL
Cholesterol: 130 mg/dL (ref 0–200)
HDL: 34 mg/dL — ABNORMAL LOW (ref >40)
LDL Cholesterol: 69 mg/dL (ref 0–99)
Total CHOL/HDL Ratio: 3.8 RATIO
Triglycerides: 133 mg/dL (ref <150)
VLDL: 27 mg/dL (ref 0–40)

## 2020-10-09 LAB — HEPARIN LEVEL (UNFRACTIONATED): Heparin Unfractionated: 0.11 IU/mL — ABNORMAL LOW (ref 0.30–0.70)

## 2020-10-09 LAB — ABO/RH: ABO/RH(D): O NEG

## 2020-10-09 LAB — GLUCOSE, CAPILLARY
Glucose-Capillary: 177 mg/dL — ABNORMAL HIGH (ref 70–99)
Glucose-Capillary: 137 mg/dL — ABNORMAL HIGH (ref 70–99)
Glucose-Capillary: 167 mg/dL — ABNORMAL HIGH (ref 70–99)
Glucose-Capillary: 172 mg/dL — ABNORMAL HIGH (ref 70–99)

## 2020-10-09 LAB — SARS CORONAVIRUS 2 BY RT PCR (HOSPITAL ORDER, PERFORMED IN ~~LOC~~ HOSPITAL LAB): SARS Coronavirus 2: NEGATIVE

## 2020-10-09 LAB — MAGNESIUM: Magnesium: 2 mg/dL (ref 1.7–2.4)

## 2020-10-09 LAB — HEMOGLOBIN A1C
Hgb A1c MFr Bld: 6.7 % — ABNORMAL HIGH (ref 4.8–5.6)
Mean Plasma Glucose: 145.59 mg/dL

## 2020-10-09 LAB — SURGICAL PCR SCREEN
MRSA, PCR: NEGATIVE
Staphylococcus aureus: NEGATIVE

## 2020-10-09 LAB — SARS CORONAVIRUS 2 (TAT 6-24 HRS): SARS Coronavirus 2: NEGATIVE

## 2020-10-09 SURGERY — LEFT HEART CATH AND CORONARY ANGIOGRAPHY
Anesthesia: LOCAL

## 2020-10-09 MED ORDER — EPINEPHRINE HCL 5 MG/250ML IV SOLN IN NS
0.0000 ug/min | INTRAVENOUS | Status: DC
  Filled 2020-10-09: qty 250

## 2020-10-09 MED ORDER — IRBESARTAN 150 MG PO TABS
300.0000 mg | ORAL_TABLET | Freq: Every day | ORAL | Status: DC
  Administered 2020-10-09: 300 mg via ORAL
  Filled 2020-10-09: qty 2

## 2020-10-09 MED ORDER — SODIUM CHLORIDE 0.9% FLUSH: 3.0000 mL | INTRAVENOUS | Status: DC | PRN

## 2020-10-09 MED ORDER — HEPARIN (PORCINE) IN NACL 1000-0.9 UT/500ML-% IV SOLN
INTRAVENOUS | Status: AC
  Filled 2020-10-09: qty 1000

## 2020-10-09 MED ORDER — SODIUM CHLORIDE 0.9% FLUSH: 3.0000 mL | Freq: Two times a day (BID) | INTRAVENOUS | Status: DC

## 2020-10-09 MED ORDER — ASPIRIN 81 MG PO CHEW
81.0000 mg | CHEWABLE_TABLET | ORAL | Status: AC
  Administered 2020-10-09: 81 mg via ORAL
  Filled 2020-10-09: qty 1

## 2020-10-09 MED ORDER — METOPROLOL TARTRATE 12.5 MG HALF TABLET
12.5000 mg | ORAL_TABLET | Freq: Once | ORAL | Status: AC
  Administered 2020-10-10: 12.5 mg via ORAL
  Filled 2020-10-09: qty 1

## 2020-10-09 MED ORDER — NITROGLYCERIN IN D5W 200-5 MCG/ML-% IV SOLN
2.0000 ug/min | INTRAVENOUS | Status: AC
  Administered 2020-10-10: 16.6 ug/min via INTRAVENOUS
  Filled 2020-10-09: qty 250

## 2020-10-09 MED ORDER — POTASSIUM CHLORIDE 2 MEQ/ML IV SOLN
80.0000 meq | INTRAVENOUS | Status: DC
  Filled 2020-10-09: qty 40

## 2020-10-09 MED ORDER — MIDAZOLAM HCL 2 MG/2ML IJ SOLN
INTRAMUSCULAR | Status: AC
  Filled 2020-10-09: qty 2

## 2020-10-09 MED ORDER — PLASMA-LYTE 148 IV SOLN
INTRAVENOUS | Status: DC
  Filled 2020-10-09: qty 2.5

## 2020-10-09 MED ORDER — ROPINIROLE HCL 0.5 MG PO TABS
2.0000 mg | ORAL_TABLET | Freq: Every day | ORAL | Status: DC
  Administered 2020-10-09: 2 mg via ORAL
  Filled 2020-10-09: qty 4

## 2020-10-09 MED ORDER — INSULIN GLARGINE 100 UNIT/ML ~~LOC~~ SOLN
40.0000 [IU] | Freq: Every day | SUBCUTANEOUS | Status: DC
  Administered 2020-10-09: 40 [IU] via SUBCUTANEOUS
  Filled 2020-10-09 (×2): qty 0.4

## 2020-10-09 MED ORDER — CEFAZOLIN SODIUM-DEXTROSE 2-4 GM/100ML-% IV SOLN
2.0000 g | INTRAVENOUS | Status: AC
  Administered 2020-10-10 (×2): 2 g via INTRAVENOUS
  Filled 2020-10-09: qty 100

## 2020-10-09 MED ORDER — CEFAZOLIN SODIUM-DEXTROSE 2-4 GM/100ML-% IV SOLN
2.0000 g | INTRAVENOUS | Status: DC
  Filled 2020-10-09: qty 100

## 2020-10-09 MED ORDER — CHLORHEXIDINE GLUCONATE 0.12 % MT SOLN
15.0000 mL | Freq: Once | OROMUCOSAL | Status: AC
  Administered 2020-10-10: 15 mL via OROMUCOSAL
  Filled 2020-10-09: qty 15

## 2020-10-09 MED ORDER — ACETAMINOPHEN 325 MG PO TABS: 650.0000 mg | ORAL_TABLET | ORAL | Status: DC | PRN

## 2020-10-09 MED ORDER — INSULIN ASPART 100 UNIT/ML IJ SOLN: 0.0000 [IU] | Freq: Every day | INTRAMUSCULAR | Status: DC

## 2020-10-09 MED ORDER — PHENYLEPHRINE HCL-NACL 20-0.9 MG/250ML-% IV SOLN
30.0000 ug/min | INTRAVENOUS | Status: AC
  Administered 2020-10-10: 50 ug/min via INTRAVENOUS
  Filled 2020-10-09: qty 250

## 2020-10-09 MED ORDER — VERAPAMIL HCL 2.5 MG/ML IV SOLN
INTRAVENOUS | Status: DC | PRN
  Administered 2020-10-09: 10 mL via INTRA_ARTERIAL

## 2020-10-09 MED ORDER — MILRINONE LACTATE IN DEXTROSE 20-5 MG/100ML-% IV SOLN
0.3000 ug/kg/min | INTRAVENOUS | Status: DC
  Filled 2020-10-09: qty 100

## 2020-10-09 MED ORDER — LIDOCAINE HCL (PF) 1 % IJ SOLN
INTRAMUSCULAR | Status: DC | PRN
  Administered 2020-10-09: 2 mL

## 2020-10-09 MED ORDER — CHLORHEXIDINE GLUCONATE CLOTH 2 % EX PADS
6.0000 | MEDICATED_PAD | Freq: Once | CUTANEOUS | Status: AC
  Administered 2020-10-09: 6 via TOPICAL

## 2020-10-09 MED ORDER — TRANEXAMIC ACID (OHS) PUMP PRIME SOLUTION
2.0000 mg/kg | INTRAVENOUS | Status: DC
  Filled 2020-10-09: qty 2.32

## 2020-10-09 MED ORDER — CLONIDINE HCL 0.1 MG PO TABS
0.1000 mg | ORAL_TABLET | Freq: Two times a day (BID) | ORAL | Status: DC
  Filled 2020-10-09: qty 1

## 2020-10-09 MED ORDER — SODIUM CHLORIDE 0.9 % WEIGHT BASED INFUSION
3.0000 mL/kg/h | INTRAVENOUS | Status: DC
  Administered 2020-10-09: 3 mL/kg/h via INTRAVENOUS

## 2020-10-09 MED ORDER — TEMAZEPAM 15 MG PO CAPS
15.0000 mg | ORAL_CAPSULE | Freq: Every day | ORAL | Status: DC
  Administered 2020-10-09: 15 mg via ORAL
  Filled 2020-10-09: qty 1

## 2020-10-09 MED ORDER — TRANEXAMIC ACID (OHS) BOLUS VIA INFUSION
15.0000 mg/kg | INTRAVENOUS | Status: AC
  Administered 2020-10-10: 1743 mg via INTRAVENOUS
  Filled 2020-10-09: qty 1743

## 2020-10-09 MED ORDER — SODIUM CHLORIDE 0.9 % IV SOLN
INTRAVENOUS | Status: DC
  Filled 2020-10-09: qty 30

## 2020-10-09 MED ORDER — FENTANYL CITRATE (PF) 100 MCG/2ML IJ SOLN
INTRAMUSCULAR | Status: DC | PRN
  Administered 2020-10-09: 25 ug via INTRAVENOUS

## 2020-10-09 MED ORDER — CHLORHEXIDINE GLUCONATE CLOTH 2 % EX PADS
6.0000 | MEDICATED_PAD | Freq: Once | CUTANEOUS | Status: AC
  Administered 2020-10-10: 6 via TOPICAL

## 2020-10-09 MED ORDER — METOPROLOL SUCCINATE ER 25 MG PO TB24
125.0000 mg | ORAL_TABLET | Freq: Every day | ORAL | Status: DC
  Administered 2020-10-09: 125 mg via ORAL
  Filled 2020-10-09: qty 1

## 2020-10-09 MED ORDER — ASPIRIN EC 81 MG PO TBEC: 81.0000 mg | DELAYED_RELEASE_TABLET | Freq: Every day | ORAL | Status: DC

## 2020-10-09 MED ORDER — SODIUM CHLORIDE 0.9 % IV SOLN: 250.0000 mL | INTRAVENOUS | Status: DC | PRN

## 2020-10-09 MED ORDER — DEXMEDETOMIDINE HCL IN NACL 400 MCG/100ML IV SOLN
0.1000 ug/kg/h | INTRAVENOUS | Status: AC
  Administered 2020-10-10: .7 ug/kg/h via INTRAVENOUS
  Filled 2020-10-09: qty 100

## 2020-10-09 MED ORDER — EZETIMIBE 10 MG PO TABS
10.0000 mg | ORAL_TABLET | Freq: Every day | ORAL | Status: DC
  Administered 2020-10-09 – 2020-10-15 (×6): 10 mg via ORAL
  Filled 2020-10-09 (×6): qty 1

## 2020-10-09 MED ORDER — LIDOCAINE HCL (PF) 1 % IJ SOLN
INTRAMUSCULAR | Status: AC
  Filled 2020-10-09: qty 30

## 2020-10-09 MED ORDER — VERAPAMIL HCL 2.5 MG/ML IV SOLN
INTRAVENOUS | Status: AC
  Filled 2020-10-09: qty 2

## 2020-10-09 MED ORDER — HEPARIN BOLUS VIA INFUSION
3000.0000 [IU] | Freq: Once | INTRAVENOUS | Status: AC
  Administered 2020-10-09: 3000 [IU] via INTRAVENOUS
  Filled 2020-10-09: qty 3000

## 2020-10-09 MED ORDER — TEMAZEPAM 15 MG PO CAPS: 15.0000 mg | ORAL_CAPSULE | Freq: Once | ORAL | Status: DC | PRN

## 2020-10-09 MED ORDER — PREGABALIN 100 MG PO CAPS
100.0000 mg | ORAL_CAPSULE | Freq: Every day | ORAL | Status: DC
  Administered 2020-10-09: 100 mg via ORAL
  Filled 2020-10-09: qty 1

## 2020-10-09 MED ORDER — INSULIN REGULAR(HUMAN) IN NACL 100-0.9 UT/100ML-% IV SOLN
INTRAVENOUS | Status: AC
  Administered 2020-10-10: 7 [IU]/h via INTRAVENOUS
  Filled 2020-10-09: qty 100

## 2020-10-09 MED ORDER — HEPARIN SODIUM (PORCINE) 1000 UNIT/ML IJ SOLN
INTRAMUSCULAR | Status: DC | PRN
  Administered 2020-10-09: 5500 [IU] via INTRAVENOUS

## 2020-10-09 MED ORDER — POTASSIUM CHLORIDE 10 MEQ/100ML IV SOLN: 10.0000 meq | INTRAVENOUS | Status: DC

## 2020-10-09 MED ORDER — HEPARIN (PORCINE) IN NACL 1000-0.9 UT/500ML-% IV SOLN
INTRAVENOUS | Status: DC | PRN
  Administered 2020-10-09 (×4): 500 mL

## 2020-10-09 MED ORDER — MAGNESIUM SULFATE 50 % IJ SOLN
40.0000 meq | INTRAMUSCULAR | Status: DC
  Filled 2020-10-09: qty 9.85

## 2020-10-09 MED ORDER — INSULIN ASPART 100 UNIT/ML IJ SOLN
0.0000 [IU] | Freq: Three times a day (TID) | INTRAMUSCULAR | Status: DC
  Administered 2020-10-09: 2 [IU] via SUBCUTANEOUS
  Administered 2020-10-09 (×2): 3 [IU] via SUBCUTANEOUS

## 2020-10-09 MED ORDER — IOHEXOL 350 MG/ML SOLN
INTRAVENOUS | Status: DC | PRN
  Administered 2020-10-09: 100 mL

## 2020-10-09 MED ORDER — HEPARIN (PORCINE) 25000 UT/250ML-% IV SOLN: 1800.0000 [IU]/h | INTRAVENOUS | Status: DC

## 2020-10-09 MED ORDER — FENTANYL CITRATE (PF) 100 MCG/2ML IJ SOLN
INTRAMUSCULAR | Status: AC
  Filled 2020-10-09: qty 2

## 2020-10-09 MED ORDER — POTASSIUM CHLORIDE 20 MEQ PO PACK
40.0000 meq | PACK | Freq: Once | ORAL | Status: AC
  Administered 2020-10-09: 40 meq via ORAL
  Filled 2020-10-09: qty 2

## 2020-10-09 MED ORDER — METOPROLOL SUCCINATE ER 25 MG PO TB24: 25.0000 mg | ORAL_TABLET | Freq: Every day | ORAL | Status: DC

## 2020-10-09 MED ORDER — TRANEXAMIC ACID 1000 MG/10ML IV SOLN
1.5000 mg/kg/h | INTRAVENOUS | Status: AC
  Administered 2020-10-10: 1.5 mg/kg/h via INTRAVENOUS
  Filled 2020-10-09: qty 25

## 2020-10-09 MED ORDER — SODIUM CHLORIDE 0.9 % WEIGHT BASED INFUSION
1.0000 mL/kg/h | INTRAVENOUS | Status: AC
  Administered 2020-10-09: 1 mL/kg/h via INTRAVENOUS

## 2020-10-09 MED ORDER — TEMAZEPAM 15 MG PO CAPS
15.0000 mg | ORAL_CAPSULE | Freq: Every evening | ORAL | Status: DC | PRN
  Administered 2020-10-09: 15 mg via ORAL
  Filled 2020-10-09: qty 1

## 2020-10-09 MED ORDER — VANCOMYCIN HCL 1500 MG/300ML IV SOLN
1500.0000 mg | INTRAVENOUS | Status: AC
  Administered 2020-10-10: 1500 mg via INTRAVENOUS
  Filled 2020-10-09: qty 300

## 2020-10-09 MED ORDER — BISACODYL 5 MG PO TBEC
5.0000 mg | DELAYED_RELEASE_TABLET | Freq: Once | ORAL | Status: AC
  Administered 2020-10-09: 5 mg via ORAL
  Filled 2020-10-09: qty 1

## 2020-10-09 MED ORDER — MIDAZOLAM HCL 2 MG/2ML IJ SOLN
INTRAMUSCULAR | Status: DC | PRN
  Administered 2020-10-09: 1 mg via INTRAVENOUS

## 2020-10-09 MED ORDER — SODIUM CHLORIDE 0.9 % WEIGHT BASED INFUSION: 1.0000 mL/kg/h | INTRAVENOUS | Status: DC

## 2020-10-09 MED ORDER — NOREPINEPHRINE 4 MG/250ML-% IV SOLN
0.0000 ug/min | INTRAVENOUS | Status: DC
Start: 2020-10-10 — End: 2020-10-10
  Filled 2020-10-09: qty 250

## 2020-10-09 MED ORDER — METOPROLOL SUCCINATE ER 100 MG PO TB24: 100.0000 mg | ORAL_TABLET | Freq: Every day | ORAL | Status: DC

## 2020-10-09 MED ORDER — HEPARIN SODIUM (PORCINE) 1000 UNIT/ML IJ SOLN
INTRAMUSCULAR | Status: AC
  Filled 2020-10-09: qty 1

## 2020-10-09 MED ORDER — ONDANSETRON HCL 4 MG/2ML IJ SOLN: 4.0000 mg | Freq: Four times a day (QID) | INTRAMUSCULAR | Status: DC | PRN

## 2020-10-09 SURGICAL SUPPLY — 9 items
CATH 5FR JL3.5 JR4 ANG PIG MP (CATHETERS) ×2 IMPLANT
DEVICE RAD COMP TR BAND LRG (VASCULAR PRODUCTS) ×2 IMPLANT
GLIDESHEATH SLEND SS 6F .021 (SHEATH) ×2 IMPLANT
GUIDEWIRE INQWIRE 1.5J.035X260 (WIRE) ×1 IMPLANT
INQWIRE 1.5J .035X260CM (WIRE) ×2
KIT HEART LEFT (KITS) ×2 IMPLANT
PACK CARDIAC CATHETERIZATION (CUSTOM PROCEDURE TRAY) ×2 IMPLANT
TRANSDUCER W/STOPCOCK (MISCELLANEOUS) ×2 IMPLANT
TUBING CIL FLEX 10 FLL-RA (TUBING) ×2 IMPLANT

## 2020-10-09 NOTE — H&P (View-Only) (Signed)
Cardiology Consult    Patient ID: Russell Morrow MRN: [id number], DOB/AGE: [birth year]   Admit date: 10/08/2020 Date of Consult: 10/09/2020  Primary Physician: Benita Stabile, MD Primary Cardiologist: Dina Rich, MD Requesting Provider: Dr. Carren Rang  Patient Profile    Russell Morrow is a 70 y.o. male with a history of CAD, HFpEF, HTN, AT, hyperlipidemia, T2DM and statin intolerance presenting with chest pain.  History of Present Illness    70 year old male wit history as above who presented with chest pain today that was centrall located, constant, and sharp.  He tried some maalox to improve the pain but this doesn't help.  He had associated vomiting this morning, 3-4 times, but no significant nausea at this time.  He also has associated shortness of breath.  No eorthopnea, PND, palpitations, or worsening edema.  He presented to outside emergency department with unremarkable vitals and labs, troponin 3,6,15 on repeated measurements, and a CT scan did not show any pulmonary embolism or acute changes in the chest.  No nitroglycerin was trialed given recent use of cialis within 24 hours.  HIs pain was treated with morphine.  He was started on a heparin infusion and transferred to our institution.  Pertinent prior CV history includes CAD with 08/2018 LHC at which time he had thrombotic proximal LCx occlusion and underwent PCI with residual nonobstructive ostial LAD disease.  He was having chest pain subsequently and had an MPI in July of 2021 that showed moderate inferior ischemia.  He subsequently had a laparoscopic cholecystectomy and his pain resolved; this was managed medically accordingly.  Home CV meds include Aspirin 81 mg dialy Clonidine 0.1 mg BID Ezetimibe 10 mg daily Furosemide 40 mg daily Insulin Metoprolol succinate 100 mg daily Olmesartan 40 mg daily Spironolactone 25 mg daily Dulaglutide  ECG personally reviewed and shows normal sinus rhythm with an old inferior infarct  and non-specific ST changes.  It is not significantly changed from prior.  Past Medical History   Past Medical History:  Diagnosis Date  . CAD (coronary artery disease)    a. mild nonobstructive CAD by cath in 2010 b. NST in 11/2015 showing a fixed defect with no ischemia and Coronary CT showing nonobstructive CAD along LAD and LCx c. 08/2018: s/p NSTEMI with cath showing acute total occlusion of LCx (treated with DES) and nonobstructive CAD along RCA and LAD.   . Diabetes mellitus without complication (HCC)   . Diverticulitis   . Gallstones   . Hypertension   . IDA (iron deficiency anemia)   . MI (myocardial infarction) (HCC)   . PSVT (paroxysmal supraventricular tachycardia) (HCC)   . Restless leg syndrome   . Sleep apnea     Past Surgical History:  Procedure Laterality Date  . CERVICAL FUSION    . CHOLECYSTECTOMY N/A 01/31/2020   Procedure: LAPAROSCOPIC CHOLECYSTECTOMY;  Surgeon: Franky Macho, MD;  Location: [facility];  Service: General;  Laterality: N/A;  . CORONARY STENT INTERVENTION N/A 08/13/2018   Procedure: CORONARY STENT INTERVENTION;  Surgeon: Tonny Bollman, MD;  Location: [facility] INVASIVE CV LAB;  Service: Cardiovascular;  Laterality: N/A;  . LEFT HEART CATH AND CORONARY ANGIOGRAPHY N/A 08/13/2018   Procedure: LEFT HEART CATH AND CORONARY ANGIOGRAPHY;  Surgeon: Tonny Bollman, MD;  Location: [facility] INVASIVE CV LAB;  Service: Cardiovascular;  Laterality: N/A;  . nasal septum repair       Allergies  Allergen Reactions  . Statins     Muscle aches, liver pain; Tried Atorvastatin, Crestor,  Pravastatin, and Simvastatin   Inpatient Medications    . aspirin EC  81 mg Oral Daily  . cloNIDine  0.1 mg Oral BID  . ezetimibe  10 mg Oral Daily  . insulin aspart  0-15 Units Subcutaneous TID WC  . insulin aspart  0-5 Units Subcutaneous QHS  . insulin glargine  40 Units Subcutaneous QHS  . irbesartan  300 mg Oral Daily  . metoprolol succinate  125 mg Oral Daily  . pantoprazole (PROTONIX)  IV  40 mg Intravenous Q24H  . pregabalin  100 mg Oral QHS  . rOPINIRole  2 mg Oral QHS  . temazepam  15 mg Oral QHS    Family History    Family History  Problem Relation Age of Onset  . Cancer Mother        breast  . Hypertension Mother   . Diabetes Father   . Heart disease Father   . Hypertension Father   . Diabetes Brother   . Heart disease Brother   . Hypertension Brother   . Colon cancer Neg Hx    He indicated that his mother is alive. He indicated that his father is deceased. He indicated that the status of his brother is unknown. He indicated that the status of his neg hx is unknown.   Social History    Social History   Socioeconomic History  . Marital status: Married    Spouse name: Not on file  . Number of children: Not on file  . Years of education: Not on file  . Highest education level: Not on file  Occupational History  . Not on file  Tobacco Use  . Smoking status: Never Smoker  . Smokeless tobacco: Never Used  . Tobacco comment: since age 51 - marijuana   Vaping Use  . Vaping Use: Never used  Substance and Sexual Activity  . Alcohol use: Not Currently    Alcohol/week: 14.0 standard drinks    Types: 14 Glasses of wine per week    Comment: wine usually daily  . Drug use: Yes    Types: Marijuana    Comment: used since age 78   . Sexual activity: Not on file  Other Topics Concern  . Not on file  Social History Narrative  . Not on file   Social Determinants of Health   Financial Resource Strain: Not on file  Food Insecurity: No Food Insecurity  . Worried About Programme researcher, broadcasting/film/video in the Last Year: Never true  . Ran Out of Food in the Last Year: Never true  Transportation Needs: No Transportation Needs  . Lack of Transportation (Medical): No  . Lack of Transportation (Non-Medical): No  Physical Activity: Not on file  Stress: Not on file  Social Connections: Not on file  Intimate Partner Violence: Not on file     Review of Systems     General:  No chills, fever, night sweats or weight changes.  Cardiovascular:  No chest pain, dyspnea on exertion, edema, orthopnea, palpitations, paroxysmal nocturnal dyspnea. Dermatological: No rash, lesions/masses Respiratory: No cough, dyspnea Urologic: No hematuria, dysuria Abdominal:   No nausea, vomiting, diarrhea, bright red blood per rectum, melena, or hematemesis Neurologic:  No visual changes, wkns, changes in mental status. All other systems reviewed and are otherwise negative except as noted above.  Physical Exam    Blood pressure (!) 152/81, pulse 84, temperature 98.5 F (36.9 C), temperature source Oral, resp. rate 15, height 6\' 2"  (1.88 m), weight 116.2 kg,  SpO2 96 %.     Intake/Output Summary (Last 24 hours) at 10/09/2020 0541 Last data filed at 10/09/2020 0327 Gross per 24 hour  Intake --  Output 250 ml  Net -250 ml   Wt Readings from Last 3 Encounters:  10/09/20 116.2 kg  07/09/20 119.2 kg  06/18/20 119.7 kg    CONSTITUTIONAL: alert and conversant, well-appearing, nourished, no distress HEENT: oropharynx clear and moist, no mucosal lesions, normal dentition, conjunctiva normal, EOM intact, pupils equal, no lid lag. NECK: supple, no cervical adenopathy, no thyromegaly CARDIOVASCULAR: Regular rhythm. No gallop, murmur, or rub. Normal S1/S2. Radial pulses intact. JVP flat. No carotid bruits. PULMONARY/CHEST WALL: no deformities, normal breath sounds bilaterally, normal work of breathing ABDOMINAL: soft, non-tender, non-distended EXTREMITIES: no edema or muscle atrophy, warm and well-perfused SKIN: Dry and intact without apparent rashes or wounds. NEUROLOGIC: alert, normal gait, no abnormal movements, cranial nerves grossly intact.   Labs    Troponin [facility](Point of Care Test) Troponin 3, 6, 16 Lab Results  Component Value Date   WBC 11.4 (H) 10/08/2020   HGB 17.3 (H) 10/08/2020   HCT 49.7 10/08/2020   MCV 94.5 10/08/2020   PLT 171 10/08/2020    Recent Labs   Lab 10/08/20 1342 10/08/20 2154  NA 138  --   K 3.4*  --   CL 98  --   CO2 28  --   BUN 22  --   CREATININE 0.93  --   CALCIUM 9.4  --   PROT  --  7.3  BILITOT  --  1.1  ALKPHOS  --  84  ALT  --  49*  AST  --  26  GLUCOSE 222*  --    Lab Results  Component Value Date   CHOL 143 08/14/2018   HDL 26 (L) 08/14/2018   LDLCALC 99 08/14/2018   TRIG 91 08/14/2018   No results found for: [facility]   Radiology Studies    DG Chest 2 View  Result Date: 10/08/2020 CLINICAL DATA:  Chest pain negative COVID test EXAM: CHEST - 2 VIEW COMPARISON:  12/05/2019 FINDINGS: Heart size and vascularity normal. Negative for heart failure. Mild elevation right hemidiaphragm mild right lower lobe atelectasis, unchanged. No acute infiltrate or effusion. IMPRESSION: Mild right lower lobe atelectasis.  No acute abnormality. Electronically Signed   By: Marlan Palauharles  Clark M.D.   On: 10/08/2020 13:59   CT Angio Chest PE W and/or Wo Contrast  Result Date: 10/08/2020 CLINICAL DATA:  Lower chest pain EXAM: CT ANGIOGRAPHY CHEST WITH CONTRAST TECHNIQUE: Multidetector CT imaging of the chest was performed using the standard protocol during bolus administration of intravenous contrast. Multiplanar CT image reconstructions and MIPs were obtained to evaluate the vascular anatomy. CONTRAST:  100mL OMNIPAQUE IOHEXOL 350 MG/ML SOLN COMPARISON:  None. FINDINGS: Cardiovascular: Examination for pulmonary embolism is somewhat limited by breath motion artifact throughout. Within this limitation, no evidence of pulmonary embolism through the proximal segmental pulmonary arterial level. Cardiomegaly. Left and right coronary artery calcifications. No pericardial effusion. Aortic atherosclerosis. Mediastinum/Nodes: No enlarged mediastinal, hilar, or axillary lymph nodes. Thyroid gland, trachea, and esophagus demonstrate no significant findings. Lungs/Pleura: Bandlike scarring of the right lung base. No pleural effusion or pneumothorax. Upper  Abdomen: Please see separately reported CT examination of the abdomen and pelvis. Musculoskeletal: No chest wall abnormality. No acute or significant osseous findings. Review of the MIP images confirms the above findings. IMPRESSION: 1. Examination for pulmonary embolism is somewhat limited by breath motion artifact throughout. Within  this limitation, no evidence of pulmonary embolism through the proximal segmental pulmonary arterial level. 2. Bandlike scarring of the right lung base. No acute appearing airspace opacity. 3. Cardiomegaly and coronary artery disease. Aortic Atherosclerosis (ICD10-I70.0). Electronically Signed   By: Alex  Bibbey M.D.   On: 10/08/2020 18:03   CT ABDOMEN PELVIS W CONTRAST  Result Date: 10/08/2020 CLINICAL DATA:  Severe epigastric pain EXAM: CT ABDOMEN AND PELVIS WITH CONTRAST TECHNIQUE: Multidetector CT imaging of the abdomen and pelvis was performed using the standard protocol following bolus administration of intravenous contrast. CONTRAST:  100mL OMNIPAQUE IOHEXOL 350 MG/ML SOLN COMPARISON:  MR abdomen, 05/14/2020, CT abdomen pelvis, 12/05/2019 FINDINGS: Lower chest: No acute abnormality. Hepatobiliary: No focal liver abnormality is seen. Hepatic steatosis. Status post cholecystectomy. No biliary dilatation. Pancreas: Unremarkable. No pancreatic ductal dilatation or surrounding inflammatory changes. Spleen: Normal in size without significant abnormality. Adrenals/Urinary Tract: Adrenal glands are unremarkable. Multiple low-attenuation lesions of the kidneys bilaterally, previously characterized as cysts and better evaluated by MRI. Kidneys are otherwise normal, without renal calculi, solid lesion, or hydronephrosis. Bladder is unremarkable. Stomach/Bowel: Stomach is within normal limits. Appendix appears normal. No evidence of bowel wall thickening, distention, or inflammatory changes. Sigmoid diverticula. Vascular/Lymphatic: Aortic atherosclerosis. No enlarged abdominal or pelvic  lymph nodes. Reproductive: No mass or other significant abnormality. Other: Left greater than right fat containing bilateral inguinal hernias. No abdominopelvic ascites. Musculoskeletal: No acute or significant osseous findings. IMPRESSION: 1. No acute CT findings of the abdomen or pelvis to explain epigastric pain. 2. Hepatic steatosis. 3. Status post cholecystectomy. 4. Sigmoid diverticulosis without evidence of acute diverticulitis. Aortic Atherosclerosis (ICD10-I70.0). Electronically Signed   By: Alex  Bibbey M.D.   On: 10/08/2020 18:08    ECG & Cardiac Imaging    TTE 06/20/20 1. Left ventricular ejection fraction, by estimation, is 60 to 65%. The  left ventricle has normal function. The left ventricle has no regional  wall motion abnormalities. There is moderate left ventricular hypertrophy.  Left ventricular diastolic  parameters are indeterminate. Elevated left atrial pressure.  2. Right ventricular systolic function is normal. The right ventricular  size is normal.  3. The mitral valve is normal in structure. Trivial mitral valve  regurgitation. No evidence of mitral stenosis.  4. The aortic valve is tricuspid. There is mild calcification of the  aortic valve. There is mild thickening of the aortic valve. Aortic valve  regurgitation is not visualized. No aortic stenosis is present.  5. Aortic dilatation noted. There is mild dilatation of the ascending  aorta, measuring 40 mm.  6. The inferior vena cava is normal in size with greater than 50%  respiratory variability, suggesting right atrial pressure of 3 mmHg.   Assessment & Plan    69 year old male with obesity, hypertension, hyperlipidemia, diabetes, statin intolerance, known CAD who presents with chest pain.  He describes typical sounding chest pain that is similar to prior presentations.  ECG is unchanged and troponin is borderline positive at 15 yesterday evening from 3 at presentation.  Overall sounds concerning for  NSTEMI/ACS.  Discussed with patient and he would prefer an invasive approach.  Please keep NPO for left heart cathterization and manage medically until that time.  Agree with holding off on nitroglycerin until tadalafil has washed out.  #NSTEMI/ACS #CAD - ASA 81 mg daily s/p 325 mg ASA - Heparin infusion - Continue home BB, ARB - Statin intolerant and high intensity statin deferred; continue ezetimibe; would benefit from PCSK9 long term - NPO for   LHC today - TTE; update echocardiogram  #HTN - Continue metoprolol, clonidine, olmesartan, spironolactone  #T2DM - Recommend SGLT2i  #Chronic diastolic heart failure - Continue home furosemide; appears euvolemic on my evaluation.  Signed, Regino Schultze, MD 10/09/2020, 5:41 AM  For questions or updates, please contact   Please consult [email or web address] for contact info under Cardiology/STEMI.

## 2020-10-09 NOTE — Progress Notes (Signed)
ANTICOAGULATION CONSULT NOTE - Follow Up Consult  Pharmacy Consult for heparin Indication: chest pain/ACS  Labs: Recent Labs    10/08/20 1342 10/08/20 1523 10/08/20 1954 10/08/20 2154  HGB 17.3*  --   --   --   HCT 49.7  --   --   --   PLT 171  --   --   --   CREATININE 0.93  --   --   --   TROPONINIHS 3 3 6 15     Assessment: 69yo male subtherapeutic on heparin with initial dosing for ACS; no gtt issues or signs of bleeding per RN.  Goal of Therapy:  Heparin level 0.3-0.7 units/ml   Plan:  Will rebolus with heparin 3000 units and increase heparin gtt by 4 units/kg/hr to 1800 units/hr and check level in 6 hours.    , PharmD, BCPS  10/09/2020,6:55 AM

## 2020-10-09 NOTE — Consult Note (Signed)
Cardiology Consult    Patient ID: Russell Morrow MRN: [id number], DOB/AGE: [birth year]   Admit date: 10/08/2020 Date of Consult: 10/09/2020  Primary Physician: Benita Stabile, MD Primary Cardiologist: Dina Rich, MD Requesting Provider: Dr. Carren Rang  Patient Profile    Russell Morrow is a 70 y.o. male with a history of CAD, HFpEF, HTN, AT, hyperlipidemia, T2DM and statin intolerance presenting with chest pain.  History of Present Illness    70 year old male wit history as above who presented with chest pain today that was centrall located, constant, and sharp.  He tried some maalox to improve the pain but this doesn't help.  He had associated vomiting this morning, 3-4 times, but no significant nausea at this time.  He also has associated shortness of breath.  No eorthopnea, PND, palpitations, or worsening edema.  He presented to outside emergency department with unremarkable vitals and labs, troponin 3,6,15 on repeated measurements, and a CT scan did not show any pulmonary embolism or acute changes in the chest.  No nitroglycerin was trialed given recent use of cialis within 24 hours.  HIs pain was treated with morphine.  He was started on a heparin infusion and transferred to our institution.  Pertinent prior CV history includes CAD with 08/2018 LHC at which time he had thrombotic proximal LCx occlusion and underwent PCI with residual nonobstructive ostial LAD disease.  He was having chest pain subsequently and had an MPI in July of 2021 that showed moderate inferior ischemia.  He subsequently had a laparoscopic cholecystectomy and his pain resolved; this was managed medically accordingly.  Home CV meds include Aspirin 81 mg dialy Clonidine 0.1 mg BID Ezetimibe 10 mg daily Furosemide 40 mg daily Insulin Metoprolol succinate 100 mg daily Olmesartan 40 mg daily Spironolactone 25 mg daily Dulaglutide  ECG personally reviewed and shows normal sinus rhythm with an old inferior infarct  and non-specific ST changes.  It is not significantly changed from prior.  Past Medical History   Past Medical History:  Diagnosis Date  . CAD (coronary artery disease)    a. mild nonobstructive CAD by cath in 2010 b. NST in 11/2015 showing a fixed defect with no ischemia and Coronary CT showing nonobstructive CAD along LAD and LCx c. 08/2018: s/p NSTEMI with cath showing acute total occlusion of LCx (treated with DES) and nonobstructive CAD along RCA and LAD.   . Diabetes mellitus without complication (HCC)   . Diverticulitis   . Gallstones   . Hypertension   . IDA (iron deficiency anemia)   . MI (myocardial infarction) (HCC)   . PSVT (paroxysmal supraventricular tachycardia) (HCC)   . Restless leg syndrome   . Sleep apnea     Past Surgical History:  Procedure Laterality Date  . CERVICAL FUSION    . CHOLECYSTECTOMY N/A 01/31/2020   Procedure: LAPAROSCOPIC CHOLECYSTECTOMY;  Surgeon: Franky Macho, MD;  Location: [facility];  Service: General;  Laterality: N/A;  . CORONARY STENT INTERVENTION N/A 08/13/2018   Procedure: CORONARY STENT INTERVENTION;  Surgeon: Tonny Bollman, MD;  Location: [facility] INVASIVE CV LAB;  Service: Cardiovascular;  Laterality: N/A;  . LEFT HEART CATH AND CORONARY ANGIOGRAPHY N/A 08/13/2018   Procedure: LEFT HEART CATH AND CORONARY ANGIOGRAPHY;  Surgeon: Tonny Bollman, MD;  Location: [facility] INVASIVE CV LAB;  Service: Cardiovascular;  Laterality: N/A;  . nasal septum repair       Allergies  Allergen Reactions  . Statins     Muscle aches, liver pain; Tried Atorvastatin, Crestor,  Pravastatin, and Simvastatin   Inpatient Medications    . aspirin EC  81 mg Oral Daily  . cloNIDine  0.1 mg Oral BID  . ezetimibe  10 mg Oral Daily  . insulin aspart  0-15 Units Subcutaneous TID WC  . insulin aspart  0-5 Units Subcutaneous QHS  . insulin glargine  40 Units Subcutaneous QHS  . irbesartan  300 mg Oral Daily  . metoprolol succinate  125 mg Oral Daily  . pantoprazole (PROTONIX)  IV  40 mg Intravenous Q24H  . pregabalin  100 mg Oral QHS  . rOPINIRole  2 mg Oral QHS  . temazepam  15 mg Oral QHS    Family History    Family History  Problem Relation Age of Onset  . Cancer Mother        breast  . Hypertension Mother   . Diabetes Father   . Heart disease Father   . Hypertension Father   . Diabetes Brother   . Heart disease Brother   . Hypertension Brother   . Colon cancer Neg Hx    He indicated that his mother is alive. He indicated that his father is deceased. He indicated that the status of his brother is unknown. He indicated that the status of his neg hx is unknown.   Social History    Social History   Socioeconomic History  . Marital status: Married    Spouse name: Not on file  . Number of children: Not on file  . Years of education: Not on file  . Highest education level: Not on file  Occupational History  . Not on file  Tobacco Use  . Smoking status: Never Smoker  . Smokeless tobacco: Never Used  . Tobacco comment: since age 51 - marijuana   Vaping Use  . Vaping Use: Never used  Substance and Sexual Activity  . Alcohol use: Not Currently    Alcohol/week: 14.0 standard drinks    Types: 14 Glasses of wine per week    Comment: wine usually daily  . Drug use: Yes    Types: Marijuana    Comment: used since age 78   . Sexual activity: Not on file  Other Topics Concern  . Not on file  Social History Narrative  . Not on file   Social Determinants of Health   Financial Resource Strain: Not on file  Food Insecurity: No Food Insecurity  . Worried About Programme researcher, broadcasting/film/video in the Last Year: Never true  . Ran Out of Food in the Last Year: Never true  Transportation Needs: No Transportation Needs  . Lack of Transportation (Medical): No  . Lack of Transportation (Non-Medical): No  Physical Activity: Not on file  Stress: Not on file  Social Connections: Not on file  Intimate Partner Violence: Not on file     Review of Systems     General:  No chills, fever, night sweats or weight changes.  Cardiovascular:  No chest pain, dyspnea on exertion, edema, orthopnea, palpitations, paroxysmal nocturnal dyspnea. Dermatological: No rash, lesions/masses Respiratory: No cough, dyspnea Urologic: No hematuria, dysuria Abdominal:   No nausea, vomiting, diarrhea, bright red blood per rectum, melena, or hematemesis Neurologic:  No visual changes, wkns, changes in mental status. All other systems reviewed and are otherwise negative except as noted above.  Physical Exam    Blood pressure (!) 152/81, pulse 84, temperature 98.5 F (36.9 C), temperature source Oral, resp. rate 15, height 6\' 2"  (1.88 m), weight 116.2 kg,  SpO2 96 %.     Intake/Output Summary (Last 24 hours) at 10/09/2020 0541 Last data filed at 10/09/2020 0327 Gross per 24 hour  Intake --  Output 250 ml  Net -250 ml   Wt Readings from Last 3 Encounters:  10/09/20 116.2 kg  07/09/20 119.2 kg  06/18/20 119.7 kg    CONSTITUTIONAL: alert and conversant, well-appearing, nourished, no distress HEENT: oropharynx clear and moist, no mucosal lesions, normal dentition, conjunctiva normal, EOM intact, pupils equal, no lid lag. NECK: supple, no cervical adenopathy, no thyromegaly CARDIOVASCULAR: Regular rhythm. No gallop, murmur, or rub. Normal S1/S2. Radial pulses intact. JVP flat. No carotid bruits. PULMONARY/CHEST WALL: no deformities, normal breath sounds bilaterally, normal work of breathing ABDOMINAL: soft, non-tender, non-distended EXTREMITIES: no edema or muscle atrophy, warm and well-perfused SKIN: Dry and intact without apparent rashes or wounds. NEUROLOGIC: alert, normal gait, no abnormal movements, cranial nerves grossly intact.   Labs    Troponin [facility](Point of Care Test) Troponin 3, 6, 16 Lab Results  Component Value Date   WBC 11.4 (H) 10/08/2020   HGB 17.3 (H) 10/08/2020   HCT 49.7 10/08/2020   MCV 94.5 10/08/2020   PLT 171 10/08/2020    Recent Labs   Lab 10/08/20 1342 10/08/20 2154  NA 138  --   K 3.4*  --   CL 98  --   CO2 28  --   BUN 22  --   CREATININE 0.93  --   CALCIUM 9.4  --   PROT  --  7.3  BILITOT  --  1.1  ALKPHOS  --  84  ALT  --  49*  AST  --  26  GLUCOSE 222*  --    Lab Results  Component Value Date   CHOL 143 08/14/2018   HDL 26 (L) 08/14/2018   LDLCALC 99 08/14/2018   TRIG 91 08/14/2018   No results found for: [facility]   Radiology Studies    DG Chest 2 View  Result Date: 10/08/2020 CLINICAL DATA:  Chest pain negative COVID test EXAM: CHEST - 2 VIEW COMPARISON:  12/05/2019 FINDINGS: Heart size and vascularity normal. Negative for heart failure. Mild elevation right hemidiaphragm mild right lower lobe atelectasis, unchanged. No acute infiltrate or effusion. IMPRESSION: Mild right lower lobe atelectasis.  No acute abnormality. Electronically Signed   By: Marlan Palauharles  Clark M.D.   On: 10/08/2020 13:59   CT Angio Chest PE W and/or Wo Contrast  Result Date: 10/08/2020 CLINICAL DATA:  Lower chest pain EXAM: CT ANGIOGRAPHY CHEST WITH CONTRAST TECHNIQUE: Multidetector CT imaging of the chest was performed using the standard protocol during bolus administration of intravenous contrast. Multiplanar CT image reconstructions and MIPs were obtained to evaluate the vascular anatomy. CONTRAST:  100mL OMNIPAQUE IOHEXOL 350 MG/ML SOLN COMPARISON:  None. FINDINGS: Cardiovascular: Examination for pulmonary embolism is somewhat limited by breath motion artifact throughout. Within this limitation, no evidence of pulmonary embolism through the proximal segmental pulmonary arterial level. Cardiomegaly. Left and right coronary artery calcifications. No pericardial effusion. Aortic atherosclerosis. Mediastinum/Nodes: No enlarged mediastinal, hilar, or axillary lymph nodes. Thyroid gland, trachea, and esophagus demonstrate no significant findings. Lungs/Pleura: Bandlike scarring of the right lung base. No pleural effusion or pneumothorax. Upper  Abdomen: Please see separately reported CT examination of the abdomen and pelvis. Musculoskeletal: No chest wall abnormality. No acute or significant osseous findings. Review of the MIP images confirms the above findings. IMPRESSION: 1. Examination for pulmonary embolism is somewhat limited by breath motion artifact throughout. Within  this limitation, no evidence of pulmonary embolism through the proximal segmental pulmonary arterial level. 2. Bandlike scarring of the right lung base. No acute appearing airspace opacity. 3. Cardiomegaly and coronary artery disease. Aortic Atherosclerosis (ICD10-I70.0). Electronically Signed   By: Lauralyn Primes M.D.   On: 10/08/2020 18:03   CT ABDOMEN PELVIS W CONTRAST  Result Date: 10/08/2020 CLINICAL DATA:  Severe epigastric pain EXAM: CT ABDOMEN AND PELVIS WITH CONTRAST TECHNIQUE: Multidetector CT imaging of the abdomen and pelvis was performed using the standard protocol following bolus administration of intravenous contrast. CONTRAST:  OMNIPAQUE IOHEXOL 350 MG/ML SOLN COMPARISON:  MR abdomen, 05/14/2020, CT abdomen pelvis, 12/05/2019 FINDINGS: Lower chest: No acute abnormality. Hepatobiliary: No focal liver abnormality is seen. Hepatic steatosis. Status post cholecystectomy. No biliary dilatation. Pancreas: Unremarkable. No pancreatic ductal dilatation or surrounding inflammatory changes. Spleen: Normal in size without significant abnormality. Adrenals/Urinary Tract: Adrenal glands are unremarkable. Multiple low-attenuation lesions of the kidneys bilaterally, previously characterized as cysts and better evaluated by MRI. Kidneys are otherwise normal, without renal calculi, solid lesion, or hydronephrosis. Bladder is unremarkable. Stomach/Bowel: Stomach is within normal limits. Appendix appears normal. No evidence of bowel wall thickening, distention, or inflammatory changes. Sigmoid diverticula. Vascular/Lymphatic: Aortic atherosclerosis. No enlarged abdominal or pelvic  lymph nodes. Reproductive: No mass or other significant abnormality. Other: Left greater than right fat containing bilateral inguinal hernias. No abdominopelvic ascites. Musculoskeletal: No acute or significant osseous findings. IMPRESSION: 1. No acute CT findings of the abdomen or pelvis to explain epigastric pain. 2. Hepatic steatosis. 3. Status post cholecystectomy. 4. Sigmoid diverticulosis without evidence of acute diverticulitis. Aortic Atherosclerosis (ICD10-I70.0). Electronically Signed   By: Lauralyn Primes M.D.   On: 10/08/2020 18:08    ECG & Cardiac Imaging    TTE 06/20/20 1. Left ventricular ejection fraction, by estimation, is 60 to 65%. The  left ventricle has normal function. The left ventricle has no regional  wall motion abnormalities. There is moderate left ventricular hypertrophy.  Left ventricular diastolic  parameters are indeterminate. Elevated left atrial pressure.  2. Right ventricular systolic function is normal. The right ventricular  size is normal.  3. The mitral valve is normal in structure. Trivial mitral valve  regurgitation. No evidence of mitral stenosis.  4. The aortic valve is tricuspid. There is mild calcification of the  aortic valve. There is mild thickening of the aortic valve. Aortic valve  regurgitation is not visualized. No aortic stenosis is present.  5. Aortic dilatation noted. There is mild dilatation of the ascending  aorta, measuring 40 mm.  6. The inferior vena cava is normal in size with greater than 50%  respiratory variability, suggesting right atrial pressure of 3 mmHg.   Assessment & Plan    71 year old male with obesity, hypertension, hyperlipidemia, diabetes, statin intolerance, known CAD who presents with chest pain.  He describes typical sounding chest pain that is similar to prior presentations.  ECG is unchanged and troponin is borderline positive at 15 yesterday evening from 3 at presentation.  Overall sounds concerning for  NSTEMI/ACS.  Discussed with patient and he would prefer an invasive approach.  Please keep NPO for left heart cathterization and manage medically until that time.  Agree with holding off on nitroglycerin until tadalafil has washed out.  #NSTEMI/ACS #CAD - ASA 81 mg daily s/p 325 mg ASA - Heparin infusion - Continue home BB, ARB - Statin intolerant and high intensity statin deferred; continue ezetimibe; would benefit from PCSK9 long term - NPO for  LHC today - TTE; update echocardiogram  #HTN - Continue metoprolol, clonidine, olmesartan, spironolactone  #T2DM - Recommend SGLT2i  #Chronic diastolic heart failure - Continue home furosemide; appears euvolemic on my evaluation.  Signed, Regino Schultze, MD 10/09/2020, 5:41 AM  For questions or updates, please contact   Please consult [email or web address] for contact info under Cardiology/STEMI.

## 2020-10-09 NOTE — Interval H&P Note (Signed)
History and Physical Interval Note:  10/09/2020 11:51 AM  Russell Morrow  has presented today for surgery, with the diagnosis of unstable angina.  The various methods of treatment have been discussed with the patient and family. After consideration of risks, benefits and other options for treatment, the patient has consented to  Procedure(s): LEFT HEART CATH AND CORONARY ANGIOGRAPHY (N/A) as a surgical intervention.  The patient's history has been reviewed, patient examined, no change in status, stable for surgery.  I have reviewed the patient's chart and labs.  Questions were answered to the patient's satisfaction.   Cath Lab Visit (complete for each Cath Lab visit)  Clinical Evaluation Leading to the Procedure:   ACS: Yes.    Non-ACS:    Anginal Classification: CCS IV  Anti-ischemic medical therapy: Minimal Therapy (1 class of medications)  Non-Invasive Test Results: No non-invasive testing performed  Prior CABG: No previous CABG        Theron Arista [facility] 10/09/2020 11:52 AM

## 2020-10-09 NOTE — Anesthesia Preprocedure Evaluation (Addendum)
Anesthesia Evaluation  Patient identified by MRN, date of birth, ID band Patient awake    Reviewed: Allergy & Precautions, NPO status , Patient's Chart, lab work & pertinent test results  Airway Mallampati: II  TM Distance: >3 FB Neck ROM: Full    Dental  (+) Upper Dentures, Lower Dentures   Pulmonary sleep apnea , Patient abstained from smoking.,    Pulmonary exam normal        Cardiovascular hypertension, Pt. on medications and Pt. on home beta blockers + angina + CAD and + Past MI  + dysrhythmias Supra Ventricular Tachycardia  Rhythm:Irregular Rate:Normal     Neuro/Psych negative neurological ROS  negative psych ROS   GI/Hepatic Neg liver ROS, GERD  Medicated,  Endo/Other  diabetes, Type 2, Insulin Dependent  Renal/GU negative Renal ROS  negative genitourinary   Musculoskeletal negative musculoskeletal ROS (+)   Abdominal (+)  Abdomen: soft. Bowel sounds: normal.  Peds  Hematology  (+) anemia ,   Anesthesia Other Findings   Reproductive/Obstetrics                           Anesthesia Physical Anesthesia Plan  ASA: IV  Anesthesia Plan: General   Post-op Pain Management:    Induction: Intravenous  PONV Risk Score and Plan: 2 and Midazolam and Treatment may vary due to age or medical condition  Airway Management Planned: Mask and Oral ETT  Additional Equipment: CVP, Arterial line, PA Cath, 3D TEE and TEE  Intra-op Plan:   Post-operative Plan: Post-operative intubation/ventilation  Informed Consent: I have reviewed the patients History and Physical, chart, labs and discussed the procedure including the risks, benefits and alternatives for the proposed anesthesia with the patient or authorized representative who has indicated his/her understanding and acceptance.     Dental advisory given  Plan Discussed with: CRNA  Anesthesia Plan Comments: (Lab Results      Component                 Value               Date                      WBC                      12.7 (H)            10/09/2020                HGB                      16.3                10/09/2020                HCT                      46.1                10/09/2020                MCV                      93.1                10/09/2020  PLT                      185                 10/09/2020           Lab Results      Component                Value               Date                      NA                       139                 10/09/2020                K                        3.8                 10/09/2020                CO2                      32                  10/09/2020                GLUCOSE                  136 (H)             10/09/2020                BUN                      17                  10/09/2020                CREATININE               1.10                10/09/2020                CALCIUM                  9.1                 10/09/2020                GFRNONAA                 >60                 10/09/2020                GFRAA                    >60                 01/30/2020           Cath 10/09/20:  Ost LAD to Prox LAD lesion is 40% stenosed.  Previously placed Prox Cx to Mid Cx drug  eluting stent is widely patent.  RPAV lesion is 30% stenosed.  Prox RCA lesion is 40% stenosed.  Prox LAD to Mid LAD lesion is 100% stenosed.  Prox Cx lesion is 90% stenosed.  The left ventricular systolic function is normal.  LV end diastolic pressure is normal.  The left ventricular ejection fraction is 55-65% by visual estimate. 1. Severe 2 vessel obstructive/occlusive CAD. There is occlusion of the mid LAD immediately after the first diagonal. The LAD fills well by right to left collaterals. There is severe segmental disease in the proximal LCx prior to the previous stent 2. Normal LV function 3. Normal LVEDP  ECHO 01/22: . Left ventricular ejection fraction, by  estimation, is 60 to 65%. The  left ventricle has normal function. The left ventricle has no regional  wall motion abnormalities. There is moderate left ventricular hypertrophy.  Left ventricular diastolic  parameters are indeterminate. Elevated left atrial pressure.  2. Right ventricular systolic function is normal. The right ventricular  size is normal.  3. The mitral valve is normal in structure. Trivial mitral valve  regurgitation. No evidence of mitral stenosis.  4. The aortic valve is tricuspid. There is mild calcification of the  aortic valve. There is mild thickening of the aortic valve. Aortic valve  regurgitation is not visualized. No aortic stenosis is present.  5. Aortic dilatation noted. There is mild dilatation of the ascending  aorta, measuring 40 mm.  6. The inferior vena cava is normal in size with greater than 50%  respiratory variability, suggesting right atrial pressure of 3 mmHg.  )       Anesthesia Quick Evaluation

## 2020-10-09 NOTE — Progress Notes (Deleted)
Clinical Summary Russell Morrow is a 70 y.o.male   Past Medical History:  Diagnosis Date  . CAD (coronary artery disease)    a. mild nonobstructive CAD by cath in 2010 b. NST in 11/2015 showing a fixed defect with no ischemia and Coronary CT showing nonobstructive CAD along LAD and LCx c. 08/2018: s/p NSTEMI with cath showing acute total occlusion of LCx (treated with DES) and nonobstructive CAD along RCA and LAD.   . Diabetes mellitus without complication (HCC)   . Diverticulitis   . Gallstones   . Hypertension   . IDA (iron deficiency anemia)   . MI (myocardial infarction) (HCC)   . PSVT (paroxysmal supraventricular tachycardia) (HCC)   . Restless leg syndrome   . Sleep apnea      Allergies  Allergen Reactions  . Statins     Muscle aches, liver pain; Tried Atorvastatin, Crestor, Pravastatin, and Simvastatin     No current facility-administered medications for this visit.   No current outpatient medications on file.   Facility-Administered Medications Ordered in Other Visits  Medication Dose Route Frequency Provider Last Rate Last Admin  . 0.9 %  sodium chloride infusion  250 mL Intravenous PRN Dunn, Dayna N, PA-C      . 0.9% sodium chloride infusion  1 mL/kg/hr Intravenous Continuous Dunn, Dayna N, PA-C      . acetaminophen (TYLENOL) tablet 650 mg  650 mg Oral Q4H PRN Zierle-Ghosh, Asia B, DO      . [START ON 10/10/2020] aspirin EC tablet 81 mg  81 mg Oral Daily Dahal, Binaya, MD      . cloNIDine (CATAPRES) tablet 0.1 mg  0.1 mg Oral BID Zierle-Ghosh, Asia B, DO      . ezetimibe (ZETIA) tablet 10 mg  10 mg Oral Daily Zierle-Ghosh, Asia B, DO   10 mg at 10/09/20 1041  . heparin ADULT infusion 100 units/mL (25000 units/226mL)  1,800 Units/hr Intravenous Continuous Juliette Mangle, RPH 18 mL/hr at 10/09/20 1037 1,800 Units/hr at 10/09/20 1037  . insulin aspart (novoLOG) injection 0-15 Units  0-15 Units Subcutaneous TID WC Zierle-Ghosh, Asia B, DO   3 Units at 10/09/20 0853  .  insulin aspart (novoLOG) injection 0-5 Units  0-5 Units Subcutaneous QHS Zierle-Ghosh, Asia B, DO      . insulin glargine (LANTUS) injection 40 Units  40 Units Subcutaneous QHS Zierle-Ghosh, Asia B, DO      . irbesartan (AVAPRO) tablet 300 mg  300 mg Oral Daily Zierle-Ghosh, Asia B, DO   300 mg at 10/09/20 1041  . metoprolol succinate (TOPROL-XL) 24 hr tablet 125 mg  125 mg Oral Daily Zierle-Ghosh, Asia B, DO   125 mg at 10/09/20 1040  . ondansetron (ZOFRAN) injection 4 mg  4 mg Intravenous Q6H PRN Zierle-Ghosh, Asia B, DO      . pantoprazole (PROTONIX) injection 40 mg  40 mg Intravenous Q24H Zierle-Ghosh, Asia B, DO      . pregabalin (LYRICA) capsule 100 mg  100 mg Oral QHS Zierle-Ghosh, Asia B, DO      . rOPINIRole (REQUIP) tablet 2 mg  2 mg Oral QHS Zierle-Ghosh, Asia B, DO      . sodium chloride flush (NS) 0.9 % injection 3 mL  3 mL Intravenous Q12H Dunn, Dayna N, PA-C      . sodium chloride flush (NS) 0.9 % injection 3 mL  3 mL Intravenous PRN Dunn, Dayna N, PA-C      . temazepam (RESTORIL) capsule  15 mg  15 mg Oral QHS Zierle-Ghosh, Asia B, DO      . temazepam (RESTORIL) capsule 15 mg  15 mg Oral QHS PRN Shalhoub, Deno Lunger, MD   15 mg at 10/09/20 8675     Past Surgical History:  Procedure Laterality Date  . CERVICAL FUSION    . CHOLECYSTECTOMY N/A 01/31/2020   Procedure: LAPAROSCOPIC CHOLECYSTECTOMY;  Surgeon: Franky Macho, MD;  Location: [facility];  Service: General;  Laterality: N/A;  . CORONARY STENT INTERVENTION N/A 08/13/2018   Procedure: CORONARY STENT INTERVENTION;  Surgeon: Tonny Bollman, MD;  Location: [facility] INVASIVE CV LAB;  Service: Cardiovascular;  Laterality: N/A;  . LEFT HEART CATH AND CORONARY ANGIOGRAPHY N/A 08/13/2018   Procedure: LEFT HEART CATH AND CORONARY ANGIOGRAPHY;  Surgeon: Tonny Bollman, MD;  Location: [facility] INVASIVE CV LAB;  Service: Cardiovascular;  Laterality: N/A;  . nasal septum repair       Allergies  Allergen Reactions  . Statins     Muscle aches, liver  pain; Tried Atorvastatin, Crestor, Pravastatin, and Simvastatin      Family History  Problem Relation Age of Onset  . Cancer Mother        breast  . Hypertension Mother   . Diabetes Father   . Heart disease Father   . Hypertension Father   . Diabetes Brother   . Heart disease Brother   . Hypertension Brother   . Colon cancer Neg Hx      Social History Mr. Venard reports that he has never smoked. He has never used smokeless tobacco. Mr. Lorimer reports previous alcohol use of about 14.0 standard drinks of alcohol per week.   Review of Systems CONSTITUTIONAL: No weight loss, fever, chills, weakness or fatigue.  HEENT: Eyes: No visual loss, blurred vision, double vision or yellow sclerae.No hearing loss, sneezing, congestion, runny nose or sore throat.  SKIN: No rash or itching.  CARDIOVASCULAR:  RESPIRATORY: No shortness of breath, cough or sputum.  GASTROINTESTINAL: No anorexia, nausea, vomiting or diarrhea. No abdominal pain or blood.  GENITOURINARY: No burning on urination, no polyuria NEUROLOGICAL: No headache, dizziness, syncope, paralysis, ataxia, numbness or tingling in the extremities. No change in bowel or bladder control.  MUSCULOSKELETAL: No muscle, back pain, joint pain or stiffness.  LYMPHATICS: No enlarged nodes. No history of splenectomy.  PSYCHIATRIC: No history of depression or anxiety.  ENDOCRINOLOGIC: No reports of sweating, cold or heat intolerance. No polyuria or polydipsia.  Marland Kitchen   Physical Examination There were no vitals filed for this visit. There were no vitals filed for this visit.  Gen: resting comfortably, no acute distress HEENT: no scleral icterus, pupils equal round and reactive, no palptable cervical adenopathy,  CV Resp: Clear to auscultation bilaterally GI: abdomen is soft, non-tender, non-distended, normal bowel sounds, no hepatosplenomegaly MSK: extremities are warm, no edema.  Skin: warm, no rash Neuro:  no focal deficits Psych:  appropriate affect   Diagnostic Studies     Assessment and Plan        Antoine Poche, M.D., F.A.C.C.

## 2020-10-09 NOTE — Progress Notes (Signed)
Pt requesting something for sleep. Also has new order for IV K replacement. Pt is requesting PO. Provider on call paged via amion. Will continue to monitor. Dierdre Highman, RN

## 2020-10-09 NOTE — Plan of Care (Signed)
  Problem: Health Behavior/Discharge Planning: Goal: Ability to manage health-related needs will improve Outcome: Progressing   Problem: Clinical Measurements: Goal: Ability to maintain clinical measurements within normal limits will improve Outcome: Progressing   Problem: Nutrition: Goal: Adequate nutrition will be maintained Outcome: Completed/Met   Problem: Elimination: Goal: Will not experience complications related to bowel motility Outcome: Completed/Met Goal: Will not experience complications related to urinary retention Outcome: Completed/Met

## 2020-10-09 NOTE — Progress Notes (Signed)
Pre-CABG Dopplers completed. Refer to "CV Proc" under chart review to view preliminary results.  10/09/2020 3:01 PM Eula Fried., MHA, RVT, RDCS, RDMS

## 2020-10-09 NOTE — H&P (Signed)
TRH H&P    Patient Demographics:    Russell Morrow, is a 70 y.o. male  MRN: [id number]  DOB - [birth year]  Admit Date - 10/08/2020  Referring MD/NP/PA: Long  Outpatient Primary MD for the patient is Benita Stabile, MD  Patient coming from: Home  Chief complaint- chest pain   HPI:    Russell Morrow  is a 70 y.o. male, with history of paroxysmal supraventricular tachycardia, MI, hypertension, diabetes mellitus type 2, sleep apnea, and more presents the ED with a chief complaint of chest pain.  He reports that the pain is exactly how it felt when he had an MI in March 2020.  Patient describes it as "subxiphoid pain," that feels like a knot, is constant, and sometimes sharp.  It is worse now than it was at presentation.  He reports that Maalox made it worse and is currently a 7 out of 10 pain.  He denies radiation of the pain.  He does not have any nausea now but reports severe vomiting earlier.  He reports he threw up 3-4 times.  The emesis appeared as yellow fluid.  Patient reports his last meal was approximately an hour prior to the onset of symptoms and was a cheese and steak [employer] biscuit.  Patient reports that he has associated dyspnea at rest.  All movements make the dyspnea go away.  He reports that the dyspnea has been going on for months.  The chest pain itself started at 10 AM on 10/08/2020.  He does not have any palpitations or dizziness.  He has no other complaints at this time.  Patient reports that he worked in a [employer], and he knows that this feels like a heart attack.  Patient does not smoke cigarettes, drinks 2 alcoholic drinks per night-has never had withdrawal symptoms, smokes marijuana 5 times per day, and is vaccinated for COVID.  Patient is full code.  In the ED Temperature 97.8, heart rate 59-97, respiratory rate 13-28, blood pressure 150/80 at admission, satting between 88 and 98% on room air Slight  leukocytosis with a white blood cell count of 11.4, hemoglobin 17.3 Slight hypokalemia with a potassium of 3.4 Hyperglycemia at 222 CTA chest is limited by breath motion artifact, but has no evidence of acute PE.   CT abdomen pelvis shows no acute findings. Chest x-ray shows right lower lobe atelectasis EKG is without ischemic changes Patient was given 324 of aspirin in the ED, but could not have nitro due to having taken Cialis late last night A total of 8 mg of morphine were given Cardiology was consulted and requested admission for unstable angina Patient was started on heparin drip    Review of systems:    In addition to the HPI above,  No Fever-chills, No Headache, No changes with Vision or hearing, No problems swallowing food or Liquids, Admits to chest pain and shortness of breath but no cough Admits to epigastric pain, nausea, vomiting, bowel movements regular, No Blood in stool or Urine, No dysuria, No new skin rashes or bruises,  No new joints pains-aches,  No new weakness, tingling, numbness in any extremity, No recent weight gain or loss, No polyuria, polydypsia or polyphagia, No significant Mental Stressors.  All other systems reviewed and are negative.    Past History of the following :    Past Medical History:  Diagnosis Date  . CAD (coronary artery disease)    a. mild nonobstructive CAD by cath in 2010 b. NST in 11/2015 showing a fixed defect with no ischemia and Coronary CT showing nonobstructive CAD along LAD and LCx c. 08/2018: s/p NSTEMI with cath showing acute total occlusion of LCx (treated with DES) and nonobstructive CAD along RCA and LAD.   . Diabetes mellitus without complication (HCC)   . Diverticulitis   . Gallstones   . Hypertension   . IDA (iron deficiency anemia)   . MI (myocardial infarction) (HCC)   . PSVT (paroxysmal supraventricular tachycardia) (HCC)   . Restless leg syndrome   . Sleep apnea       Past Surgical History:  Procedure  Laterality Date  . CERVICAL FUSION    . CHOLECYSTECTOMY N/A 01/31/2020   Procedure: LAPAROSCOPIC CHOLECYSTECTOMY;  Surgeon: Franky Macho, MD;  Location: [facility];  Service: General;  Laterality: N/A;  . CORONARY STENT INTERVENTION N/A 08/13/2018   Procedure: CORONARY STENT INTERVENTION;  Surgeon: Tonny Bollman, MD;  Location: [facility] INVASIVE CV LAB;  Service: Cardiovascular;  Laterality: N/A;  . LEFT HEART CATH AND CORONARY ANGIOGRAPHY N/A 08/13/2018   Procedure: LEFT HEART CATH AND CORONARY ANGIOGRAPHY;  Surgeon: Tonny Bollman, MD;  Location: [facility] INVASIVE CV LAB;  Service: Cardiovascular;  Laterality: N/A;  . nasal septum repair        Social History:      Social History   Tobacco Use  . Smoking status: Never Smoker  . Smokeless tobacco: Never Used  . Tobacco comment: since age 53 - marijuana   Substance Use Topics  . Alcohol use: Not Currently    Alcohol/week: 14.0 standard drinks    Types: 14 Glasses of wine per week    Comment: wine usually daily       Family History :     Family History  Problem Relation Age of Onset  . Cancer Mother        breast  . Hypertension Mother   . Diabetes Father   . Heart disease Father   . Hypertension Father   . Diabetes Brother   . Heart disease Brother   . Hypertension Brother   . Colon cancer Neg Hx       Home Medications:   Prior to Admission medications   Medication Sig Start Date End Date Taking? Authorizing Provider  acetaminophen (TYLENOL) 500 MG tablet Take 1,000 mg by mouth every 6 (six) hours as needed for moderate pain.     [provider]  amLODipine (NORVASC) 10 MG tablet Take 1 tablet (10 mg total) by mouth daily. 02/06/20 05/06/20  Antoine Poche, MD  aspirin EC 81 MG EC tablet Take 1 tablet (81 mg total) by mouth daily. 08/15/18   Robbie Lis M, PA-C  BD PEN NEEDLE NANO U/F 32G X 4 MM MISC Inject into the skin. 09/12/20   [provider]  cloNIDine (CATAPRES) 0.1 MG tablet Week 1: 1 tablet  twice daily, Week 2: 2 tablets twice daily, Week 3: 3 tablets twice daily 07/18/20   Barrett, Joline Salt, PA-C  dicyclomine (BENTYL) 10 MG capsule Take 10 mg by mouth 4 (four) times daily  as needed for spasms.  11/18/19   [provider]  esomeprazole (NEXIUM) 20 MG capsule Take 20 mg by mouth 2 (two) times daily before a meal.    [provider]  ezetimibe (ZETIA) 10 MG tablet Take 10 mg by mouth daily. 03/20/20   [provider]  FEROSUL 325 (65 Fe) MG tablet Take 325 mg by mouth 2 (two) times daily.  11/18/19   [provider]  furosemide (LASIX) 40 MG tablet Take 1 tablet (40 mg total) by mouth 2 (two) times daily. 06/18/20 09/16/20  Antoine Poche, MD  HUMALOG KWIKPEN 100 UNIT/ML KwikPen Inject into the skin 3 (three) times daily. 09/12/20   [provider]  ibuprofen (ADVIL) 200 MG tablet Take 200 mg by mouth every 6 (six) hours as needed for moderate pain.     [provider]  insulin lispro (HUMALOG) 100 UNIT/ML injection Inject 8-16 Units into the skin 3 (three) times daily with meals.    [provider]  LANTUS SOLOSTAR 100 UNIT/ML Solostar Pen Inject 35 Units into the skin 2 (two) times daily. Patient taking differently: Inject 45 Units into the skin 2 (two) times daily. 12/07/19   Zigmund Daniel., MD  metoprolol succinate (TOPROL-XL) 100 MG 24 hr tablet Take 100 mg by mouth daily. 09/12/20   [provider]  metoprolol succinate (TOPROL-XL) 25 MG 24 hr tablet Take by mouth. 07/16/20   [provider]  Metoprolol Succinate 100 MG CS24 Take 100 mg by mouth daily. 07/18/20   Antoine Poche, MD  olmesartan (BENICAR) 40 MG tablet Take 1 tablet (40 mg total) by mouth daily. 02/06/20   Antoine Poche, MD  potassium chloride SA (KLOR-CON) 20 MEQ tablet Take 2 tablets (40 mEq total) by mouth daily. 07/10/20   Antoine Poche, MD  pregabalin (LYRICA) 100 MG capsule Take 100 mg by mouth at bedtime. 09/19/20   [provider]  pregabalin (LYRICA) 75 MG capsule Take 1 capsule (75 mg total) by mouth at bedtime. 12/07/19 02/06/20  Zigmund Daniel., MD  rOPINIRole (REQUIP) 1 MG tablet Take 2 mg by mouth at bedtime. 07/07/18   [provider]  spironolactone (ALDACTONE) 25 MG tablet Take 1 tablet (25 mg total) by mouth daily. 02/06/20 05/06/20  Antoine Poche, MD  tadalafil (CIALIS) 10 MG tablet Take 10 mg by mouth daily as needed for erectile dysfunction.  11/18/19   [provider]  temazepam (RESTORIL) 15 MG capsule Take 15 mg by mouth at bedtime. 07/07/18   [provider]  TRULICITY 0.75 MG/0.5ML SOPN Inject 0.75 mg into the skin once a week. 09/12/20   [provider]  TRULICITY 1.5 MG/0.5ML SOPN Inject 0.75 mg into the skin once a week. 09/19/20   [provider]     Allergies:     Allergies  Allergen Reactions  . Statins     Muscle aches, liver pain; Tried Atorvastatin, Crestor, Pravastatin, and Simvastatin     Physical Exam:   Vitals  Blood pressure (!) 150/80, pulse 77, temperature 97.8 F (36.6 C), temperature source Oral, resp. rate (!) 21, weight 117.9 kg, SpO2 98 %.  1.  General: Patient lying supine in bed with head of bed elevated, no acute distress  2. Psychiatric: Patient is alert, oriented x3, behavior is normal for situation, cooperative with exam  3. Neurologic: Face is symmetric, speech and language are normal, moves all 4 extremities voluntarily, alert and oriented x3,  no acute deficits on limited exam  4. HEENMT:  Head is atraumatic, normocephalic, pupils are reactive to light, neck is supple, trachea is midline, mucous membranes are moist  5. Respiratory : Lungs are clear to auscultation bilaterally without wheezes, rhonchi, rales, no increased work of breathing, no cyanosis  6. Cardiovascular : Heart rate is normal, rhythm is regular, no murmurs rubs or gallops, trace peripheral edema, peripheral pulses  palpated  7. Gastrointestinal:  Abdomen is soft, nondistended, nontender to palpation, no masses palpated, bowel sounds active  8. Skin:  Skin is warm dry and intact without acute lesion on limited exam, early venous stasis changes in lower extremities  9.Musculoskeletal:  No calf tenderness, no acute deformity, peripheral pulses palpated    Data Review:    CBC Recent Labs  Lab 10/08/20 1342  WBC 11.4*  HGB 17.3*  HCT 49.7  PLT 171  MCV 94.5  MCH 32.9  MCHC 34.8  RDW 13.6   ------------------------------------------------------------------------------------------------------------------  Results for orders placed or performed during the hospital encounter of 10/08/20 (from the past 48 hour(s))  Basic metabolic panel     Status: Abnormal   Collection Time: 10/08/20  1:42 PM  Result Value Ref Range   Sodium 138 135 - 145 mmol/L   Potassium 3.4 (L) 3.5 - 5.1 mmol/L   Chloride 98 98 - 111 mmol/L   CO2 28 22 - 32 mmol/L   Glucose, Bld 222 (H) 70 - 99 mg/dL    Comment: Glucose reference range applies only to samples taken after fasting for at least 8 hours.   BUN 22 8 - 23 mg/dL   Creatinine, Ser 2.95 0.61 - 1.24 mg/dL   Calcium 9.4 8.9 - 62.1 mg/dL   GFR, Estimated >30 >86 mL/min    Comment: (NOTE) Calculated using the CKD-EPI Creatinine Equation (2021)    Anion gap 12 5 - 15    Comment: Performed at [facility], [address], [place], [place] [address]  CBC     Status: Abnormal   Collection Time: 10/08/20  1:42 PM  Result Value Ref Range   WBC 11.4 (H) 4.0 - 10.5 K/uL   RBC 5.26 4.22 - 5.81 MIL/uL   Hemoglobin 17.3 (H) 13.0 - 17.0 g/dL   HCT 96.2 95.2 - 84.1 %   MCV 94.5 80.0 - 100.0 fL   MCH 32.9 26.0 - 34.0 pg   MCHC 34.8 30.0 - 36.0 g/dL   RDW 32.4 40.1 - 02.7 %   Platelets 171 150 - 400 K/uL   nRBC 0.0 0.0 - 0.2 %    Comment: Performed at [facility], [address], [place], [place] [address]  Troponin I (High Sensitivity)     Status: None    Collection Time: 10/08/20  1:42 PM  Result Value Ref Range   Troponin I (High Sensitivity) 3 <18 ng/L    Comment: (NOTE) Elevated high sensitivity troponin I (hsTnI) values and significant  changes across serial measurements may suggest ACS but many other  chronic and acute conditions are known to elevate hsTnI results.  Refer to the "Links" section for chest pain algorithms and additional  guidance. Performed at [facility], [address] [place], [place] [address]   CBG monitoring, ED     Status: Abnormal   Collection Time: 10/08/20  2:40 PM  Result Value Ref Range   Glucose-Capillary 230 (H) 70 - 99 mg/dL    Comment: Glucose reference range applies only to samples taken after fasting for at  least 8 hours.  Troponin I (High Sensitivity)     Status: None   Collection Time: 10/08/20  3:23 PM  Result Value Ref Range   Troponin I (High Sensitivity) 3 <18 ng/L    Comment: (NOTE) Elevated high sensitivity troponin I (hsTnI) values and significant  changes across serial measurements may suggest ACS but many other  chronic and acute conditions are known to elevate hsTnI results.  Refer to the "Links" section for chest pain algorithms and additional  guidance. Performed at [facility], [address], [place], [place] [address]   Troponin I (High Sensitivity)     Status: None   Collection Time: 10/08/20  7:54 PM  Result Value Ref Range   Troponin I (High Sensitivity) 6 <18 ng/L    Comment: (NOTE) Elevated high sensitivity troponin I (hsTnI) values and significant  changes across serial measurements may suggest ACS but many other  chronic and acute conditions are known to elevate hsTnI results.  Refer to the "Links" section for chest pain algorithms and additional  guidance. Performed at [facility], [address], [place], [place] [address]   Hepatic function panel     Status: Abnormal   Collection Time: 10/08/20  9:54 PM  Result Value Ref Range   Total Protein 7.3 6.5 - 8.1  g/dL   Albumin 4.1 3.5 - 5.0 g/dL   AST 26 15 - 41 U/L   ALT 49 (H) 0 - 44 U/L   Alkaline Phosphatase 84 38 - 126 U/L   Total Bilirubin 1.1 0.3 - 1.2 mg/dL   Bilirubin, Direct 0.2 0.0 - 0.2 mg/dL   Indirect Bilirubin 0.9 0.3 - 0.9 mg/dL    Comment: Performed at [facility], [address] [place], [place] [address]  Lipase, blood     Status: None   Collection Time: 10/08/20  9:54 PM  Result Value Ref Range   Lipase 27 11 - 51 U/L    Comment: Performed at [facility], [address], [place], [place] [address]  Troponin I (High Sensitivity)     Status: None   Collection Time: 10/08/20  9:54 PM  Result Value Ref Range   Troponin I (High Sensitivity) 15 <18 ng/L    Comment: (NOTE) Elevated high sensitivity troponin I (hsTnI) values and significant  changes across serial measurements may suggest ACS but many other  chronic and acute conditions are known to elevate hsTnI results.  Refer to the "Links" section for chest pain algorithms and additional  guidance. Performed at [facility], [address] [place], [place] [address]     Chemistries  Recent Labs  Lab 10/08/20 1342 10/08/20 2154  NA 138  --   K 3.4*  --   CL 98  --   CO2 28  --   GLUCOSE 222*  --   BUN 22  --   CREATININE 0.93  --   CALCIUM 9.4  --   AST  --  26  ALT  --  49*  ALKPHOS  --  84  BILITOT  --  1.1   ------------------------------------------------------------------------------------------------------------------  ------------------------------------------------------------------------------------------------------------------ GFR: Estimated Creatinine Clearance: 98 mL/min (by C-G formula based on SCr of 0.93 mg/dL). Liver Function Tests: Recent Labs  Lab 10/08/20 2154  AST 26  ALT 49*  ALKPHOS 84  BILITOT 1.1  PROT 7.3  ALBUMIN 4.1   Recent Labs  Lab 10/08/20 2154  LIPASE 27   No results for input(s): AMMONIA in the last 168 hours. Coagulation Profile: No  results for input(s):  INR, PROTIME in the last 168 hours. Cardiac Enzymes: No results for input(s): CKTOTAL, CKMB, CKMBINDEX, TROPONINI in the last 168 hours. BNP (last 3 results) No results for input(s): PROBNP in the last 8760 hours. HbA1C: No results for input(s): HGBA1C in the last 72 hours. CBG: Recent Labs  Lab 10/08/20 1440  GLUCAP 230*   Lipid Profile: No results for input(s): CHOL, HDL, LDLCALC, TRIG, CHOLHDL, LDLDIRECT in the last 72 hours. Thyroid Function Tests: No results for input(s): TSH, T4TOTAL, FREET4, T3FREE, THYROIDAB in the last 72 hours. Anemia Panel: No results for input(s): VITAMINB12, FOLATE, FERRITIN, TIBC, IRON, RETICCTPCT in the last 72 hours.  --------------------------------------------------------------------------------------------------------------- Urine analysis:    Component Value Date/Time   COLORURINE YELLOW 12/05/2019 2344   APPEARANCEUR CLEAR 12/05/2019 2344   LABSPEC 1.016 12/05/2019 2344   PHURINE 6.0 12/05/2019 2344   GLUCOSEU >=500 (A) 12/05/2019 2344   HGBUR MODERATE (A) 12/05/2019 2344   BILIRUBINUR NEGATIVE 12/05/2019 2344   KETONESUR 20 (A) 12/05/2019 2344   PROTEINUR 100 (A) 12/05/2019 2344   NITRITE NEGATIVE 12/05/2019 2344   LEUKOCYTESUR NEGATIVE 12/05/2019 2344      Imaging Results:    DG Chest 2 View  Result Date: 10/08/2020 CLINICAL DATA:  Chest pain negative COVID test EXAM: CHEST - 2 VIEW COMPARISON:  12/05/2019 FINDINGS: Heart size and vascularity normal. Negative for heart failure. Mild elevation right hemidiaphragm mild right lower lobe atelectasis, unchanged. No acute infiltrate or effusion. IMPRESSION: Mild right lower lobe atelectasis.  No acute abnormality. Electronically Signed   By: Marlan Palau M.D.   On: 10/08/2020 13:59   CT Angio Chest PE W and/or Wo Contrast  Result Date: 10/08/2020 CLINICAL DATA:  Lower chest pain EXAM: CT ANGIOGRAPHY CHEST WITH CONTRAST TECHNIQUE: Multidetector CT imaging of the chest was performed using  the standard protocol during bolus administration of intravenous contrast. Multiplanar CT image reconstructions and MIPs were obtained to evaluate the vascular anatomy. CONTRAST:  OMNIPAQUE IOHEXOL 350 MG/ML SOLN COMPARISON:  None. FINDINGS: Cardiovascular: Examination for pulmonary embolism is somewhat limited by breath motion artifact throughout. Within this limitation, no evidence of pulmonary embolism through the proximal segmental pulmonary arterial level. Cardiomegaly. Left and right coronary artery calcifications. No pericardial effusion. Aortic atherosclerosis. Mediastinum/Nodes: No enlarged mediastinal, hilar, or axillary lymph nodes. Thyroid gland, trachea, and esophagus demonstrate no significant findings. Lungs/Pleura: Bandlike scarring of the right lung base. No pleural effusion or pneumothorax. Upper Abdomen: Please see separately reported CT examination of the abdomen and pelvis. Musculoskeletal: No chest wall abnormality. No acute or significant osseous findings. Review of the MIP images confirms the above findings. IMPRESSION: 1. Examination for pulmonary embolism is somewhat limited by breath motion artifact throughout. Within this limitation, no evidence of pulmonary embolism through the proximal segmental pulmonary arterial level. 2. Bandlike scarring of the right lung base. No acute appearing airspace opacity. 3. Cardiomegaly and coronary artery disease. Aortic Atherosclerosis (ICD10-I70.0). Electronically Signed   By: Lauralyn Primes M.D.   On: 10/08/2020 18:03   CT ABDOMEN PELVIS W CONTRAST  Result Date: 10/08/2020 CLINICAL DATA:  Severe epigastric pain EXAM: CT ABDOMEN AND PELVIS WITH CONTRAST TECHNIQUE: Multidetector CT imaging of the abdomen and pelvis was performed using the standard protocol following bolus administration of intravenous contrast. CONTRAST:  OMNIPAQUE IOHEXOL 350 MG/ML SOLN COMPARISON:  MR abdomen, 05/14/2020, CT abdomen pelvis, 12/05/2019 FINDINGS: Lower  chest: No acute abnormality. Hepatobiliary: No focal liver abnormality is seen. Hepatic steatosis. Status post cholecystectomy. No biliary  dilatation. Pancreas: Unremarkable. No pancreatic ductal dilatation or surrounding inflammatory changes. Spleen: Normal in size without significant abnormality. Adrenals/Urinary Tract: Adrenal glands are unremarkable. Multiple low-attenuation lesions of the kidneys bilaterally, previously characterized as cysts and better evaluated by MRI. Kidneys are otherwise normal, without renal calculi, solid lesion, or hydronephrosis. Bladder is unremarkable. Stomach/Bowel: Stomach is within normal limits. Appendix appears normal. No evidence of bowel wall thickening, distention, or inflammatory changes. Sigmoid diverticula. Vascular/Lymphatic: Aortic atherosclerosis. No enlarged abdominal or pelvic lymph nodes. Reproductive: No mass or other significant abnormality. Other: Left greater than right fat containing bilateral inguinal hernias. No abdominopelvic ascites. Musculoskeletal: No acute or significant osseous findings. IMPRESSION: 1. No acute CT findings of the abdomen or pelvis to explain epigastric pain. 2. Hepatic steatosis. 3. Status post cholecystectomy. 4. Sigmoid diverticulosis without evidence of acute diverticulitis. Aortic Atherosclerosis (ICD10-I70.0). Electronically Signed   By: Lauralyn PrimesAlex  Bibbey M.D.   On: 10/08/2020 18:08    My personal review of EKG: Rhythm NSR, Rate 59/min, QTc 455,no Acute ST changes   Assessment & Plan:    Principal Problem:   Unstable angina (HCC) Active Problems:   Hypertension   Hypokalemia   Type 2 diabetes mellitus (HCC)   CAD (coronary artery disease)   GERD (gastroesophageal reflux disease)   1. Unstable angina and CAD 1. Pain feels exactly like previous MI 2. Troponins 3, 3, 6, and 15 3. EKG = HR 89, SR, QTc 455 4. Cardiology consulted and rec's admission for unstable angina 5. Monitor on tele 6. Continue heparin  drip 7. Continue aspirin, metoprolol. and ARB 8. A1C and lipid panel in the AM 2. HTN 1. Continue ARB and metoprolol 3. Hypokalemia 1. Replace and recheck 4. Type 2 DM 1. A1C pending 2. 45 units BID at home, continue 40 units BID with sliding scale coverage 5. GERD 1. Continue PPI 2. Currently complaining of epigastric pain 3. GI cocktail given in ED 4. Lipase 27   DVT Prophylaxis-   heparin - SCDs  AM Labs Ordered, also please review Full Orders  Family Communication: No family at bedside Code Status:  Full  Admission status: Observation  Time spent in minutes : 65   Clairessa Boulet B Zierle-Ghosh DO

## 2020-10-09 NOTE — Progress Notes (Signed)
CARDIAC REHAB PHASE I   Went to complete preop education with pt. Pt out of room. Materials left at bedside. Will continue to follow.  Reynold Bowen, RN BSN 10/09/2020 2:21 PM

## 2020-10-09 NOTE — Progress Notes (Signed)
ANTICOAGULATION CONSULT NOTE - Follow Up Consult  Pharmacy Consult for heparin  Indication: ACS/STEMI  Allergies  Allergen Reactions  . Statins     Muscle aches, liver pain; Tried Atorvastatin, Crestor, Pravastatin, and Simvastatin    Patient Measurements: Height: 6\' 2"  (188 cm) Weight: 116.2 kg (256 lb 1.6 oz) IBW/kg (Calculated) : 82.2 Heparin Dosing Weight: 105kg  Vital Signs: Temp: 97.9 F (36.6 C) (05/03 0837) Temp Source: Oral (05/03 0837) BP: 150/94 (05/03 1215) Pulse Rate: 80 (05/03 1215)  Labs: Recent Labs    10/08/20 1342 10/08/20 1523 10/08/20 1954 10/08/20 2154 10/09/20 0556  HGB 17.3*  --   --   --  16.3  HCT 49.7  --   --   --  46.1  PLT 171  --   --   --  185  HEPARINUNFRC  --   --   --   --  0.11*  CREATININE 0.93  --   --   --  1.10  TROPONINIHS 3 3 6 15   --     Estimated Creatinine Clearance: 83.5 mL/min (by C-G formula based on SCr of 1.1 mg/dL).   Medical History: Past Medical History:  Diagnosis Date  . CAD (coronary artery disease)    a. mild nonobstructive CAD by cath in 2010 b. NST in 11/2015 showing a fixed defect with no ischemia and Coronary CT showing nonobstructive CAD along LAD and LCx c. 08/2018: s/p NSTEMI with cath showing acute total occlusion of LCx (treated with DES) and nonobstructive CAD along RCA and LAD.   . Diabetes mellitus without complication (HCC)   . Diverticulitis   . Gallstones   . Hypertension   . IDA (iron deficiency anemia)   . MI (myocardial infarction) (HCC)   . PSVT (paroxysmal supraventricular tachycardia) (HCC)   . Restless leg syndrome   . Sleep apnea     Assessment: 70 yo with h/o CAD presents with chest pain. Pharmacy consulted to dose heparin. Stated weight 118kg. No AC PTA noted.  S/p cath with 2V CAD and CABG evaluation Resume heparin drip 8hr post sheath removal Sheath out 1230 resume 2030 Heparin drip rate 1800 uts/hr    Goal of Therapy:  Heparin level 0.3-0.7 units/ml Monitor  platelets by anticoagulation protocol: Yes   Plan:  Heparin drip 1800 uts/hr  - start at 8:30pm Heparin level and CBC daily Monitor for bleeding.     09/2018 Pharm.D. CPP, BCPS Clinical Pharmacist [phone] 10/09/2020 2:18 PM    Please utilize Amion for appropriate phone number to reach the unit pharmacist [facility])   10/09/2020,2:16 PM

## 2020-10-09 NOTE — Progress Notes (Signed)
Paged by nurse that clonidine held this AM as patient stated he no longer takes this - has not taken in a few months due to orthostasis. Will dc from hospital MAR.

## 2020-10-09 NOTE — Progress Notes (Signed)
**Note De-Identified via Morrow** PROGRESS NOTE  [name]  DOB: December 09, 1950  PCP: Benita Stabile, MD BWL:[id number]  DOA: 10/08/2020  LOS: 0 days   Chief Complaint  Patient presents with  . Chest Pain   Brief narrative: Russell Morrow is a 70 y.o. male with PMH significant for DM2, HTN, sleep apnea, CAD/MI, paroxysmal SVT.   Patient presented to the ED on 10/08/2020 with complaint of chest pain, centrally located, constant, sharp, associated with vomiting 3-4 times. In the ED, patient was hemodynamically stable.  Troponin was elevated and trended up. Chest pain was treated with IV morphine.  Patient was started on heparin drip and admitted to hospitalist service. Cardiology consultation was obtained.  Subjective: Patient was seen and examined this morning.  Pleasant elderly Caucasian male.  Propped up in bed.  Not in distress.  Wife at bedside. Noted a plan for cardiac cath today. Chart reviewed Afebrile, blood pressure in 150s this morning Labs this morning with blood sugar level 177,  Assessment/Plan: Unstable angina History of CAD -Presented with chest pain which was similar to chest pain when he had MI in the past -Per history, his cath in March 2020 showed thrombotic proximal left circumflex occlusion for which he underwent PCI with residual nonobstructive ostial LAD disease. -Noted a plan for cardiac cath today. -Currently on heparin drip and is NPO. Recent Labs    10/08/20 1523 10/08/20 1954 10/08/20 2154  TROPONINIHS 3 6 15    Type 2 diabetes mellitus -A1c 6.7 on 5/3 -Home meds include Lantus 40 units twice daily, Trulicity 1.5 mg weekly.  ?Not on Farxiga. -Currently on Lantus 40 units nightly and sliding scale insulin with Accu-Cheks Recent Labs  Lab 10/08/20 1440 10/09/20 0841  GLUCAP 230* 177*   Essential hypertension Chronic diastolic CHF -Home meds include Toprol 100 mg daily, Lasix 40 mg daily, clonidine 0.1 mg twice daily, olmesartan 40 mg daily, Aldactone 25 mg daily. -Keep Lasix on hold  precath.  Continue others.  Hypokalemia Recent Labs  Lab 10/08/20 1342 10/09/20 0556  K 3.4* 3.8  MG  --  2.0   GERD -Continue PPI  Mobility: Encourage ambulation Code Status:   Code Status: Full Code  Nutritional status: Body mass index is 32.88 kg/m.     Diet Order            Diet NPO time specified Except for: Sips with Meds  Diet effective now                 DVT prophylaxis: SCDs Start: 10/09/20 0506   Antimicrobials:  None Fluid: On fluid prior to cath Consultants: Cardiology Family Communication:  Wife at bedside  Status is: Inpatient  Remains inpatient appropriate because: Needs cardiac cath  Dispo: The patient is from: Home              Anticipated d/c is to: Home in 1 to 2 days.  Depends on cardiac cath findings              Patient currently is not medically stable to d/c.   Difficult to place patient No     Infusions:  . sodium chloride    . sodium chloride    . heparin 1,800 Units/hr (10/09/20 1037)    Scheduled Meds: . [START ON 10/10/2020] aspirin EC  81 mg Oral Daily  . cloNIDine  0.1 mg Oral BID  . ezetimibe  10 mg Oral Daily  . insulin aspart  0-15 Units Subcutaneous TID WC  . insulin aspart  0-5 Units Subcutaneous QHS  . insulin glargine  40 Units Subcutaneous QHS  . irbesartan  300 mg Oral Daily  . metoprolol succinate  125 mg Oral Daily  . pantoprazole (PROTONIX) IV  40 mg Intravenous Q24H  . pregabalin  100 mg Oral QHS  . rOPINIRole  2 mg Oral QHS  . sodium chloride flush  3 mL Intravenous Q12H  . temazepam  15 mg Oral QHS    Antimicrobials: Anti-infectives (From admission, onward)   None      PRN meds: sodium chloride, acetaminophen, ondansetron (ZOFRAN) IV, sodium chloride flush, temazepam   Objective: Vitals:   10/09/20 0837 10/09/20 0858  BP: (!) 158/88   Pulse: 73   Resp: 20 (!) 25  Temp: 97.9 F (36.6 C)   SpO2: 96% 99%    Intake/Output Summary (Last 24 hours) at 10/09/2020 1111 Last data filed at  10/09/2020 0711 Gross per 24 hour  Intake --  Output 475 ml  Net -475 ml   Filed Weights   10/08/20 2006 10/09/20 0017 10/09/20 0327  Weight: 117.9 kg 116.2 kg 116.2 kg   Weight change:  Body mass index is 32.88 kg/m.   Physical Exam: General exam: Pleasant, elderly Caucasian male.  Not in distress Skin: No rashes, lesions or ulcers. HEENT: Atraumatic, normocephalic, no obvious bleeding Lungs: Clear to auscultation bilaterally CVS: Regular rate and rhythm, no murmur GI/Abd soft, nontender, nondistended, bowel sound present CNS: Alert, awake, oriented x3 Psychiatry: Mood appropriate Extremities: No pedal edema, no calf tenderness  Data Review: I have personally reviewed the laboratory data and studies available.  Recent Labs  Lab 10/08/20 1342 10/09/20 0556  WBC 11.4* 12.7*  HGB 17.3* 16.3  HCT 49.7 46.1  MCV 94.5 93.1  PLT 171 185   Recent Labs  Lab 10/08/20 1342 10/09/20 0556  NA 138 139  K 3.4* 3.8  CL 98 97*  CO2 28 32  GLUCOSE 222* 136*  BUN 22 17  CREATININE 0.93 1.10  CALCIUM 9.4 9.1  MG  --  2.0    F/u labs ordered Unresulted Labs (From admission, onward)          Start     Ordered   10/10/20 0500  Heparin level (unfractionated)  Daily,   R      10/08/20 2013   10/10/20 0500  Basic metabolic panel  Tomorrow morning,   R        10/09/20 0841   10/09/20 1300  Heparin level (unfractionated)  Once-Timed,   TIMED        10/09/20 0655   10/09/20 0506  Urinalysis, Routine w reflex microscopic  Once,   R        10/09/20 0505   10/09/20 0500  CBC  Daily,   R      10/08/20 2013   10/08/20 2051  SARS CORONAVIRUS 2 (TAT 6-24 HRS) Nasopharyngeal Nasopharyngeal Swab  (Tier 3 - Symptomatic/asymptomatic)  Once,   R       Question Answer Comment  Is this test for diagnosis or screening Screening   Symptomatic for COVID-19 as defined by CDC No   Hospitalized for COVID-19 No   Admitted to ICU for COVID-19 No   Previously tested for COVID-19 Yes   Resident  in a congregate (group) care setting No   Employed in healthcare setting No   Has patient completed COVID vaccination(s) (2 doses of Pfizer/Moderna 1 dose of [employer]) Unknown      10/08/20 2050  Signed, Lorin Glass, MD [facility] 10/09/2020

## 2020-10-09 NOTE — Progress Notes (Signed)
Interval cardiology attending note:  INFORMED CONSENT: I have reviewed the risks, indications, and alternatives to cardiac catheterization, possible angioplasty, and stenting with the patient. Risks include but are not limited to bleeding, infection, vascular injury, stroke, myocardial infection, arrhythmia, kidney injury, radiation-related injury in the case of prolonged fluoroscopy use, emergency cardiac surgery, and death. The patient understands the risks of serious complication is 1-2 in 1000 with diagnostic cardiac cath and 1-2% or less with angioplasty/stenting.   Patient consented for cath this AM. 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 Discussed in detail as above. His wife Dois Davenport worked in medical transcription for 35 years.   Hx CAD with LHC 08/2018 with thrombotic prox L circ occlusion. Residual LAD disease.   Plan for cath today, discussed with cath lab and Dayna Dunn PA-C for orders.   Parke Poisson, MD 8:40 AM 10/09/20

## 2020-10-09 NOTE — Progress Notes (Signed)
TR BAND REMOVAL  LOCATION:  right radial  DEFLATED PER PROTOCOL:  Yes.    TIME BAND OFF / DRESSING APPLIED:   1530   SITE UPON ARRIVAL:   Level 1  SITE AFTER BAND REMOVAL:  Level 1  CIRCULATION SENSATION AND MOVEMENT:  Within Normal Limits  Yes.    COMMENTS:    

## 2020-10-09 NOTE — Consult Note (Addendum)
[address]       [place] [address]             [phone]        [facility]ell [facility] Medical Record #[id number] Date of Birth: January 01, 1951  Referring: No ref. provider found Primary Care: Benita Stabile, MD Primary Cardiologist:Branch, Christiane Ha, MD  Chief Complaint:    Chief Complaint  Patient presents with  . Chest Pain    History of Present Illness:       [name] is a 70 year old male patient with past medical history significant for paroxysmal supraventricular tachycardia, hypertension, marijuana use, and diabetes mellitus who presented to the ED with a chief complaint of chest pain. The patient reports that the pain is exactly how it was back in March of 2020 when he has his first MI. He states that it feels like a knot in the subxiphoid area which is constant and sometime sharp. He reports that he takes Nexium twice a day for his acid reflux and at first it would help with the pain but only short term. He did have some nausea with vomiting.  Patient also reports he has associated dyspnea at rest which had been going on for months. The chest pain that brought him to the hospital started around 10am on 10/08/2020. He was taken to the cath lab and his cardiac catheterization showed: ostial to proximal LAS lesion of 40%, proximal RCA lesion of 40%, proximal LAD to mid LAD sternosis of 100%, proximal Circumflex stenosis of 90%, and an estimated LVEF of 55-65%. Given the multivessel CAD in a diabetic patient, Cardiology recommended consideration for CABG. We are consulted for possible surgical revascularization.   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 He lives in [place]. He has a couple of alcohol drinks per night but has never had any withdrawal symptoms. His father underwent CABG at age 55 and lived into his 33s. His mother also lives into her 90s without any known cardiac disease.  He was independent at home completing all tasks of  daily living.   Current Activity/ Functional Status: Patient was independent with mobility/ambulation, transfers, ADL's, IADL's.   Zubrod Score: At the time of surgery this patient's most appropriate activity status/level should be described as: []     0    Normal activity, no symptoms [x]     1    Restricted in physical strenuous activity but ambulatory, able to do out light work []     2    Ambulatory and capable of self care, unable to do work activities, up and about                 more than 50%  Of the time                            []     3    Only limited self care, in bed greater than 50% of waking hours []     4    Completely disabled, no self care, confined to bed or chair []     5    Moribund  Past Medical History:  Diagnosis Date  . CAD (coronary artery disease)    a. mild nonobstructive CAD by cath in 2010 b. NST in 11/2015 showing a fixed defect with no ischemia and Coronary CT showing nonobstructive CAD along LAD and LCx c. 08/2018: s/p NSTEMI  with cath showing acute total occlusion of LCx (treated with DES) and nonobstructive CAD along RCA and LAD.   . Diabetes mellitus without complication (HCC)   . Diverticulitis   . Gallstones   . Hypertension   . IDA (iron deficiency anemia)   . MI (myocardial infarction) (HCC)   . PSVT (paroxysmal supraventricular tachycardia) (HCC)   . Restless leg syndrome   . Sleep apnea     Past Surgical History:  Procedure Laterality Date  . CERVICAL FUSION    . CHOLECYSTECTOMY N/A 01/31/2020   Procedure: LAPAROSCOPIC CHOLECYSTECTOMY;  Surgeon: Franky MachoJenkins, Mark, MD;  Location: [facility];  Service: General;  Laterality: N/A;  . CORONARY STENT INTERVENTION N/A 08/13/2018   Procedure: CORONARY STENT INTERVENTION;  Surgeon: Tonny Bollmanooper, Michael, MD;  [facility];  Service: Cardiovascular;  Laterality: N/A;  . LEFT HEART CATH AND CORONARY ANGIOGRAPHY N/A 08/13/2018   Procedure: LEFT HEART CATH AND CORONARY ANGIOGRAPHY;  Surgeon: Tonny Bollmanooper, Michael,  MD;  [facility];  Service: Cardiovascular;  Laterality: N/A;  . LEFT HEART CATH AND CORONARY ANGIOGRAPHY N/A 10/09/2020   Procedure: LEFT HEART CATH AND CORONARY ANGIOGRAPHY;  Surgeon: SwazilandJordan, Peter M, MD;  [facility];  Service: Cardiovascular;  Laterality: N/A;  . nasal septum repair      Social History   Tobacco Use  Smoking Status Never Smoker  Smokeless Tobacco Never Used  Tobacco Comment   since age 70 - marijuana     Social History   Substance and Sexual Activity  Alcohol Use Not Currently  . Alcohol/week: 14.0 standard drinks  . Types: 14 Glasses of wine per week   Comment: wine usually daily     Allergies  Allergen Reactions  . Statins     Muscle aches, liver pain; Tried Atorvastatin, Crestor, Pravastatin, and Simvastatin    Current Facility-Administered Medications  Medication Dose Route Frequency Provider Last Rate Last Admin  . 0.9 %  sodium chloride infusion  250 mL Intravenous PRN [place]Jordan, Peter M, MD      . 0.9% sodium chloride infusion  1 mL/kg/hr Intravenous Continuous [place]Jordan, Peter M, MD 116.2 mL/hr at 10/09/20 1230 1 mL/kg/hr at 10/09/20 1230  . acetaminophen (TYLENOL) tablet 650 mg  650 mg Oral Q4H PRN [place]Jordan, Peter M, MD      . Melene Muller[START ON 10/10/2020] aspirin EC tablet 81 mg  81 mg Oral Daily [place]andJordan, Peter M, MD      . ezetimibe (ZETIA) tablet 10 mg  10 mg Oral Daily [place]Jordan, Peter M, MD   10 mg at 10/09/20 1041  . insulin aspart (novoLOG) injection 0-15 Units  0-15 Units Subcutaneous TID WC [place]andJordan, Peter M, MD   2 Units at 10/09/20 1326  . insulin aspart (novoLOG) injection 0-5 Units  0-5 Units Subcutaneous QHS [place]andJordan, Peter M, MD      . insulin glargine (LANTUS) injection 40 Units  40 Units Subcutaneous QHS [place]andJordan, Peter M, MD      . irbesartan Evlyn Kanner(AVAPRO) tablet 300 mg  300 mg Oral Daily [place]Jordan, Peter M, MD   300 mg at 10/09/20 1041  . metoprolol succinate (TOPROL-XL) 24 hr tablet 125 mg  125 mg Oral Daily [place]Jordan, Peter M, MD    125 mg at 10/09/20 1040  . ondansetron (ZOFRAN) injection 4 mg  4 mg Intravenous Q6H PRN [place]Jordan, Peter M, MD      . pantoprazole (PROTONIX) injection 40 mg  40 mg Intravenous Q24H [place]Jordan, Peter M, MD      .  pregabalin (LYRICA) capsule 100 mg  100 mg Oral QHS [place], Peter M, MD      . rOPINIRole (REQUIP) tablet 2 mg  2 mg Oral QHS [place], Peter M, MD      . sodium chloride flush (NS) 0.9 % injection 3 mL  3 mL Intravenous Q12H [place], Peter M, MD      . sodium chloride flush (NS) 0.9 % injection 3 mL  3 mL Intravenous Q12H [place], Peter M, MD      . sodium chloride flush (NS) 0.9 % injection 3 mL  3 mL Intravenous PRN [place], Peter M, MD      . temazepam (RESTORIL) capsule 15 mg  15 mg Oral QHS [place], Peter M, MD        Medications Prior to Admission  Medication Sig Dispense Refill Last Dose  . acetaminophen (TYLENOL) 500 MG tablet Take 1,000 mg by mouth every 6 (six) hours as needed for moderate pain.    10/07/2020  . amLODipine (NORVASC) 10 MG tablet Take 1 tablet (10 mg total) by mouth daily. 90 tablet 3 10/08/2020 at Unknown time  . aspirin EC 81 MG EC tablet Take 1 tablet (81 mg total) by mouth daily.   10/07/2020  . diclofenac Sodium (VOLTAREN) 1 % GEL Apply 2 g topically 4 (four) times daily as needed (pain).   10/08/2020 at Unknown time  . dicyclomine (BENTYL) 10 MG capsule Take 10 mg by mouth 4 (four) times daily as needed for spasms.    10/07/2020  . esomeprazole (NEXIUM) 20 MG capsule Take 20 mg by mouth daily as needed (acid reflux).   10/06/2020  . ezetimibe (ZETIA) 10 MG tablet Take 10 mg by mouth daily.   10/07/2020  . FEROSUL 325 (65 Fe) MG tablet Take 325 mg by mouth 2 (two) times daily.    10/08/2020 at Unknown time  . furosemide (LASIX) 40 MG tablet Take 1 tablet (40 mg total) by mouth 2 (two) times daily. 180 tablet 3 10/08/2020 at Unknown time  . HUMALOG KWIKPEN 100 UNIT/ML KwikPen Inject 10-16 Units into the skin with breakfast, with lunch, and with evening meal. Sliding Scale:  CBG  140-180 = 10 units, 181-220 = 12 units, 221-260 = 14 units, 261-300 = 16 units , call MD over 300   10/07/2020  . ibuprofen (ADVIL) 200 MG tablet Take 200 mg by mouth every 6 (six) hours as needed for moderate pain.    Past Week at Unknown time  . LANTUS SOLOSTAR 100 UNIT/ML Solostar Pen Inject 35 Units into the skin 2 (two) times daily. (Patient taking differently: Inject 40 Units into the skin 2 (two) times daily.) 15 mL 11 10/08/2020 at Unknown time  . metoprolol succinate (TOPROL-XL) 100 MG 24 hr tablet Take 100 mg by mouth daily.   10/07/2020 at 1900  . olmesartan (BENICAR) 40 MG tablet Take 1 tablet (40 mg total) by mouth daily. 90 tablet 3 10/08/2020 at Unknown time  . potassium chloride SA (KLOR-CON) 20 MEQ tablet Take 2 tablets (40 mEq total) by mouth daily. (Patient taking differently: Take 20 mEq by mouth 2 (two) times daily.) 180 tablet 3 10/08/2020 at Unknown time  . pregabalin (LYRICA) 100 MG capsule Take 100 mg by mouth at bedtime.   10/07/2020  . rOPINIRole (REQUIP) 1 MG tablet Take 2 mg by mouth at bedtime.   10/07/2020  . spironolactone (ALDACTONE) 25 MG tablet Take 1 tablet (25 mg total) by mouth daily. 90 tablet 3 10/08/2020 at  Unknown time  . tadalafil (CIALIS) 10 MG tablet Take 10 mg by mouth daily as needed for erectile dysfunction.    unk  . temazepam (RESTORIL) 15 MG capsule Take 15 mg by mouth at bedtime.   10/07/2020  . TRULICITY 0.75 MG/0.5ML SOPN Inject 1.5 mg into the skin once a week. Friday   10/05/2020  . BD PEN NEEDLE NANO U/F 32G X 4 MM MISC Inject into the skin.       Family History  Problem Relation Age of Onset  . Cancer Mother        breast  . Hypertension Mother   . Diabetes Father   . Heart disease Father   . Hypertension Father   . Diabetes Brother   . Heart disease Brother   . Hypertension Brother   . Colon cancer Neg Hx      Review of Systems:   Review of Systems  Constitutional: Positive for malaise/fatigue.  HENT: Negative.   Respiratory: Positive for  shortness of breath.   Cardiovascular: Positive for chest pain and leg swelling (4+ pitting edema reported).  Gastrointestinal: Positive for heartburn, nausea and vomiting.  Neurological: Negative.   Psychiatric/Behavioral: Positive for substance abuse (marijuana, alcohol).   Pertinent items are noted in HPI.    Physical Exam: BP (!) 150/94   Pulse 80   Temp 97.9 F (36.6 C) (Oral)   Resp 12   Ht  (1.88 m)   Wt 116.2 kg   SpO2 99%   BMI 32.88 kg/m    General appearance: alert, cooperative and no distress Resp: clear to auscultation bilaterally Cardio: regular rate and rhythm, S1, S2 normal, no murmur, click, rub or gallop GI: soft, non-tender; bowel sounds normal; no masses,  no organomegaly and hyperactive bowel sounds Extremities: extremities normal, atraumatic, no cyanosis or edema Neurologic: Grossly normal  Diagnostic Studies & Laboratory data:     Recent Radiology Findings:   DG Chest 2 View  Result Date: 10/08/2020 CLINICAL DATA:  Chest pain negative COVID test EXAM: CHEST - 2 VIEW COMPARISON:  12/05/2019 FINDINGS: Heart size and vascularity normal. Negative for heart failure. Mild elevation right hemidiaphragm mild right lower lobe atelectasis, unchanged. No acute infiltrate or effusion. IMPRESSION: Mild right lower lobe atelectasis.  No acute abnormality. Electronically Signed   By: Marlan Palau M.D.   On: 10/08/2020 13:59   CT Angio Chest PE W and/or Wo Contrast  Result Date: 10/08/2020 CLINICAL DATA:  Lower chest pain EXAM: CT ANGIOGRAPHY CHEST WITH CONTRAST TECHNIQUE: Multidetector CT imaging of the chest was performed using the standard protocol during bolus administration of intravenous contrast. Multiplanar CT image reconstructions and MIPs were obtained to evaluate the vascular anatomy. CONTRAST:  OMNIPAQUE IOHEXOL 350 MG/ML SOLN COMPARISON:  None. FINDINGS: Cardiovascular: Examination for pulmonary embolism is somewhat limited by breath motion  artifact throughout. Within this limitation, no evidence of pulmonary embolism through the proximal segmental pulmonary arterial level. Cardiomegaly. Left and right coronary artery calcifications. No pericardial effusion. Aortic atherosclerosis. Mediastinum/Nodes: No enlarged mediastinal, hilar, or axillary lymph nodes. Thyroid gland, trachea, and esophagus demonstrate no significant findings. Lungs/Pleura: Bandlike scarring of the right lung base. No pleural effusion or pneumothorax. Upper Abdomen: Please see separately reported CT examination of the abdomen and pelvis. Musculoskeletal: No chest wall abnormality. No acute or significant osseous findings. Review of the MIP images confirms the above findings. IMPRESSION: 1. Examination for pulmonary embolism is somewhat limited by breath motion artifact throughout. Within this limitation, no evidence of  pulmonary embolism through the proximal segmental pulmonary arterial level. 2. Bandlike scarring of the right lung base. No acute appearing airspace opacity. 3. Cardiomegaly and coronary artery disease. Aortic Atherosclerosis (ICD10-I70.0). Electronically Signed   By: Lauralyn Primes M.D.   On: 10/08/2020 18:03   CT ABDOMEN PELVIS W CONTRAST  Result Date: 10/08/2020 CLINICAL DATA:  Severe epigastric pain EXAM: CT ABDOMEN AND PELVIS WITH CONTRAST TECHNIQUE: Multidetector CT imaging of the abdomen and pelvis was performed using the standard protocol following bolus administration of intravenous contrast. CONTRAST:  OMNIPAQUE IOHEXOL 350 MG/ML SOLN COMPARISON:  MR abdomen, 05/14/2020, CT abdomen pelvis, 12/05/2019 FINDINGS: Lower chest: No acute abnormality. Hepatobiliary: No focal liver abnormality is seen. Hepatic steatosis. Status post cholecystectomy. No biliary dilatation. Pancreas: Unremarkable. No pancreatic ductal dilatation or surrounding inflammatory changes. Spleen: Normal in size without significant abnormality. Adrenals/Urinary Tract: Adrenal glands  are unremarkable. Multiple low-attenuation lesions of the kidneys bilaterally, previously characterized as cysts and better evaluated by MRI. Kidneys are otherwise normal, without renal calculi, solid lesion, or hydronephrosis. Bladder is unremarkable. Stomach/Bowel: Stomach is within normal limits. Appendix appears normal. No evidence of bowel wall thickening, distention, or inflammatory changes. Sigmoid diverticula. Vascular/Lymphatic: Aortic atherosclerosis. No enlarged abdominal or pelvic lymph nodes. Reproductive: No mass or other significant abnormality. Other: Left greater than right fat containing bilateral inguinal hernias. No abdominopelvic ascites. Musculoskeletal: No acute or significant osseous findings. IMPRESSION: 1. No acute CT findings of the abdomen or pelvis to explain epigastric pain. 2. Hepatic steatosis. 3. Status post cholecystectomy. 4. Sigmoid diverticulosis without evidence of acute diverticulitis. Aortic Atherosclerosis (ICD10-I70.0). Electronically Signed   By: Lauralyn Primes M.D.   On: 10/08/2020 18:08   CARDIAC CATHETERIZATION  Result Date: 10/09/2020  Ost LAD to Prox LAD lesion is 40% stenosed.  Previously placed Prox Cx to Mid Cx drug eluting stent is widely patent.  RPAV lesion is 30% stenosed.  Prox RCA lesion is 40% stenosed.  Prox LAD to Mid LAD lesion is 100% stenosed.  Prox Cx lesion is 90% stenosed.  The left ventricular systolic function is normal.  LV end diastolic pressure is normal.  The left ventricular ejection fraction is 55-65% by visual estimate.  1. Severe 2 vessel obstructive/occlusive CAD. There is occlusion of the mid LAD immediately after the first diagonal. The LAD fills well by right to left collaterals. There is severe segmental disease in the proximal LCx prior to the previous stent 2. Normal LV function 3. 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     I have independently reviewed the above  radiologic studies and discussed with the patient   Recent Lab Findings: Lab Results  Component Value Date   WBC 12.7 (H) 10/09/2020   HGB 16.3 10/09/2020   HCT 46.1 10/09/2020   PLT 185 10/09/2020   GLUCOSE 136 (H) 10/09/2020   CHOL 130 10/09/2020   TRIG 133 10/09/2020   HDL 34 (L) 10/09/2020   LDLCALC 69 10/09/2020   ALT 42 10/09/2020   AST 23 10/09/2020   NA 139 10/09/2020   K 3.8 10/09/2020   CL 97 (L) 10/09/2020   CREATININE 1.10 10/09/2020   BUN 17 10/09/2020   CO2 32 10/09/2020   TSH 0.589 07/29/2018   INR 1.06 07/28/2018   HGBA1C 6.7 (H) 10/09/2020      Assessment / Plan:      1. NSTEMI/multivessel coronary artery disease-statin intolerance, continue asa, heparin, BB 2. Hypertension- started back on his  Avapro 3. Paroxysmal supraventricular tachycardia-continue Toprol-XL 4. Diabetes Mellitus Type 2-Continue Insulin coverage. A1C is patient reported as 7  5. GERD-He takes Nexium BID at home 7. Preserved EF- 60-65% on last Echocardiogram on 06/20/2020, Patient does report Lower ext edema and takes Lasix twice a day.   Plan: Coronary artery bypass grafting planning for tomorrow. 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 Dr. Vickey Sages to discuss further with the patient. All questions were answered to the patient's satisfaction. Due to his HF symptoms of lower ext edema and SOB, a repeat Echocardiogram may be indicated.   I  spent 30 minutes counseling the patient face to face.   Litzy Dicker,PA-C 10/09/2020 1:40 PM

## 2020-10-09 NOTE — Progress Notes (Signed)
[facility] consulted for CABG evaluation. °

## 2020-10-09 NOTE — Progress Notes (Signed)
Pt arrived from [facility] via [facility]. Pt without complaints or CP at this time. Heparin infusing per previous order. VS stable. Pt oriented to unit. MD on call paged to inform of pts arrival. Dierdre Highman, RN

## 2020-10-10 ENCOUNTER — Encounter (HOSPITAL_COMMUNITY): Payer: Self-pay | Admitting: Family Medicine

## 2020-10-10 ENCOUNTER — Inpatient Hospital Stay (HOSPITAL_COMMUNITY)
Admission: EM | Disposition: A | Discharge status: Home / Self Care | Payer: Self-pay | Source: Home / Self Care | Attending: Cardiothoracic Surgery

## 2020-10-10 ENCOUNTER — Inpatient Hospital Stay (HOSPITAL_COMMUNITY): Payer: Medicare Other

## 2020-10-10 ENCOUNTER — Inpatient Hospital Stay (HOSPITAL_COMMUNITY): Payer: Medicare Other | Admitting: Certified Registered"

## 2020-10-10 DIAGNOSIS — I200 Unstable angina: Secondary | ICD-10-CM

## 2020-10-10 DIAGNOSIS — Z951 Presence of aortocoronary bypass graft: Secondary | ICD-10-CM

## 2020-10-10 HISTORY — PX: TEE WITHOUT CARDIOVERSION: SHX5443

## 2020-10-10 HISTORY — PX: CORONARY ARTERY BYPASS GRAFT: SHX141

## 2020-10-10 LAB — CBC
HCT: 46.5 % (ref 39.0–52.0)
HCT: 38.5 % — ABNORMAL LOW (ref 39.0–52.0)
HCT: 38.3 % — ABNORMAL LOW (ref 39.0–52.0)
Hemoglobin: 15.9 g/dL (ref 13.0–17.0)
Hemoglobin: 13.4 g/dL (ref 13.0–17.0)
Hemoglobin: 13.4 g/dL (ref 13.0–17.0)
MCH: 31.9 pg (ref 26.0–34.0)
MCH: 32.9 pg (ref 26.0–34.0)
MCH: 32.7 pg (ref 26.0–34.0)
MCHC: 34.2 g/dL (ref 30.0–36.0)
MCHC: 34.8 g/dL (ref 30.0–36.0)
MCHC: 35 g/dL (ref 30.0–36.0)
MCV: 93.4 fL (ref 80.0–100.0)
MCV: 94.6 fL (ref 80.0–100.0)
MCV: 93.4 fL (ref 80.0–100.0)
Platelets: 174 10*3/uL (ref 150–400)
Platelets: 163 10*3/uL (ref 150–400)
Platelets: 136 10*3/uL — ABNORMAL LOW (ref 150–400)
RBC: 4.98 MIL/uL (ref 4.22–5.81)
RBC: 4.07 MIL/uL — ABNORMAL LOW (ref 4.22–5.81)
RBC: 4.1 MIL/uL — ABNORMAL LOW (ref 4.22–5.81)
RDW: 13.2 % (ref 11.5–15.5)
RDW: 13.3 % (ref 11.5–15.5)
RDW: 13.2 % (ref 11.5–15.5)
WBC: 8.9 10*3/uL (ref 4.0–10.5)
WBC: 12.9 10*3/uL — ABNORMAL HIGH (ref 4.0–10.5)
WBC: 10.2 10*3/uL (ref 4.0–10.5)
nRBC: 0 % (ref 0.0–0.2)
nRBC: 0 % (ref 0.0–0.2)
nRBC: 0 % (ref 0.0–0.2)

## 2020-10-10 LAB — POCT I-STAT 7, (LYTES, BLD GAS, ICA,H+H)
Acid-Base Excess: 2 mmol/L (ref 0.0–2.0)
Acid-Base Excess: 1 mmol/L (ref 0.0–2.0)
Acid-Base Excess: 3 mmol/L — ABNORMAL HIGH (ref 0.0–2.0)
Acid-base deficit: 1 mmol/L (ref 0.0–2.0)
Bicarbonate: 28.5 mmol/L — ABNORMAL HIGH (ref 20.0–28.0)
Bicarbonate: 30.4 mmol/L — ABNORMAL HIGH (ref 20.0–28.0)
Bicarbonate: 28.9 mmol/L — ABNORMAL HIGH (ref 20.0–28.0)
Bicarbonate: 29.1 mmol/L — ABNORMAL HIGH (ref 20.0–28.0)
Calcium, Ion: 1.22 mmol/L (ref 1.15–1.40)
Calcium, Ion: 1.05 mmol/L — ABNORMAL LOW (ref 1.15–1.40)
Calcium, Ion: 1.04 mmol/L — ABNORMAL LOW (ref 1.15–1.40)
Calcium, Ion: 1.06 mmol/L — ABNORMAL LOW (ref 1.15–1.40)
HCT: 45 % (ref 39.0–52.0)
HCT: 36 % — ABNORMAL LOW (ref 39.0–52.0)
HCT: 33 % — ABNORMAL LOW (ref 39.0–52.0)
HCT: 34 % — ABNORMAL LOW (ref 39.0–52.0)
Hemoglobin: 15.3 g/dL (ref 13.0–17.0)
Hemoglobin: 12.2 g/dL — ABNORMAL LOW (ref 13.0–17.0)
Hemoglobin: 11.2 g/dL — ABNORMAL LOW (ref 13.0–17.0)
Hemoglobin: 11.6 g/dL — ABNORMAL LOW (ref 13.0–17.0)
O2 Saturation: 99 %
O2 Saturation: 100 %
O2 Saturation: 100 %
O2 Saturation: 99 %
Potassium: 4 mmol/L (ref 3.5–5.1)
Potassium: 4.2 mmol/L (ref 3.5–5.1)
Potassium: 4.6 mmol/L (ref 3.5–5.1)
Potassium: 5.2 mmol/L — ABNORMAL HIGH (ref 3.5–5.1)
Sodium: 138 mmol/L (ref 135–145)
Sodium: 139 mmol/L (ref 135–145)
Sodium: 136 mmol/L (ref 135–145)
Sodium: 137 mmol/L (ref 135–145)
TCO2: 31 mmol/L (ref 22–32)
TCO2: 32 mmol/L (ref 22–32)
TCO2: 31 mmol/L (ref 22–32)
TCO2: 31 mmol/L (ref 22–32)
pCO2 arterial: 66.9 mmHg (ref 32.0–48.0)
pCO2 arterial: 63 mmHg — ABNORMAL HIGH (ref 32.0–48.0)
pCO2 arterial: 51.8 mmHg — ABNORMAL HIGH (ref 32.0–48.0)
pCO2 arterial: 58.9 mmHg — ABNORMAL HIGH (ref 32.0–48.0)
pH, Arterial: 7.238 — ABNORMAL LOW (ref 7.350–7.450)
pH, Arterial: 7.291 — ABNORMAL LOW (ref 7.350–7.450)
pH, Arterial: 7.299 — ABNORMAL LOW (ref 7.350–7.450)
pH, Arterial: 7.357 (ref 7.350–7.450)
pO2, Arterial: 149 mmHg — ABNORMAL HIGH (ref 83.0–108.0)
pO2, Arterial: 365 mmHg — ABNORMAL HIGH (ref 83.0–108.0)
pO2, Arterial: 159 mmHg — ABNORMAL HIGH (ref 83.0–108.0)
pO2, Arterial: 199 mmHg — ABNORMAL HIGH (ref 83.0–108.0)

## 2020-10-10 LAB — BLOOD GAS, ARTERIAL
Acid-Base Excess: 2.1 mmol/L — ABNORMAL HIGH (ref 0.0–2.0)
Bicarbonate: 26.1 mmol/L (ref 20.0–28.0)
FIO2: 21
O2 Saturation: 93.4 %
Patient temperature: 37
pCO2 arterial: 40.8 mmHg (ref 32.0–48.0)
pH, Arterial: 7.423 (ref 7.350–7.450)
pO2, Arterial: 65.5 mmHg — ABNORMAL LOW (ref 83.0–108.0)

## 2020-10-10 LAB — APTT
aPTT: 42 seconds — ABNORMAL HIGH (ref 24–36)
aPTT: 28 seconds (ref 24–36)

## 2020-10-10 LAB — POCT I-STAT, CHEM 8
BUN: 19 mg/dL (ref 8–23)
BUN: 18 mg/dL (ref 8–23)
BUN: 17 mg/dL (ref 8–23)
BUN: 17 mg/dL (ref 8–23)
Calcium, Ion: 1.18 mmol/L (ref 1.15–1.40)
Calcium, Ion: 1.16 mmol/L (ref 1.15–1.40)
Calcium, Ion: 1.05 mmol/L — ABNORMAL LOW (ref 1.15–1.40)
Calcium, Ion: 1.06 mmol/L — ABNORMAL LOW (ref 1.15–1.40)
Chloride: 101 mmol/L (ref 98–111)
Chloride: 99 mmol/L (ref 98–111)
Chloride: 102 mmol/L (ref 98–111)
Chloride: 99 mmol/L (ref 98–111)
Creatinine, Ser: 0.9 mg/dL (ref 0.61–1.24)
Creatinine, Ser: 0.9 mg/dL (ref 0.61–1.24)
Creatinine, Ser: 0.8 mg/dL (ref 0.61–1.24)
Creatinine, Ser: 0.8 mg/dL (ref 0.61–1.24)
Glucose, Bld: 181 mg/dL — ABNORMAL HIGH (ref 70–99)
Glucose, Bld: 193 mg/dL — ABNORMAL HIGH (ref 70–99)
Glucose, Bld: 157 mg/dL — ABNORMAL HIGH (ref 70–99)
Glucose, Bld: 169 mg/dL — ABNORMAL HIGH (ref 70–99)
HCT: 45 % (ref 39.0–52.0)
HCT: 41 % (ref 39.0–52.0)
HCT: 35 % — ABNORMAL LOW (ref 39.0–52.0)
HCT: 34 % — ABNORMAL LOW (ref 39.0–52.0)
Hemoglobin: 15.3 g/dL (ref 13.0–17.0)
Hemoglobin: 13.9 g/dL (ref 13.0–17.0)
Hemoglobin: 11.9 g/dL — ABNORMAL LOW (ref 13.0–17.0)
Hemoglobin: 11.6 g/dL — ABNORMAL LOW (ref 13.0–17.0)
Potassium: 4.1 mmol/L (ref 3.5–5.1)
Potassium: 4.2 mmol/L (ref 3.5–5.1)
Potassium: 5.8 mmol/L — ABNORMAL HIGH (ref 3.5–5.1)
Potassium: 4.6 mmol/L (ref 3.5–5.1)
Sodium: 138 mmol/L (ref 135–145)
Sodium: 137 mmol/L (ref 135–145)
Sodium: 135 mmol/L (ref 135–145)
Sodium: 137 mmol/L (ref 135–145)
TCO2: 29 mmol/L (ref 22–32)
TCO2: 29 mmol/L (ref 22–32)
TCO2: 27 mmol/L (ref 22–32)
TCO2: 27 mmol/L (ref 22–32)

## 2020-10-10 LAB — MAGNESIUM: Magnesium: 2.9 mg/dL — ABNORMAL HIGH (ref 1.7–2.4)

## 2020-10-10 LAB — BASIC METABOLIC PANEL
Anion gap: 8 (ref 5–15)
Anion gap: 6 (ref 5–15)
BUN: 17 mg/dL (ref 8–23)
BUN: 16 mg/dL (ref 8–23)
CO2: 28 mmol/L (ref 22–32)
CO2: 24 mmol/L (ref 22–32)
Calcium: 8.6 mg/dL — ABNORMAL LOW (ref 8.9–10.3)
Calcium: 7.3 mg/dL — ABNORMAL LOW (ref 8.9–10.3)
Chloride: 101 mmol/L (ref 98–111)
Chloride: 105 mmol/L (ref 98–111)
Creatinine, Ser: 0.94 mg/dL (ref 0.61–1.24)
Creatinine, Ser: 0.78 mg/dL (ref 0.61–1.24)
GFR, Estimated: >60 mL/min (ref >60)
GFR, Estimated: >60 mL/min (ref >60)
Glucose, Bld: 142 mg/dL — ABNORMAL HIGH (ref 70–99)
Glucose, Bld: 116 mg/dL — ABNORMAL HIGH (ref 70–99)
Potassium: 3.8 mmol/L (ref 3.5–5.1)
Potassium: 3.7 mmol/L (ref 3.5–5.1)
Sodium: 137 mmol/L (ref 135–145)
Sodium: 135 mmol/L (ref 135–145)

## 2020-10-10 LAB — POCT I-STAT EG7
Acid-Base Excess: 1 mmol/L (ref 0.0–2.0)
Bicarbonate: 30 mmol/L — ABNORMAL HIGH (ref 20.0–28.0)
Calcium, Ion: 1.1 mmol/L — ABNORMAL LOW (ref 1.15–1.40)
HCT: 38 % — ABNORMAL LOW (ref 39.0–52.0)
Hemoglobin: 12.9 g/dL — ABNORMAL LOW (ref 13.0–17.0)
O2 Saturation: 73 %
Potassium: 4.1 mmol/L (ref 3.5–5.1)
Sodium: 139 mmol/L (ref 135–145)
TCO2: 32 mmol/L (ref 22–32)
pCO2, Ven: 68.8 mmHg — ABNORMAL HIGH (ref 44.0–60.0)
pH, Ven: 7.247 — ABNORMAL LOW (ref 7.250–7.430)
pO2, Ven: 46 mmHg — ABNORMAL HIGH (ref 32.0–45.0)

## 2020-10-10 LAB — PROTIME-INR
INR: 1.1 (ref 0.8–1.2)
INR: 1.3 — ABNORMAL HIGH (ref 0.8–1.2)
Prothrombin Time: 13.9 seconds (ref 11.4–15.2)
Prothrombin Time: 16.1 seconds — ABNORMAL HIGH (ref 11.4–15.2)

## 2020-10-10 LAB — GLUCOSE, CAPILLARY
Glucose-Capillary: 173 mg/dL — ABNORMAL HIGH (ref 70–99)
Glucose-Capillary: 109 mg/dL — ABNORMAL HIGH (ref 70–99)
Glucose-Capillary: 110 mg/dL — ABNORMAL HIGH (ref 70–99)
Glucose-Capillary: 112 mg/dL — ABNORMAL HIGH (ref 70–99)
Glucose-Capillary: 96 mg/dL (ref 70–99)
Glucose-Capillary: 102 mg/dL — ABNORMAL HIGH (ref 70–99)
Glucose-Capillary: 114 mg/dL — ABNORMAL HIGH (ref 70–99)
Glucose-Capillary: 115 mg/dL — ABNORMAL HIGH (ref 70–99)
Glucose-Capillary: 108 mg/dL — ABNORMAL HIGH (ref 70–99)
Glucose-Capillary: 113 mg/dL — ABNORMAL HIGH (ref 70–99)

## 2020-10-10 LAB — ECHO INTRAOPERATIVE TEE
AR max vel: 2.95 cm2
AV Area VTI: 3.35 cm2
AV Area mean vel: 3.16 cm2
AV Mean grad: 4 mmHg
AV Peak grad: 8.3 mmHg
Ao pk vel: 1.44 m/s
Area-P 1/2: 3.19 cm2
Height: 74 in
MV M vel: 4.33 m/s
MV Peak grad: 75 mmHg
Radius: 0.4 cm
S' Lateral: 2.34 cm
Weight: 4003.2 oz

## 2020-10-10 LAB — HEMOGLOBIN AND HEMATOCRIT, BLOOD
HCT: 37.4 % — ABNORMAL LOW (ref 39.0–52.0)
Hemoglobin: 13.2 g/dL (ref 13.0–17.0)

## 2020-10-10 LAB — PLATELET COUNT: Platelets: 174 10*3/uL (ref 150–400)

## 2020-10-10 LAB — HEPARIN LEVEL (UNFRACTIONATED): Heparin Unfractionated: 0.14 IU/mL — ABNORMAL LOW (ref 0.30–0.70)

## 2020-10-10 SURGERY — CORONARY ARTERY BYPASS GRAFTING (CABG)
Anesthesia: General | Site: Chest

## 2020-10-10 MED ORDER — MIDAZOLAM HCL 2 MG/2ML IJ SOLN: 2.0000 mg | INTRAMUSCULAR | Status: DC | PRN

## 2020-10-10 MED ORDER — VANCOMYCIN HCL IN DEXTROSE 1-5 GM/200ML-% IV SOLN
1000.0000 mg | Freq: Once | INTRAVENOUS | Status: AC
  Administered 2020-10-10: 1000 mg via INTRAVENOUS
  Filled 2020-10-10: qty 200

## 2020-10-10 MED ORDER — SODIUM CHLORIDE 0.9% FLUSH
10.0000 mL | Freq: Two times a day (BID) | INTRAVENOUS | Status: DC
  Administered 2020-10-10 – 2020-10-13 (×7): 10 mL

## 2020-10-10 MED ORDER — PLATELET POOR PLASMA OPTIME
Status: DC | PRN
  Administered 2020-10-10: 10 mL

## 2020-10-10 MED ORDER — PANTOPRAZOLE SODIUM 40 MG PO TBEC
40.0000 mg | DELAYED_RELEASE_TABLET | Freq: Every day | ORAL | Status: DC
  Administered 2020-10-12 – 2020-10-15 (×4): 40 mg via ORAL
  Filled 2020-10-10 (×4): qty 1

## 2020-10-10 MED ORDER — LACTATED RINGERS IV SOLN: INTRAVENOUS | Status: DC | PRN

## 2020-10-10 MED ORDER — ORAL CARE MOUTH RINSE
15.0000 mL | OROMUCOSAL | Status: DC
  Administered 2020-10-10 – 2020-10-11 (×7): 15 mL via OROMUCOSAL

## 2020-10-10 MED ORDER — BISACODYL 10 MG RE SUPP: 10.0000 mg | Freq: Every day | RECTAL | Status: DC

## 2020-10-10 MED ORDER — SODIUM CHLORIDE 0.9% FLUSH
3.0000 mL | Freq: Two times a day (BID) | INTRAVENOUS | Status: DC
  Administered 2020-10-11 – 2020-10-14 (×6): 3 mL via INTRAVENOUS

## 2020-10-10 MED ORDER — BUPIVACAINE LIPOSOME 1.3 % IJ SUSP
INTRAMUSCULAR | Status: DC | PRN
  Administered 2020-10-10: 50 mL

## 2020-10-10 MED ORDER — VECURONIUM BROMIDE 10 MG IV SOLR
INTRAVENOUS | Status: AC
  Filled 2020-10-10: qty 10

## 2020-10-10 MED ORDER — HEMOSTATIC AGENTS (NO CHARGE) OPTIME
TOPICAL | Status: DC | PRN
  Administered 2020-10-10: 1 via TOPICAL

## 2020-10-10 MED ORDER — KETOROLAC TROMETHAMINE 15 MG/ML IJ SOLN
7.5000 mg | Freq: Four times a day (QID) | INTRAMUSCULAR | Status: AC
  Administered 2020-10-10 – 2020-10-11 (×4): 7.5 mg via INTRAVENOUS
  Filled 2020-10-10 (×4): qty 1

## 2020-10-10 MED ORDER — NITROGLYCERIN IN D5W 200-5 MCG/ML-% IV SOLN
0.0000 ug/min | INTRAVENOUS | Status: DC
  Administered 2020-10-11: 60 ug/min via INTRAVENOUS
  Administered 2020-10-12: 30 ug/min via INTRAVENOUS
  Filled 2020-10-10 (×2): qty 250

## 2020-10-10 MED ORDER — METOPROLOL TARTRATE 5 MG/5ML IV SOLN
2.5000 mg | INTRAVENOUS | Status: DC | PRN
  Administered 2020-10-10 – 2020-10-12 (×2): 5 mg via INTRAVENOUS
  Filled 2020-10-10 (×2): qty 5

## 2020-10-10 MED ORDER — CHLORHEXIDINE GLUCONATE 0.12 % MT SOLN
15.0000 mL | OROMUCOSAL | Status: AC
  Administered 2020-10-10: 15 mL via OROMUCOSAL

## 2020-10-10 MED ORDER — BUPIVACAINE LIPOSOME 1.3 % IJ SUSP
INTRAMUSCULAR | Status: AC
  Filled 2020-10-10: qty 20

## 2020-10-10 MED ORDER — ONDANSETRON HCL 4 MG/2ML IJ SOLN
4.0000 mg | Freq: Four times a day (QID) | INTRAMUSCULAR | Status: DC | PRN
  Administered 2020-10-11 – 2020-10-12 (×4): 4 mg via INTRAVENOUS
  Filled 2020-10-10 (×4): qty 2

## 2020-10-10 MED ORDER — LACTATED RINGERS IV SOLN: INTRAVENOUS | Status: DC

## 2020-10-10 MED ORDER — PROPOFOL 10 MG/ML IV BOLUS
INTRAVENOUS | Status: AC
  Filled 2020-10-10: qty 20

## 2020-10-10 MED ORDER — MAGNESIUM SULFATE 4 GM/100ML IV SOLN
4.0000 g | Freq: Once | INTRAVENOUS | Status: AC
Start: 2020-10-10 — End: 2020-10-10
  Administered 2020-10-10: 4 g via INTRAVENOUS
  Filled 2020-10-10: qty 100

## 2020-10-10 MED ORDER — CLINDAMYCIN PHOSPHATE 900 MG/50ML IV SOLN
900.0000 mg | INTRAVENOUS | Status: AC
  Administered 2020-10-10: 900 mg via INTRAVENOUS
  Filled 2020-10-10: qty 50

## 2020-10-10 MED ORDER — BUPIVACAINE HCL (PF) 0.5 % IJ SOLN
INTRAMUSCULAR | Status: AC
  Filled 2020-10-10: qty 30

## 2020-10-10 MED ORDER — FAMOTIDINE IN NACL 20-0.9 MG/50ML-% IV SOLN
20.0000 mg | Freq: Two times a day (BID) | INTRAVENOUS | Status: AC
  Administered 2020-10-10 (×2): 20 mg via INTRAVENOUS
  Filled 2020-10-10 (×2): qty 50

## 2020-10-10 MED ORDER — PROPOFOL 10 MG/ML IV BOLUS
INTRAVENOUS | Status: DC | PRN
  Administered 2020-10-10: 20 mg via INTRAVENOUS
  Administered 2020-10-10: 50 mg via INTRAVENOUS
  Administered 2020-10-10: 100 mg via INTRAVENOUS

## 2020-10-10 MED ORDER — ACETAMINOPHEN 650 MG RE SUPP
650.0000 mg | Freq: Once | RECTAL | Status: AC
  Administered 2020-10-10: 650 mg via RECTAL

## 2020-10-10 MED ORDER — SODIUM CHLORIDE 0.45 % IV SOLN: INTRAVENOUS | Status: DC | PRN

## 2020-10-10 MED ORDER — POTASSIUM CHLORIDE 10 MEQ/50ML IV SOLN
10.0000 meq | INTRAVENOUS | Status: AC
  Administered 2020-10-10 (×3): 10 meq via INTRAVENOUS
  Filled 2020-10-10 (×3): qty 50

## 2020-10-10 MED ORDER — DEXTROSE 50 % IV SOLN: 0.0000 mL | INTRAVENOUS | Status: DC | PRN

## 2020-10-10 MED ORDER — METOPROLOL TARTRATE 12.5 MG HALF TABLET
12.5000 mg | ORAL_TABLET | Freq: Two times a day (BID) | ORAL | Status: DC
  Administered 2020-10-10: 12.5 mg via ORAL
  Filled 2020-10-10: qty 1

## 2020-10-10 MED ORDER — CHLORHEXIDINE GLUCONATE 0.12% ORAL RINSE (MEDLINE KIT)
15.0000 mL | Freq: Two times a day (BID) | OROMUCOSAL | Status: DC
  Administered 2020-10-10: 15 mL via OROMUCOSAL

## 2020-10-10 MED ORDER — MORPHINE SULFATE (PF) 2 MG/ML IV SOLN
1.0000 mg | INTRAVENOUS | Status: DC | PRN
  Administered 2020-10-10 – 2020-10-14 (×7): 2 mg via INTRAVENOUS
  Filled 2020-10-10 (×7): qty 1

## 2020-10-10 MED ORDER — DOCUSATE SODIUM 100 MG PO CAPS
200.0000 mg | ORAL_CAPSULE | Freq: Every day | ORAL | Status: DC
  Administered 2020-10-11 – 2020-10-13 (×2): 200 mg via ORAL
  Filled 2020-10-10 (×5): qty 2

## 2020-10-10 MED ORDER — HEPARIN SODIUM (PORCINE) 1000 UNIT/ML IJ SOLN
INTRAMUSCULAR | Status: AC
  Filled 2020-10-10: qty 1

## 2020-10-10 MED ORDER — 0.9 % SODIUM CHLORIDE (POUR BTL) OPTIME
TOPICAL | Status: DC | PRN
  Administered 2020-10-10: 5000 mL

## 2020-10-10 MED ORDER — BISACODYL 5 MG PO TBEC
10.0000 mg | DELAYED_RELEASE_TABLET | Freq: Every day | ORAL | Status: DC
  Administered 2020-10-11 – 2020-10-13 (×2): 10 mg via ORAL
  Filled 2020-10-10 (×3): qty 2

## 2020-10-10 MED ORDER — PROTAMINE SULFATE 10 MG/ML IV SOLN
INTRAVENOUS | Status: DC | PRN
  Administered 2020-10-10 (×2): 200 mg via INTRAVENOUS

## 2020-10-10 MED ORDER — SODIUM CHLORIDE 0.9% FLUSH: 10.0000 mL | INTRAVENOUS | Status: DC | PRN

## 2020-10-10 MED ORDER — ALBUMIN HUMAN 5 % IV SOLN
250.0000 mL | INTRAVENOUS | Status: AC | PRN
  Administered 2020-10-10 (×2): 12.5 g via INTRAVENOUS

## 2020-10-10 MED ORDER — LACTATED RINGERS IV SOLN: 500.0000 mL | Freq: Once | INTRAVENOUS | Status: DC | PRN

## 2020-10-10 MED ORDER — OXYCODONE HCL 5 MG PO TABS
5.0000 mg | ORAL_TABLET | ORAL | Status: DC | PRN
  Administered 2020-10-11 – 2020-10-12 (×6): 10 mg via ORAL
  Administered 2020-10-12: 5 mg via ORAL
  Administered 2020-10-12: 10 mg via ORAL
  Administered 2020-10-13 (×2): 5 mg via ORAL
  Administered 2020-10-14 – 2020-10-15 (×4): 10 mg via ORAL
  Filled 2020-10-10 (×4): qty 2
  Filled 2020-10-10 (×2): qty 1
  Filled 2020-10-10 (×2): qty 2
  Filled 2020-10-10: qty 1
  Filled 2020-10-10 (×5): qty 2

## 2020-10-10 MED ORDER — PHENYLEPHRINE HCL-NACL 20-0.9 MG/250ML-% IV SOLN: 0.0000 ug/min | INTRAVENOUS | Status: DC

## 2020-10-10 MED ORDER — ACETAMINOPHEN 160 MG/5ML PO SOLN: 1000.0000 mg | Freq: Four times a day (QID) | ORAL | Status: DC

## 2020-10-10 MED ORDER — SUFENTANIL CITRATE 250 MCG/5ML IV SOLN
INTRAVENOUS | Status: AC
  Filled 2020-10-10: qty 5

## 2020-10-10 MED ORDER — VECURONIUM BROMIDE 10 MG IV SOLR
INTRAVENOUS | Status: DC | PRN
  Administered 2020-10-10 (×4): 5 mg via INTRAVENOUS

## 2020-10-10 MED ORDER — THROMBIN 5000 UNITS EX SOLR
INTRAVENOUS | Status: DC | PRN
  Administered 2020-10-10: 2 mL

## 2020-10-10 MED ORDER — CEFAZOLIN SODIUM-DEXTROSE 2-4 GM/100ML-% IV SOLN
2.0000 g | Freq: Three times a day (TID) | INTRAVENOUS | Status: AC
  Administered 2020-10-10 – 2020-10-12 (×6): 2 g via INTRAVENOUS
  Filled 2020-10-10 (×7): qty 100

## 2020-10-10 MED ORDER — SUFENTANIL CITRATE 50 MCG/ML IV SOLN
INTRAVENOUS | Status: DC | PRN
  Administered 2020-10-10: 10 ug via INTRAVENOUS
  Administered 2020-10-10: 50 ug via INTRAVENOUS
  Administered 2020-10-10: 40 ug via INTRAVENOUS
  Administered 2020-10-10 (×2): 50 ug via INTRAVENOUS

## 2020-10-10 MED ORDER — SODIUM CHLORIDE 0.9% FLUSH: 3.0000 mL | INTRAVENOUS | Status: DC | PRN

## 2020-10-10 MED ORDER — VANCOMYCIN HCL 1000 MG IV SOLR
INTRAVENOUS | Status: AC
  Filled 2020-10-10: qty 3000

## 2020-10-10 MED ORDER — STERILE WATER FOR INJECTION IJ SOLN
INTRAMUSCULAR | Status: AC
  Filled 2020-10-10: qty 10

## 2020-10-10 MED ORDER — ACETAMINOPHEN 160 MG/5ML PO SOLN: 650.0000 mg | Freq: Once | ORAL | Status: AC

## 2020-10-10 MED ORDER — INSULIN REGULAR(HUMAN) IN NACL 100-0.9 UT/100ML-% IV SOLN
INTRAVENOUS | Status: DC
  Filled 2020-10-10: qty 100

## 2020-10-10 MED ORDER — DEXMEDETOMIDINE HCL IN NACL 400 MCG/100ML IV SOLN
0.0000 ug/kg/h | INTRAVENOUS | Status: DC
  Administered 2020-10-10: 0.5 ug/kg/h via INTRAVENOUS
  Filled 2020-10-10: qty 100

## 2020-10-10 MED ORDER — METOPROLOL TARTRATE 25 MG/10 ML ORAL SUSPENSION: 12.5000 mg | Freq: Two times a day (BID) | ORAL | Status: DC

## 2020-10-10 MED ORDER — SODIUM CHLORIDE 0.9 % IV SOLN: 250.0000 mL | INTRAVENOUS | Status: DC

## 2020-10-10 MED ORDER — MIDAZOLAM HCL (PF) 10 MG/2ML IJ SOLN
INTRAMUSCULAR | Status: AC
  Filled 2020-10-10: qty 2

## 2020-10-10 MED ORDER — VANCOMYCIN HCL 1000 MG IV SOLR
INTRAVENOUS | Status: DC | PRN
  Administered 2020-10-10: 3 g via TOPICAL

## 2020-10-10 MED ORDER — TRAMADOL HCL 50 MG PO TABS
50.0000 mg | ORAL_TABLET | ORAL | Status: DC | PRN
  Administered 2020-10-10 – 2020-10-12 (×4): 100 mg via ORAL
  Administered 2020-10-13 (×2): 50 mg via ORAL
  Administered 2020-10-13: 100 mg via ORAL
  Filled 2020-10-10: qty 1
  Filled 2020-10-10 (×2): qty 2
  Filled 2020-10-10: qty 1
  Filled 2020-10-10 (×3): qty 2

## 2020-10-10 MED ORDER — ALBUMIN HUMAN 5 % IV SOLN: INTRAVENOUS | Status: DC | PRN

## 2020-10-10 MED ORDER — CHLORHEXIDINE GLUCONATE CLOTH 2 % EX PADS
6.0000 | MEDICATED_PAD | Freq: Every day | CUTANEOUS | Status: DC
  Administered 2020-10-10 – 2020-10-13 (×4): 6 via TOPICAL

## 2020-10-10 MED ORDER — ASPIRIN 81 MG PO CHEW: 324.0000 mg | CHEWABLE_TABLET | Freq: Every day | ORAL | Status: DC

## 2020-10-10 MED ORDER — MIDAZOLAM HCL 5 MG/5ML IJ SOLN
INTRAMUSCULAR | Status: DC | PRN
  Administered 2020-10-10 (×2): 4 mg via INTRAVENOUS
  Administered 2020-10-10: 2 mg via INTRAVENOUS

## 2020-10-10 MED ORDER — PLASMA-LYTE 148 IV SOLN
INTRAVENOUS | Status: DC | PRN
  Administered 2020-10-10: 500 mL

## 2020-10-10 MED ORDER — PLATELET RICH PLASMA OPTIME
Status: DC | PRN
  Administered 2020-10-10: 10 mL

## 2020-10-10 MED ORDER — ASPIRIN EC 325 MG PO TBEC
325.0000 mg | DELAYED_RELEASE_TABLET | Freq: Every day | ORAL | Status: DC
  Administered 2020-10-11 – 2020-10-15 (×5): 325 mg via ORAL
  Filled 2020-10-10 (×5): qty 1

## 2020-10-10 MED ORDER — PLASMA-LYTE A IV SOLN: INTRAVENOUS | Status: DC

## 2020-10-10 MED ORDER — ACETAMINOPHEN 500 MG PO TABS
1000.0000 mg | ORAL_TABLET | Freq: Four times a day (QID) | ORAL | Status: DC
  Administered 2020-10-11 – 2020-10-15 (×17): 1000 mg via ORAL
  Filled 2020-10-10 (×17): qty 2

## 2020-10-10 MED ORDER — POTASSIUM CHLORIDE 10 MEQ/50ML IV SOLN: 10.0000 meq | INTRAVENOUS | Status: AC

## 2020-10-10 MED ORDER — HEPARIN SODIUM (PORCINE) 1000 UNIT/ML IJ SOLN
INTRAMUSCULAR | Status: DC | PRN
  Administered 2020-10-10: 40000 [IU] via INTRAVENOUS

## 2020-10-10 MED ORDER — SODIUM CHLORIDE 0.9 % IV SOLN: INTRAVENOUS | Status: DC

## 2020-10-10 SURGICAL SUPPLY — 86 items
ADAPTER CARDIO PERF ANTE/RETRO (ADAPTER) ×3 IMPLANT
APPLICATOR TIP COSEAL (VASCULAR PRODUCTS) IMPLANT
BAG DECANTER FOR FLEXI CONT (MISCELLANEOUS) ×3 IMPLANT
BLADE CLIPPER SURG (BLADE) ×3 IMPLANT
BLADE STERNUM SYSTEM 6 (BLADE) ×3 IMPLANT
BNDG ELASTIC 4X5.8 VLCR STR LF (GAUZE/BANDAGES/DRESSINGS) ×3 IMPLANT
BNDG ELASTIC 6X15 VLCR STRL LF (GAUZE/BANDAGES/DRESSINGS) ×3 IMPLANT
BNDG ELASTIC 6X5.8 VLCR STR LF (GAUZE/BANDAGES/DRESSINGS) ×3 IMPLANT
BNDG GAUZE ELAST 4 BULKY (GAUZE/BANDAGES/DRESSINGS) ×3 IMPLANT
CABLE PACING FASLOC BLUE (MISCELLANEOUS) ×6 IMPLANT
CANISTER SUCT 3000ML PPV (MISCELLANEOUS) ×3 IMPLANT
CATH CPB KIT HENDRICKSON (MISCELLANEOUS) ×3 IMPLANT
CATH RETROPLEGIA CORONARY 14FR (CATHETERS) ×3 IMPLANT
CATH ROBINSON RED A/P 18FR (CATHETERS) ×9 IMPLANT
CLIP RETRACTION 3.0MM CORONARY (MISCELLANEOUS) ×3 IMPLANT
CLIP VESOCCLUDE SM WIDE 24/CT (CLIP) ×9 IMPLANT
CONTAINER PROTECT SURGISLUSH (MISCELLANEOUS) ×3 IMPLANT
DERMABOND ADVANCED (GAUZE/BANDAGES/DRESSINGS) ×1
DERMABOND ADVANCED .7 DNX12 (GAUZE/BANDAGES/DRESSINGS) ×2 IMPLANT
DRAIN CHANNEL 28F RND 3/8 FF (WOUND CARE) ×9 IMPLANT
DRAPE CARDIOVASCULAR INCISE (DRAPES) ×1
DRAPE SRG 135X102X78XABS (DRAPES) ×2 IMPLANT
DRAPE WARM FLUID 44X44 (DRAPES) ×3 IMPLANT
DRSG AQUACEL AG ADV 3.5X14 (GAUZE/BANDAGES/DRESSINGS) ×3 IMPLANT
ELECT CAUTERY BLADE 6.4 (BLADE) ×3 IMPLANT
ELECT REM PT RETURN 9FT ADLT (ELECTROSURGICAL) ×6
ELECTRODE REM PT RTRN 9FT ADLT (ELECTROSURGICAL) ×4 IMPLANT
FELT TEFLON 1X6 (MISCELLANEOUS) ×6 IMPLANT
GAUZE SPONGE 4X4 12PLY STRL (GAUZE/BANDAGES/DRESSINGS) ×6 IMPLANT
GLOVE NEODERM STRL 7.5 LF PF (GLOVE) ×6 IMPLANT
GLOVE SURG NEODERM 7.5  LF PF (GLOVE) ×3
GOWN STRL REUS W/ TWL LRG LVL3 (GOWN DISPOSABLE) ×8 IMPLANT
GOWN STRL REUS W/TWL LRG LVL3 (GOWN DISPOSABLE) ×4
HEMOSTAT POWDER SURGIFOAM 1G (HEMOSTASIS) ×6 IMPLANT
INSERT FOGARTY XLG (MISCELLANEOUS) ×3 IMPLANT
INSERT SUTURE HOLDER (MISCELLANEOUS) ×3 IMPLANT
KIT BASIN OR (CUSTOM PROCEDURE TRAY) ×3 IMPLANT
KIT SUCTION CATH 14FR (SUCTIONS) ×3 IMPLANT
KIT TURNOVER KIT B (KITS) ×3 IMPLANT
KIT VASOVIEW HEMOPRO 2 VH 4000 (KITS) ×3 IMPLANT
MARKER GRAFT CORONARY BYPASS (MISCELLANEOUS) ×9 IMPLANT
NEEDLE 18GX1X1/2 (RX/OR ONLY) (NEEDLE) ×3 IMPLANT
NS IRRIG 1000ML POUR BTL (IV SOLUTION) ×15 IMPLANT
PACK E OPEN HEART (SUTURE) ×3 IMPLANT
PACK OPEN HEART (CUSTOM PROCEDURE TRAY) ×3 IMPLANT
PACK SPY-PHI (KITS) ×3 IMPLANT
PAD ARMBOARD 7.5X6 YLW CONV (MISCELLANEOUS) ×6 IMPLANT
PAD ELECT DEFIB RADIOL ZOLL (MISCELLANEOUS) ×3 IMPLANT
PENCIL BUTTON HOLSTER BLD 10FT (ELECTRODE) ×3 IMPLANT
POSITIONER HEAD DONUT 9IN (MISCELLANEOUS) ×3 IMPLANT
POWDER SURGICEL 3.0 GRAM (HEMOSTASIS) ×6 IMPLANT
PUNCH AORTIC ROTATE 5MM 8IN (MISCELLANEOUS) ×3 IMPLANT
SEALANT SURG COSEAL 4ML (VASCULAR PRODUCTS) ×3 IMPLANT
SEALANT SURG COSEAL 8ML (VASCULAR PRODUCTS) IMPLANT
SET CARDIOPLEGIA MPS 5001102 (MISCELLANEOUS) ×3 IMPLANT
SUT BONE WAX W31G (SUTURE) ×3 IMPLANT
SUT MNCRL AB 3-0 PS2 18 (SUTURE) ×6 IMPLANT
SUT MNCRL AB 4-0 PS2 18 (SUTURE) ×3 IMPLANT
SUT PDS AB 1 CTX 36 (SUTURE) ×6 IMPLANT
SUT PROLENE 3 0 SH DA (SUTURE) ×6 IMPLANT
SUT PROLENE 5 0 C 1 36 (SUTURE) IMPLANT
SUT PROLENE 6 0 C 1 30 (SUTURE) ×9 IMPLANT
SUT PROLENE 8 0 BV175 6 (SUTURE) IMPLANT
SUT PROLENE BLUE 7 0 (SUTURE) ×3 IMPLANT
SUT SILK 2 0 SH CR/8 (SUTURE) IMPLANT
SUT SILK 3 0 SH CR/8 (SUTURE) IMPLANT
SUT STEEL 6MS V (SUTURE) ×3 IMPLANT
SUT STEEL STERNAL CCS#1 18IN (SUTURE) ×12 IMPLANT
SUT STEEL SZ 6 DBL 3X14 BALL (SUTURE) ×3 IMPLANT
SUT VIC AB 2-0 CTX 27 (SUTURE) IMPLANT
SUT VIC AB 2-0 UR6 27 (SUTURE) ×3 IMPLANT
SUT VIC AB 3-0 X1 27 (SUTURE) IMPLANT
SYR 10ML LL (SYRINGE) IMPLANT
SYR 30ML LL (SYRINGE) ×3 IMPLANT
SYR 3ML LL SCALE MARK (SYRINGE) ×3 IMPLANT
SYSTEM SAHARA CHEST DRAIN ATS (WOUND CARE) ×3 IMPLANT
TAPE CLOTH SURG 4X10 WHT LF (GAUZE/BANDAGES/DRESSINGS) ×3 IMPLANT
TAPE PAPER 2X10 WHT MICROPORE (GAUZE/BANDAGES/DRESSINGS) ×3 IMPLANT
TOWEL GREEN STERILE (TOWEL DISPOSABLE) ×3 IMPLANT
TOWEL GREEN STERILE FF (TOWEL DISPOSABLE) ×3 IMPLANT
TRAY CATH LUMEN 1 20CM STRL (SET/KITS/TRAYS/PACK) ×3 IMPLANT
TRAY FOLEY SLVR 16FR TEMP STAT (SET/KITS/TRAYS/PACK) ×3 IMPLANT
TUBING LAP HI FLOW INSUFFLATIO (TUBING) ×3 IMPLANT
UNDERPAD 30X36 HEAVY ABSORB (UNDERPADS AND DIAPERS) ×3 IMPLANT
WATER STERILE IRR 1000ML POUR (IV SOLUTION) ×6 IMPLANT
WATER STERILE IRR 1000ML UROMA (IV SOLUTION) IMPLANT

## 2020-10-10 NOTE — H&P (Signed)
History and Physical Interval Note:  10/10/2020 7:28 AM  Russell Morrow  has presented today for surgery, with the diagnosis of CAD.  The various methods of treatment have been discussed with the patient and family. After consideration of risks, benefits and other options for treatment, the patient has consented to  Procedure(s): CORONARY ARTERY BYPASS GRAFTING (CABG) (N/A) TRANSESOPHAGEAL ECHOCARDIOGRAM (TEE) (N/A) INDOCYANINE GREEN FLUORESCENCE IMAGING (ICG) (N/A) as a surgical intervention.  The patient's history has been reviewed, patient examined, no change in status, stable for surgery.  I have reviewed the patient's chart and labs.  Questions were answered to the patient's satisfaction.     Russell Morrow

## 2020-10-10 NOTE — Progress Notes (Signed)
Patient is for CABG today. Will sign off.  CT surgery will take over as primary team. Communicated with CT surgery [facility] won't bill for today.

## 2020-10-10 NOTE — Op Note (Signed)
CARDIOTHORACIC SURGERY OPERATIVE NOTE  Date of Procedure: 10/10/2020  Preoperative Diagnosis: Severe 3-vessel Coronary Artery Disease  Postoperative Diagnosis: Same  Procedure:    Coronary Artery Bypass Grafting x 2   Left Internal Mammary Artery to Distal Left Anterior Descending Coronary Arteryl; Saphenous Vein Graft to Obtuse Marginal Branch of Left Circumflex Coronary Artery; Endoscopic Vein Harvest from right Thigh    Surgeon: B. Lorayne Marek, MD  Assistant: Webb Laws, PA-C  Anesthesia:get  Operative Findings:  preserved left ventricular systolic function  good quality left internal mammary artery conduit  good quality saphenous vein conduit  good quality target vessels for grafting    BRIEF CLINICAL NOTE AND INDICATIONS FOR SURGERY  70 year old man with a history of MI status post PCI presented with chest tightness and several weeks history of lower extremity edema.  Due to his past medical history underwent left heart catheterization which demonstrated proximal circumflex disease and a total LAD.  He is referred for CABG.  Has been thoroughly evaluated and is considered to be a good candidate for the procedure     DETAILS OF THE OPERATIVE PROCEDURE  Preparation:  The patient is brought to the operating room on the above mentioned date and central monitoring was established by the anesthesia team including placement of Swan-Ganz catheter and radial arterial line. The patient is placed in the supine position on the operating table.  Intravenous antibiotics are administered. General endotracheal anesthesia is induced uneventfully. A Foley catheter is placed.  Baseline transesophageal echocardiogram was performed.  Findings were notable for preserved LV function and mild mitral regurgitation  The patient's chest, abdomen, both groins, and both lower extremities are prepared and draped in a sterile manner. A time out procedure is performed.   Surgical Approach and  Conduit Harvest:  A median sternotomy incision was performed and the left internal mammary artery is dissected from the chest wall and prepared for bypass grafting. The left internal mammary artery is notably good quality conduit. Simultaneously, the greater saphenous vein is obtained from the patient's right thigh using endoscopic vein harvest technique. The saphenous vein is notably good quality conduit. After removal of the saphenous vein, the small surgical incisions in the lower extremity are closed with absorbable suture. Following systemic heparinization, the left internal mammary artery was transected distally noted to have excellent flow.   Extracorporeal Cardiopulmonary Bypass and Myocardial Protection:  The pericardium is opened. The ascending aorta is mildly dilated in appearance. The ascending aorta and the right atrium are cannulated for cardiopulmonary bypass.  Adequate heparinization is verified.  A retrograde cardioplegia cannula is placed through the right atrium into the coronary sinus.  The entire pre-bypass portion of the operation was notable for stable hemodynamics.  Cardiopulmonary bypass was begun and the surface of the heart is inspected. Distal target vessels are selected for coronary artery bypass grafting. A cardioplegia cannula is placed in the ascending aorta.   The patient is allowed to cool passively to 35C systemic temperature.  The aortic cross clamp is applied and cold blood cardioplegia is delivered initially in an antegrade fashion through the aortic root.  Supplemental cardioplegia is given retrograde through the coronary sinus catheter.  Iced saline slush is applied for topical hypothermia.  The initial cardioplegic arrest is rapid with early diastolic arrest.  Repeat doses of cardioplegia are administered intermittently throughout the entire cross clamp portion of the operation through the aortic root, through the coronary sinus catheter, and through  subsequently placed vein grafts in order to maintain  completely flat electrocardiogram..   Coronary Artery Bypass Grafting:    The first obtuse marginal branch of the left circumflex coronary artery was grafted using a reversed saphenous vein graft in an end-to-side fashion.  At the site of distal anastomosis the target vessel was good quality and measured approximately 1.5 mm in diameter.  The distal left anterior coronary artery was grafted with the left internal mammary artery in an end-to-side fashion.  At the site of distal anastomosis the target vessel was good quality and measured approximately 1.5 mm in diameter.  All proximal vein graft anastomoses were placed directly to the ascending aorta prior to removal of the aortic cross clamp.  A hotshot dose of cardioplegia was given antegrade and retrograde.  De-airing procedures were performed and the aortic cross-clamp was removed.   Procedure Completion:  All proximal and distal coronary anastomoses were inspected for hemostasis and appropriate graft orientation. Epicardial pacing wires are fixed to the right ventricular outflow tract and to the right atrial appendage. The patient is rewarmed to 37C temperature. The patient is weaned and disconnected from cardiopulmonary bypass.  The patient's rhythm at separation from bypass was sinus.  The patient was weaned from cardiopulmonary bypass without any inotropic support.   Followup transesophageal echocardiogram performed after separation from bypass revealed no changes from the preoperative exam.  The aortic and venous cannula were removed uneventfully. Protamine was administered to reverse the anticoagulation. The mediastinum and pleural space were inspected for hemostasis and irrigated with saline solution. The mediastinum and left pleural space were drained using fluted chest tubes placed through separate stab incisions inferiorly.  The soft tissues anterior to the aorta were  reapproximated loosely. The sternum is closed with double strength sternal wire. The soft tissues anterior to the sternum were closed in multiple layers and the skin is closed with a running subcuticular skin closure.  The post-bypass portion of the operation was notable for stable rhythm and hemodynamics.  No blood products were administered during the operation.   Disposition:  The patient tolerated the procedure well and is transported to the surgical intensive care in stable condition. There are no intraoperative complications. All sponge instrument and needle counts are verified correct at completion of the operation.    Brantley Fling, MD 10/10/2020 1:13 PM

## 2020-10-10 NOTE — Anesthesia Procedure Notes (Signed)
Central Venous Catheter Insertion Performed by: Atilano Median, DO, anesthesiologist Start/End5/09/2020 8:04 AM, 10/10/2020 8:05 AM Patient location: Pre-op. Preanesthetic checklist: patient identified, IV checked, site marked, risks and benefits discussed, surgical consent, monitors and equipment checked, pre-op evaluation, timeout performed and anesthesia consent Position: supine Hand hygiene performed  and maximum sterile barriers used  PA cath was placed.Swan type:thermodilution PA Cath depth:48 Procedure performed without using ultrasound guided technique. Attempts: 1 Patient tolerated the procedure well with no immediate complications.

## 2020-10-10 NOTE — Transfer of Care (Signed)
Immediate Anesthesia Transfer of Care Note  Patient: Russell Morrow  Procedure(s) Performed: CORONARY ARTERY BYPASS GRAFTING (CABG)X2. LEFT INTERNAL MAMMARY ARTERY. RIGHT ENDOSCOPIC SAPHENOUS VEIN HARVESTING. (N/A Chest) TRANSESOPHAGEAL ECHOCARDIOGRAM (TEE) (N/A ) INDOCYANINE GREEN FLUORESCENCE IMAGING (ICG) (N/A )  Patient Location: ICU  Anesthesia Type:General  Level of Consciousness: unresponsive and Patient remains intubated per anesthesia plan  Airway & Oxygen Therapy: Patient remains intubated per anesthesia plan and Patient placed on Ventilator (see vital sign flow sheet for setting)  Post-op Assessment: Report given to RN, Post -op Vital signs reviewed and stable and Patient moving all extremities  Post vital signs: Reviewed and stable  Last Vitals:  Vitals Value Taken Time  BP    Temp    Pulse 79 10/10/20 1309  Resp 16 10/10/20 1309  SpO2 93 % 10/10/20 1309  Vitals shown include unvalidated device data.  Last Pain:  Vitals:   10/10/20 0517  TempSrc: Oral  PainSc:          Complications: No complications documented.

## 2020-10-10 NOTE — Procedures (Signed)
Extubation Procedure Note  Patient Details:   Name: JOEPH SZATKOWSKI DOB: 09/23/1950 MRN: [id number]   Airway Documentation:    Vent end date: 10/10/20 Vent end time: 2118   Evaluation  O2 sats: stable throughout Complications: No apparent complications Patient did tolerate procedure well. Bilateral Breath Sounds: Clear,Diminished   Yes   Patient performed VC 1.2 L and NIF -30; no cuff leak but extubated with anesthesia at bedside per MD Dorris Fetch.  Patient extubated to 4 L [place]; performed IS of 1000 mL.  Epifanio Lesches A 10/10/2020, 9:23 PM

## 2020-10-10 NOTE — Anesthesia Procedure Notes (Signed)
Procedure Name: Intubation Date/Time: 10/10/2020 8:46 AM Performed by: Moshe Salisbury, CRNA Pre-anesthesia Checklist: Patient identified, Emergency Drugs available, Suction available and Patient being monitored Patient Re-evaluated:Patient Re-evaluated prior to induction Oxygen Delivery Method: Circle System Utilized Preoxygenation: Pre-oxygenation with 100% oxygen Induction Type: IV induction Ventilation: Mask ventilation with difficulty, Two handed mask ventilation required and Oral airway inserted - appropriate to patient size Laryngoscope Size: Mac and 4 Grade View: Grade III Tube type: Oral Tube size: 8.0 mm Number of attempts: 1 Airway Equipment and Method: Stylet Placement Confirmation: ETT inserted through vocal cords under direct vision,  positive ETCO2 and breath sounds checked- equal and bilateral Secured at: 22 cm Tube secured with: Tape Dental Injury: Teeth and Oropharynx as per pre-operative assessment

## 2020-10-10 NOTE — Progress Notes (Signed)
  Echocardiogram Echocardiogram Transesophageal has been performed.  Gerda Diss 10/10/2020, 10:00 AM

## 2020-10-10 NOTE — Progress Notes (Signed)
      [address]       [place] [address]             [phone]      POD # 2 CABG x 2  Intubated  BP 103/77   Pulse 92   Temp 99.32 F (37.4 C)   Resp 16   Ht 6\' 2"  (1.88 m)   Wt 113.5 kg   SpO2 99%   BMI 32.12 kg/m   24/15 CI= 1.5 (was 2.2 1 hour before)  Intake/Output Summary (Last 24 hours) at 10/10/2020 1817 Last data filed at 10/10/2020 1800 Gross per 24 hour  Intake 3736.74 ml  Output 2900 ml  Net 836.74 ml   CBG well controlled CT 330 ml since OR  Monitor Co, CT output  Richards Pherigo C. 12/10/2020, MD [facility] [phone]

## 2020-10-10 NOTE — Anesthesia Postprocedure Evaluation (Signed)
Anesthesia Post Note  Patient: Russell Morrow  Procedure(s) Performed: CORONARY ARTERY BYPASS GRAFTING (CABG)X2. LEFT INTERNAL MAMMARY ARTERY. RIGHT ENDOSCOPIC SAPHENOUS VEIN HARVESTING. (N/A Chest) TRANSESOPHAGEAL ECHOCARDIOGRAM (TEE) (N/A ) INDOCYANINE GREEN FLUORESCENCE IMAGING (ICG) (N/A )     Patient location during evaluation: SICU Anesthesia Type: General Level of consciousness: sedated Pain management: pain level controlled Vital Signs Assessment: post-procedure vital signs reviewed and stable Respiratory status: patient remains intubated per anesthesia plan Cardiovascular status: stable Postop Assessment: no apparent nausea or vomiting Anesthetic complications: no   No complications documented.  Last Vitals:  Vitals:   10/10/20 1500 10/10/20 1505  BP: 105/80   Pulse: 92 92  Resp: 18 20  Temp: 37 C 37 C  SpO2: 100% 100%    Last Pain:  Vitals:   10/10/20 1400  TempSrc: Core  PainSc:                  Nelle Don Timera Windt

## 2020-10-10 NOTE — Anesthesia Procedure Notes (Signed)
Procedure Name: Intubation Date/Time: 10/10/2020 9:01 AM Performed by: Moshe Salisbury, CRNA Pre-anesthesia Checklist: Patient identified, Emergency Drugs available, Suction available and Patient being monitored Patient Re-evaluated:Patient Re-evaluated prior to induction Oxygen Delivery Method: Circle System Utilized Preoxygenation: Pre-oxygenation with 100% oxygen Induction Type: IV induction Ventilation: Mask ventilation with difficulty, Two handed mask ventilation required and Oral airway inserted - appropriate to patient size Laryngoscope Size: Mac and 4 Grade View: Grade III Tube type: Oral Tube size: 7.5 mm Number of attempts: 1 Airway Equipment and Method: Stylet and Oral airway Placement Confirmation: ETT inserted through vocal cords under direct vision,  positive ETCO2 and breath sounds checked- equal and bilateral Secured at: 24 cm Tube secured with: Tape Dental Injury: Teeth and Oropharynx as per pre-operative assessment

## 2020-10-10 NOTE — Brief Op Note (Signed)
10/08/2020 - 10/10/2020  11:40 AM  PATIENT:  Russell Morrow  70 y.o. male  PRE-OPERATIVE DIAGNOSIS:  CAD  POST-OPERATIVE DIAGNOSIS:  CAD  PROCEDURE:  Procedure(s): CORONARY ARTERY BYPASS GRAFTING (CABG)X2. LEFT INTERNAL MAMMARY ARTERY. RIGHT ENDOSCOPIC SAPHENOUS VEIN HARVESTING. (N/A) TRANSESOPHAGEAL ECHOCARDIOGRAM (TEE) (N/A) INDOCYANINE GREEN FLUORESCENCE IMAGING (ICG) (N/A) LIMA-LAD SVG-OM  EVH 30 MIN ' SURGEON:  Surgeon(s) and Role:    * [name], MD - Primary  PHYSICIAN ASSISTANT: WAYNE GOLD PA-C  ASSISTANTS: STAFF   ANESTHESIA:   general   BLOOD ADMINISTERED:none  DRAINS: (2 ) Blake drain(s) in the MEDIASTINUM AND LEFT PLEURA   LOCAL MEDICATIONS USED:  OTHER EXPAREL  SPECIMEN:  No Specimen  DISPOSITION OF SPECIMEN:  N/A  COUNTS:  YES  TOURNIQUET:  * No tourniquets in log *  DICTATION: .Dragon Dictation  PLAN OF CARE: ADMIT TO INPATIENT  PATIENT DISPOSITION:  ICU - intubated and hemodynamically stable.   Delay start of Pharmacological VTE agent (>24hrs) due to surgical blood loss or risk of bleeding: yes  COMPLICATIONS: NO KNOWN

## 2020-10-10 NOTE — Anesthesia Procedure Notes (Signed)
Central Venous Catheter Insertion Performed by: Atilano Median, DO, anesthesiologist Start/End5/09/2020 7:45 AM, 10/10/2020 8:03 AM Patient location: Pre-op. Preanesthetic checklist: patient identified, IV checked, site marked, risks and benefits discussed, surgical consent, monitors and equipment checked, pre-op evaluation, timeout performed and anesthesia consent Position: Trendelenburg Lidocaine 1% used for infiltration and patient sedated Hand hygiene performed  and maximum sterile barriers used  Catheter size: 8.5 Fr Sheath introducer Procedure performed using ultrasound guided technique. Ultrasound Notes:anatomy identified, needle tip was noted to be adjacent to the nerve/plexus identified, no ultrasound evidence of intravascular and/or intraneural injection and image(s) printed for medical record Attempts: 2 (initial attempt unable to thread catheter over wire) Following insertion, line sutured, dressing applied and Biopatch. Post procedure assessment: blood return through all ports, free fluid flow and no air  Patient tolerated the procedure well with no immediate complications.

## 2020-10-10 NOTE — Anesthesia Procedure Notes (Signed)
Arterial Line Insertion Start/End5/09/2020 7:40 AM, 10/10/2020 7:55 AM Performed by: Shireen Quan, CRNA, CRNA  Patient location: Pre-op. Preanesthetic checklist: patient identified, IV checked, site marked, risks and benefits discussed, surgical consent, monitors and equipment checked, pre-op evaluation, timeout performed and anesthesia consent Lidocaine 1% used for infiltration and patient sedated Left, radial was placed Catheter size: 20 G Hand hygiene performed , maximum sterile barriers used  and Seldinger technique used Allen's test indicative of satisfactory collateral circulation Attempts: 1 Procedure performed without using ultrasound guided technique. Following insertion, dressing applied and Biopatch. Post procedure assessment: normal  Patient tolerated the procedure well with no immediate complications.

## 2020-10-10 NOTE — Progress Notes (Signed)
Called to standby during extubation because patient was difficult to intubate during induction of anesthetic. Extubated smoothly, good respiratory effort, maintaining sat, hemodynamics stable.  No intervention needed.

## 2020-10-11 ENCOUNTER — Inpatient Hospital Stay (HOSPITAL_COMMUNITY): Payer: Medicare Other

## 2020-10-11 ENCOUNTER — Encounter (HOSPITAL_COMMUNITY): Payer: Self-pay | Admitting: Cardiothoracic Surgery

## 2020-10-11 LAB — POCT I-STAT 7, (LYTES, BLD GAS, ICA,H+H)
Acid-Base Excess: 1 mmol/L (ref 0.0–2.0)
Acid-base deficit: 2 mmol/L (ref 0.0–2.0)
Acid-base deficit: 1 mmol/L (ref 0.0–2.0)
Bicarbonate: 24.8 mmol/L (ref 20.0–28.0)
Bicarbonate: 25.9 mmol/L (ref 20.0–28.0)
Bicarbonate: 23.5 mmol/L (ref 20.0–28.0)
Calcium, Ion: 1.1 mmol/L — ABNORMAL LOW (ref 1.15–1.40)
Calcium, Ion: 1.18 mmol/L (ref 1.15–1.40)
Calcium, Ion: 1.07 mmol/L — ABNORMAL LOW (ref 1.15–1.40)
HCT: 38 % — ABNORMAL LOW (ref 39.0–52.0)
HCT: 36 % — ABNORMAL LOW (ref 39.0–52.0)
HCT: 37 % — ABNORMAL LOW (ref 39.0–52.0)
Hemoglobin: 12.9 g/dL — ABNORMAL LOW (ref 13.0–17.0)
Hemoglobin: 12.2 g/dL — ABNORMAL LOW (ref 13.0–17.0)
Hemoglobin: 12.6 g/dL — ABNORMAL LOW (ref 13.0–17.0)
O2 Saturation: 89 %
O2 Saturation: 99 %
O2 Saturation: 95 %
Patient temperature: 36.3
Patient temperature: 37.3
Patient temperature: 37.3
Potassium: 4.4 mmol/L (ref 3.5–5.1)
Potassium: 3.9 mmol/L (ref 3.5–5.1)
Potassium: 4 mmol/L (ref 3.5–5.1)
Sodium: 139 mmol/L (ref 135–145)
Sodium: 136 mmol/L (ref 135–145)
Sodium: 138 mmol/L (ref 135–145)
TCO2: 26 mmol/L (ref 22–32)
TCO2: 27 mmol/L (ref 22–32)
TCO2: 25 mmol/L (ref 22–32)
pCO2 arterial: 46.7 mmHg (ref 32.0–48.0)
pCO2 arterial: 41.9 mmHg (ref 32.0–48.0)
pCO2 arterial: 38.5 mmHg (ref 32.0–48.0)
pH, Arterial: 7.329 — ABNORMAL LOW (ref 7.350–7.450)
pH, Arterial: 7.4 (ref 7.350–7.450)
pH, Arterial: 7.394 (ref 7.350–7.450)
pO2, Arterial: 60 mmHg — ABNORMAL LOW (ref 83.0–108.0)
pO2, Arterial: 117 mmHg — ABNORMAL HIGH (ref 83.0–108.0)
pO2, Arterial: 77 mmHg — ABNORMAL LOW (ref 83.0–108.0)

## 2020-10-11 LAB — GLUCOSE, CAPILLARY
Glucose-Capillary: 103 mg/dL — ABNORMAL HIGH (ref 70–99)
Glucose-Capillary: 103 mg/dL — ABNORMAL HIGH (ref 70–99)
Glucose-Capillary: 105 mg/dL — ABNORMAL HIGH (ref 70–99)
Glucose-Capillary: 108 mg/dL — ABNORMAL HIGH (ref 70–99)
Glucose-Capillary: 110 mg/dL — ABNORMAL HIGH (ref 70–99)
Glucose-Capillary: 110 mg/dL — ABNORMAL HIGH (ref 70–99)
Glucose-Capillary: 114 mg/dL — ABNORMAL HIGH (ref 70–99)
Glucose-Capillary: 124 mg/dL — ABNORMAL HIGH (ref 70–99)
Glucose-Capillary: 124 mg/dL — ABNORMAL HIGH (ref 70–99)
Glucose-Capillary: 128 mg/dL — ABNORMAL HIGH (ref 70–99)
Glucose-Capillary: 135 mg/dL — ABNORMAL HIGH (ref 70–99)
Glucose-Capillary: 139 mg/dL — ABNORMAL HIGH (ref 70–99)
Glucose-Capillary: 157 mg/dL — ABNORMAL HIGH (ref 70–99)
Glucose-Capillary: 167 mg/dL — ABNORMAL HIGH (ref 70–99)
Glucose-Capillary: 99 mg/dL (ref 70–99)

## 2020-10-11 LAB — BASIC METABOLIC PANEL
Anion gap: 6 (ref 5–15)
Anion gap: 6 (ref 5–15)
BUN: 17 mg/dL (ref 8–23)
BUN: 18 mg/dL (ref 8–23)
CO2: 24 mmol/L (ref 22–32)
CO2: 24 mmol/L (ref 22–32)
Calcium: 7.4 mg/dL — ABNORMAL LOW (ref 8.9–10.3)
Calcium: 7.4 mg/dL — ABNORMAL LOW (ref 8.9–10.3)
Chloride: 103 mmol/L (ref 98–111)
Chloride: 103 mmol/L (ref 98–111)
Creatinine, Ser: 0.83 mg/dL (ref 0.61–1.24)
Creatinine, Ser: 0.87 mg/dL (ref 0.61–1.24)
GFR, Estimated: >60 mL/min (ref >60)
GFR, Estimated: >60 mL/min (ref >60)
Glucose, Bld: 101 mg/dL — ABNORMAL HIGH (ref 70–99)
Glucose, Bld: 156 mg/dL — ABNORMAL HIGH (ref 70–99)
Potassium: 3.7 mmol/L (ref 3.5–5.1)
Potassium: 4 mmol/L (ref 3.5–5.1)
Sodium: 133 mmol/L — ABNORMAL LOW (ref 135–145)
Sodium: 133 mmol/L — ABNORMAL LOW (ref 135–145)

## 2020-10-11 LAB — CBC
HCT: 35.4 % — ABNORMAL LOW (ref 39.0–52.0)
HCT: 34.9 % — ABNORMAL LOW (ref 39.0–52.0)
Hemoglobin: 12.4 g/dL — ABNORMAL LOW (ref 13.0–17.0)
Hemoglobin: 12.1 g/dL — ABNORMAL LOW (ref 13.0–17.0)
MCH: 32.6 pg (ref 26.0–34.0)
MCH: 32.9 pg (ref 26.0–34.0)
MCHC: 35 g/dL (ref 30.0–36.0)
MCHC: 34.7 g/dL (ref 30.0–36.0)
MCV: 93.2 fL (ref 80.0–100.0)
MCV: 94.8 fL (ref 80.0–100.0)
Platelets: 132 10*3/uL — ABNORMAL LOW (ref 150–400)
Platelets: 129 10*3/uL — ABNORMAL LOW (ref 150–400)
RBC: 3.8 MIL/uL — ABNORMAL LOW (ref 4.22–5.81)
RBC: 3.68 MIL/uL — ABNORMAL LOW (ref 4.22–5.81)
RDW: 13.2 % (ref 11.5–15.5)
RDW: 13.4 % (ref 11.5–15.5)
WBC: 10.5 10*3/uL (ref 4.0–10.5)
WBC: 11 10*3/uL — ABNORMAL HIGH (ref 4.0–10.5)
nRBC: 0 % (ref 0.0–0.2)
nRBC: 0 % (ref 0.0–0.2)

## 2020-10-11 LAB — MAGNESIUM
Magnesium: 2.6 mg/dL — ABNORMAL HIGH (ref 1.7–2.4)
Magnesium: 2.5 mg/dL — ABNORMAL HIGH (ref 1.7–2.4)

## 2020-10-11 MED ORDER — POTASSIUM CHLORIDE CRYS ER 20 MEQ PO TBCR
20.0000 meq | EXTENDED_RELEASE_TABLET | ORAL | Status: AC
  Administered 2020-10-11 (×3): 20 meq via ORAL
  Filled 2020-10-11 (×3): qty 1

## 2020-10-11 MED ORDER — ENOXAPARIN SODIUM 40 MG/0.4ML IJ SOSY
40.0000 mg | PREFILLED_SYRINGE | Freq: Every day | INTRAMUSCULAR | Status: DC
  Administered 2020-10-11 – 2020-10-14 (×4): 40 mg via SUBCUTANEOUS
  Filled 2020-10-11 (×4): qty 0.4

## 2020-10-11 MED ORDER — ORAL CARE MOUTH RINSE
15.0000 mL | Freq: Two times a day (BID) | OROMUCOSAL | Status: DC
  Administered 2020-10-11 – 2020-10-12 (×3): 15 mL via OROMUCOSAL

## 2020-10-11 MED ORDER — METOPROLOL TARTRATE 50 MG PO TABS
50.0000 mg | ORAL_TABLET | Freq: Two times a day (BID) | ORAL | Status: DC
  Administered 2020-10-11 – 2020-10-15 (×8): 50 mg via ORAL
  Filled 2020-10-11 (×8): qty 1

## 2020-10-11 MED ORDER — METOPROLOL TARTRATE 25 MG PO TABS
25.0000 mg | ORAL_TABLET | Freq: Two times a day (BID) | ORAL | Status: DC
  Administered 2020-10-11: 25 mg via ORAL
  Filled 2020-10-11: qty 1

## 2020-10-11 MED ORDER — FUROSEMIDE 10 MG/ML IJ SOLN
40.0000 mg | Freq: Once | INTRAMUSCULAR | Status: AC
  Administered 2020-10-11: 40 mg via INTRAVENOUS
  Filled 2020-10-11: qty 4

## 2020-10-11 MED ORDER — INSULIN ASPART 100 UNIT/ML IJ SOLN
0.0000 [IU] | INTRAMUSCULAR | Status: DC
  Administered 2020-10-11 (×2): 2 [IU] via SUBCUTANEOUS
  Administered 2020-10-11 – 2020-10-12 (×6): 4 [IU] via SUBCUTANEOUS
  Administered 2020-10-12: 12 [IU] via SUBCUTANEOUS
  Administered 2020-10-13: 4 [IU] via SUBCUTANEOUS
  Administered 2020-10-13: 2 [IU] via SUBCUTANEOUS
  Administered 2020-10-13: 4 [IU] via SUBCUTANEOUS

## 2020-10-11 NOTE — Progress Notes (Signed)
EVENING ROUNDS NOTE :     [address]       [place] [address]             [phone]                 1 Day Post-Op Procedure(s) (LRB): CORONARY ARTERY BYPASS GRAFTING (CABG)X2. LEFT INTERNAL MAMMARY ARTERY. RIGHT ENDOSCOPIC SAPHENOUS VEIN HARVESTING. (N/A) TRANSESOPHAGEAL ECHOCARDIOGRAM (TEE) (N/A) INDOCYANINE GREEN FLUORESCENCE IMAGING (ICG) (N/A)   Total Length of Stay:  LOS: 2 days  Events:   No events Will diurese some Will increase BB    BP (!) 160/85   Pulse 87   Temp 98.4 F (36.9 C) (Oral)   Resp 19   Ht 6\' 2"  (1.88 m)   Wt 121.3 kg   SpO2 98%   BMI 34.33 kg/m   PAP: (13-187)/(0-179) 187/179 CO:  [3.6 L/min-8.6 L/min] 7.5 L/min CI:  [1.5 L/min/m2-3.6 L/min/m2] 3.1 L/min/m2  Vent Mode: PSV;CPAP FiO2 (%):  [40 %] 40 % Set Rate:  [4 bmp] 4 bmp Vt Set:  [650 mL] 650 mL PEEP:  [5 cmH20] 5 cmH20 Pressure Support:  [5 cmH20-10 cmH20] 5 cmH20 Plateau Pressure:  [18 cmH20] 18 cmH20  . sodium chloride Stopped (10/11/20 1114)  . sodium chloride    . sodium chloride 10 mL/hr at 10/11/20 1700  .  ceFAZolin (ANCEF) IV Stopped (10/11/20 1442)  . electrolyte-A 10 mL/hr at 10/11/20 1700  . insulin Stopped (10/11/20 1318)  . lactated ringers    . lactated ringers    . lactated ringers Stopped (10/11/20 1436)  . nitroGLYCERIN 40 mcg/min (10/11/20 1700)    I/O last 3 completed shifts: In: 5910.1 [I.V.:4571.1; Blood:200; IV Piggyback:1139.1] Out: 3765 [Urine:2525; Emesis/NG output:100; Blood:400; Chest Tube:740]   CBC Latest Ref Rng & Units 10/11/2020 10/10/2020 10/10/2020  WBC 4.0 - 10.5 K/uL 10.5 - -  Hemoglobin 13.0 - 17.0 g/dL 12.4(L) 12.6(L) 12.2(L)  Hematocrit 39.0 - 52.0 % 35.4(L) 37.0(L) 36.0(L)  Platelets 150 - 400 K/uL 132(L) - -    BMP Latest Ref Rng & Units 10/11/2020 10/10/2020 10/10/2020  Glucose 70 - 99 mg/dL 12/10/2020) - -  BUN 8 - 23 mg/dL 17 - -  Creatinine 914(N - 1.24 mg/dL 8.29 - -  Sodium 5.62 - 145 mmol/L 133(L) 138 136  Potassium  3.5 - 5.1 mmol/L 3.7 4.0 3.9  Chloride 98 - 111 mmol/L 103 - -  CO2 22 - 32 mmol/L 24 - -  Calcium 8.9 - 10.3 mg/dL 7.4(L) - -    ABG    Component Value Date/Time   PHART 7.394 10/10/2020 2238   PCO2ART 38.5 10/10/2020 2238   PO2ART 77 (L) 10/10/2020 2238   HCO3 23.5 10/10/2020 2238   TCO2 25 10/10/2020 2238   ACIDBASEDEF 1.0 10/10/2020 2238   O2SAT 95.0 10/10/2020 2238       12/10/2020, MD 10/11/2020 5:11 PM

## 2020-10-11 NOTE — Progress Notes (Addendum)
[address]       ,[place] [address]             [phone]      1 Day Post-Op Procedure(s) (LRB): CORONARY ARTERY BYPASS GRAFTING (CABG)X2. LEFT INTERNAL MAMMARY ARTERY. RIGHT ENDOSCOPIC SAPHENOUS VEIN HARVESTING. (N/A) TRANSESOPHAGEAL ECHOCARDIOGRAM (TEE) (N/A) INDOCYANINE GREEN FLUORESCENCE IMAGING (ICG) (N/A) Subjective:  Feels pretty well  Objective: Vital signs in last 24 hours: Temp:  [97.34 F (36.3 C)-99.5 F (37.5 C)] 98.42 F (36.9 C) (05/05 0800) Pulse Rate:  [82-93] 85 (05/05 0800) Cardiac Rhythm: Normal sinus rhythm (05/05 0800) Resp:  [11-39] 25 (05/05 0800) BP: (99-149)/(63-94) 124/83 (05/05 0800) SpO2:  [93 %-100 %] 97 % (05/05 0800) Arterial Line BP: (93-205)/(50-82) 155/60 (05/05 0800) FiO2 (%):  [40 %-50 %] 40 % (05/04 2000) Weight:  [121.3 kg] 121.3 kg (05/05 0500)  Hemodynamic parameters for last 24 hours: PAP: (13-143)/(0-56) 24/0 CO:  [3.6 L/min-8.6 L/min] 6.7 L/min CI:  [1.5 L/min/m2-3.6 L/min/m2] 2.8 L/min/m2  Intake/Output from previous day: 05/04 0701 - 05/05 0700 In: 5797.6 [I.V.:4458.6; Blood:200; IV Piggyback:1139.1] Out: 2865 [Urine:1625; Emesis/NG output:100; Blood:400; Chest Tube:740] Intake/Output this shift: Total I/O In: 136.3 [I.V.:136.3] Out: 265 [Urine:115; Chest Tube:150]  General appearance: alert, cooperative and no distress Heart: regular rate and rhythm Lungs: mildly dim in bases Abdomen: benign Extremities: minor edema Wound: incis dressing CDI  Lab Results: Recent Labs    10/10/20 1906 10/10/20 2035 10/10/20 2238 10/11/20 0412  WBC 10.2  --   --  10.5  HGB 13.4   < > 12.6* 12.4*  HCT 38.3*   < > 37.0* 35.4*  PLT 136*  --   --  132*   < > = values in this interval not displayed.   BMET:  Recent Labs    10/10/20 1906 10/10/20 2035 10/10/20 2238 10/11/20 0412  NA 135   < > 138 133*  K 3.7   < > 4.0 3.7  CL 105  --   --  103  CO2 24  --   --  24  GLUCOSE 116*  --   --  101*   BUN 16  --   --  17  CREATININE 0.78  --   --  0.83  CALCIUM 7.3*  --   --  7.4*   < > = values in this interval not displayed.    PT/INR:  Recent Labs    10/10/20 1330  LABPROT 16.1*  INR 1.3*   ABG    Component Value Date/Time   PHART 7.394 10/10/2020 2238   HCO3 23.5 10/10/2020 2238   TCO2 25 10/10/2020 2238   ACIDBASEDEF 1.0 10/10/2020 2238   O2SAT 95.0 10/10/2020 2238   CBG (last 3)  Recent Labs    10/11/20 0505 10/11/20 0607 10/11/20 0758  GLUCAP 103* 124* 124*    Meds Scheduled Meds: . acetaminophen  1,000 mg Oral Q6H   Or  . acetaminophen (TYLENOL) oral liquid 160 mg/5 mL  1,000 mg Per Tube Q6H  . aspirin EC  325 mg Oral Daily   Or  . aspirin  324 mg Per Tube Daily  . bisacodyl  10 mg Oral Daily   Or  . bisacodyl  10 mg Rectal Daily  . Chlorhexidine Gluconate Cloth  6 each Topical Daily  . docusate sodium  200 mg Oral Daily  . enoxaparin (LOVENOX) injection  40 mg Subcutaneous QHS  . ezetimibe  10 mg Oral Daily  .  insulin aspart  0-24 Units Subcutaneous Q4H  . ketorolac  7.5 mg Intravenous Q6H  . mouth rinse  15 mL Mouth Rinse BID  . metoprolol tartrate  25 mg Oral BID  . [START ON 10/12/2020] pantoprazole  40 mg Oral Daily  . potassium chloride  20 mEq Oral Q4H  . sodium chloride flush  10-40 mL Intracatheter Q12H  . sodium chloride flush  3 mL Intravenous Q12H   Continuous Infusions: . sodium chloride 20 mL/hr at 10/11/20 0800  . sodium chloride    . sodium chloride    . albumin human 12.5 g (10/10/20 1829)  .  ceFAZolin (ANCEF) IV Stopped (10/11/20 0548)  . electrolyte-A 75 mL/hr at 10/11/20 0800  . insulin 2.6 mL/hr at 10/11/20 0800  . lactated ringers    . lactated ringers    . lactated ringers 20 mL/hr at 10/11/20 0800  . nitroGLYCERIN 70 mcg/min (10/11/20 0800)   PRN Meds:.sodium chloride, albumin human, dextrose, lactated ringers, metoprolol tartrate, midazolam, morphine injection, ondansetron (ZOFRAN) IV, oxyCODONE, sodium chloride  flush, sodium chloride flush, traMADol  Xrays CARDIAC CATHETERIZATION  Result Date: 10/09/2020  Ost LAD to Prox LAD lesion is 40% stenosed.  Previously placed Prox Cx to Mid Cx drug eluting stent is widely patent.  RPAV lesion is 30% stenosed.  Prox RCA lesion is 40% stenosed.  Prox LAD to Mid LAD lesion is 100% stenosed.  Prox Cx lesion is 90% stenosed.  The left ventricular systolic function is normal.  LV end diastolic pressure is normal.  The left ventricular ejection fraction is 55-65% by visual estimate.  1. Severe 2 vessel obstructive/occlusive CAD. There is occlusion of the mid LAD immediately after the first diagonal. The LAD fills well by right to left collaterals. There is severe segmental disease in the proximal LCx prior to the previous stent 2. Normal LV function 3. 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   DG Chest Port 1 View  Result Date: 10/11/2020 CLINICAL DATA:  Chest tube.  Open-heart surgery. EXAM: PORTABLE CHEST 1 VIEW COMPARISON:  10/10/2020. FINDINGS: Interim extubation and removal of NG tube. Interim removal of mediastinal drainage catheter. Swan-Ganz catheter and left chest tubes in stable position. No pneumothorax. Prior CABG. Heart size stable. No pulmonary venous congestion. Bibasilar atelectasis. Tiny bilateral pleural effusions cannot be excluded. Prior cervical spine fusion. IMPRESSION: 1. Interim extubation and removal of NG tube. Interim removal of mediastinal drainage catheter. Swan-Ganz catheter left chest tube in stable position. No pneumothorax. 2.  Prior CABG.  Heart size stable. 3.  Mild bibasilar atelectasis.  Tiny bilateral pleural effusions. Electronically Signed   By: Maisie Fus  Register   On: 10/11/2020 06:41   DG Chest Port 1 View  Result Date: 10/10/2020 CLINICAL DATA:  Patient status post CABG. EXAM: PORTABLE CHEST 1 VIEW COMPARISON:  PA and lateral chest 10/08/2020. FINDINGS: ET tube is in good  position at the level of the clavicular heads. Right IJ approach Swan Ganz catheter tip is in the right main pulmonary artery. Left chest tube and mediastinal drain are noted. Mild linear atelectasis left lower lung zone is noted. The lungs are otherwise clear. No pneumothorax. Heart size is normal. The visualized skeletal structures are unremarkable. IMPRESSION: Support tubes and lines project in good position. No pneumothorax or acute disease. Electronically Signed   By: Drusilla Kanner M.D.   On: 10/10/2020 14:44   ECHO INTRAOPERATIVE TEE  Result Date: 10/10/2020  *INTRAOPERATIVE TRANSESOPHAGEAL REPORT *  Patient Name:   Russell Morrow Date of Exam: 10/10/2020 Medical Rec #:  [id number]   Height:       74.0 in Accession #:    [id number]  Weight:       250.2 lb Date of Birth:  1950-07-11  BSA:          2.39 m Patient Age:    69 years    BP:           129/90 mmHg Patient Gender: M           HR:           85 bpm. Exam Location:  Inpatient Transesophogeal exam was perform intraoperatively during surgical procedure. Patient was closely monitored under general anesthesia during the entirety of examination. Indications:     CABG Performing Phys: [id number] Z ATKINS Diagnosing Phys: Earl Lites [place]oltz[place]us Complications: No known complications during this procedure. POST-OP IMPRESSIONS - Left Ventricle: has normal systolic function. The cavity size was normal. The wall motion is normal. - Right Ventricle: The right ventricle appears unchanged from pre-bypass. - Aorta: The aorta appears unchanged from pre-bypass. - Left Atrial Appendage: The left atrial appendage appears unchanged from pre-bypass. - Aortic Valve: The aortic valve appears unchanged from pre-bypass. - Mitral Valve: The mitral valve appears unchanged from pre-bypass. - Tricuspid Valve: The tricuspid valve appears unchanged from pre-bypass. - Pulmonic Valve: The pulmonic valve appears unchanged from pre-bypass. - Interatrial Septum: The interatrial septum  appears unchanged from pre-bypass. - Pericardium: The pericardium appears unchanged from pre-bypass. - Comments: S/P CABG X 2. Emergence on low dose phenylephrine gtt. No new or worsening wall motion or valvular abnormalities. Mild central MR unchanged. Preserved EF. PRE-OP FINDINGS  Left Ventricle: The left ventricle has normal systolic function, with an ejection fraction of 60-65%. The cavity size was normal. There is moderately increased left ventricular wall thickness. No evidence of left ventricular regional wall motion abnormalities. There is moderate concentric left ventricular hypertrophy. Left ventricular diastolic parameters are consistent with Grade I diastolic dysfunction (impaired relaxation). Right Ventricle: The right ventricle has normal systolic function. The cavity was normal. There is no increase in right ventricular wall thickness. Right ventricular systolic pressure is normal. There is no aneurysm seen. Left Atrium: Left atrial size was normal in size. No left atrial/left atrial appendage thrombus was detected. There is echo contrast seen in the left atrial cavity. Left atrial appendage velocity is normal at greater than 40 cm/s. Right Atrium: Right atrial size was normal in size. Interatrial Septum: No atrial level shunt detected by color flow Doppler. The interatrial septum appears to be lipomatous. There is no evidence of a patent foramen ovale. Pericardium: There is no evidence of pericardial effusion. There is no evidence of cardiac tamponade. There is no pleural effusion. Mitral Valve: The mitral valve is normal in structure. Mitral valve regurgitation is mild by color flow Doppler. The MR jet is centrally-directed. There is no evidence of mitral valve vegetation. There is No evidence of mitral stenosis. Tricuspid Valve: The tricuspid valve was normal in structure. Tricuspid valve regurgitation is trivial by color flow Doppler. No evidence of tricuspid stenosis is present. There is no  evidence of tricuspid valve vegetation. Aortic Valve: The aortic valve is tricuspid Aortic valve regurgitation was not visualized by color flow Doppler. There is no stenosis of the aortic valve, with a calculated valve area of 3.35 cm. There is no evidence of aortic valve vegetation. There is mild thickening and mild calcification present  with normal mobility. Pulmonic Valve: The pulmonic valve was normal in structure, with normal. No evidence of pumonic stenosis. Pulmonic valve regurgitation is trivial by color flow Doppler. Aorta: The aortic root are normal in size and structure. There is mild dilatation of the ascending aorta, measuring 40 mm. Pulmonary Artery: Theone Murdoch catheter present on the right. The pulmonary artery is of normal size. Venous: The inferior vena cava measures 1.90 cm, is normal in size with greater than 50% respiratory variability, suggesting right atrial pressure of 3 mmHg. Shunts: There is no evidence of an atrial septal defect. +--------------+--------++ LEFT VENTRICLE         +----------------+---------++ +--------------+--------++ Diastology                PLAX 2D                +----------------+---------++ +--------------+--------++ LV e' lateral:  8.54 cm/s LVIDd:        3.52 cm  +----------------+---------++ +--------------+--------++ LV E/e' lateral:7.2       LVIDs:        2.34 cm  +----------------+---------++ +--------------+--------++ LV e' medial:   5.45 cm/s LV IVS:       1.60 cm  +----------------+---------++ +--------------+--------++ LV E/e' medial: 11.3      LVOT diam:    2.40 cm  +----------------+---------++ +--------------+--------++ LV SV:        33 ml    +----------------------+-------++ +--------------+--------++ 2D Longitudinal Strain        LV SV Index:  13.25    +----------------------+-------++ +--------------+--------++ 2D Strain GLS (A2C):  -10.8 % LVOT Area:    4.52 cm  +----------------------+-------++ +--------------+--------++ 2D Strain GLS (A4C):  -13.8 %                        +----------------------+-------++ +--------------+--------++ +------------------+-----------++ AORTIC VALVE                  +------------------+-----------++ AV Area (Vmax):   2.95 cm    +------------------+-----------++ AV Area (Vmean):  3.16 cm    +------------------+-----------++ AV Area (VTI):    3.35 cm    +------------------+-----------++ AV Vmax:          144.00 cm/s +------------------+-----------++ AV Vmean:         94.500 cm/s +------------------+-----------++ AV VTI:           0.247 m     +------------------+-----------++ AV Peak Grad:     8.3 mmHg    +------------------+-----------++ AV Mean Grad:     4.0 mmHg    +------------------+-----------++ LVOT Vmax:        94.00 cm/s  +------------------+-----------++ LVOT Vmean:       66.000 cm/s +------------------+-----------++ LVOT VTI:         0.183 m     +------------------+-----------++ LVOT/AV VTI ratio:0.74        +------------------+-----------++  +-------------+-------++ AORTA                +-------------+-------++ Ao Root diam:3.70 cm +-------------+-------++ Ao STJ diam: 3.4 cm  +-------------+-------++ Ao Asc diam: 4.00 cm +-------------+-------++ +--------------+----------++ MITRAL VALVE               +--------------+-------+ +--------------+----------++   SHUNTS                MV Area (PHT):3.19 cm     +--------------+-------+ +--------------+----------++   Systemic VTI: 0.18 m  MV Peak grad: 2.3 mmHg     +--------------+-------+ +--------------+----------++   Systemic Diam:2.40 cm MV Mean  grad: 1.0 mmHg     +--------------+-------+ +--------------+----------++ MV Vmax:      0.76 m/s   +--------------+----------++ MV Vmean:     40.8 cm/s  +--------------+----------++ MV PHT:       69.02 msec  +--------------+----------++ MV Decel Time:238 msec   +--------------+----------++ +---------------+-----------++ MR Peak grad:  75.0 mmHg   +---------------+-----------++ MR Mean grad:  52.0 mmHg   +---------------+-----------++ MR Vmax:       433.00 cm/s +---------------+-----------++ MR Vmean:      338.0 cm/s  +---------------+-----------++ MR PISA:       1.01 cm    +---------------+-----------++ MR PISA Radius:0.40 cm     +---------------+-----------++ +--------------+------------++ MV E velocity:61.40 cm/s   +--------------+------------++ MV A velocity:7220.00 cm/s +--------------+------------++ MV E/A ratio: 0.01         +--------------+------------++ +---------+-------+ IVC              +---------+-------+ IVC diam:1.90 cm +---------+-------+  Hester Mates Electronically signed by Hester Mates Signature Date/Time: 10/10/2020/3:07:47 PM    Final    VAS US DOPPLER PRE CABG  Result Date: 10/09/2020 PREOPERATIVE VASCULAR EVALUATION Patient Name:  Russell Morrow  Date of Exam:   10/09/2020 Medical Rec #: [id number]    Accession #:    [id number] Date of Birth: [birth year]   Patient Gender: M Patient Age:   69Y Exam Location:  [facility] Procedure:      VAS US DOPPLER PRE CABG Referring Phys: [id number] BROADUS Z ATKINS --------------------------------------------------------------------------------  Indications:      Pre-CABG. Risk Factors:     Hypertension, Diabetes, prior MI. Comparison Study: 11/22/2015- carotid artery duplex: no stenosis >50% Performing Technologist: Gertie Fey MHA, RVT, RDCS, RDMS  Examination Guidelines: A complete evaluation includes B-mode imaging, spectral Doppler, color Doppler, and power Doppler as needed of all accessible portions of each vessel. Bilateral testing is considered an integral part of a complete examination. Limited examinations for reoccurring indications may be performed as noted.  Right Carotid Findings:  +----------+--------+--------+--------+-----------------------+--------+           PSV cm/sEDV cm/sStenosisDescribe               Comments +----------+--------+--------+--------+-----------------------+--------+ CCA Prox  83      12                                              +----------+--------+--------+--------+-----------------------+--------+ CCA Distal54      11                                              +----------+--------+--------+--------+-----------------------+--------+ ICA Prox  67      12                                              +----------+--------+--------+--------+-----------------------+--------+ ICA Distal43      9                                               +----------+--------+--------+--------+-----------------------+--------+ ECA       91  9               smooth and heterogenous         +----------+--------+--------+--------+-----------------------+--------+ Portions of this table do not appear on this page. +----------+--------+-------+----------------+------------+           PSV cm/sEDV cmsDescribe        Arm Pressure +----------+--------+-------+----------------+------------+ Subclavian158            Multiphasic, WNL             +----------+--------+-------+----------------+------------+ +---------+--------+--+--------+-+---------+ VertebralPSV cm/s27EDV cm/s7Antegrade +---------+--------+--+--------+-+---------+ Left Carotid Findings: +----------+--------+--------+--------+--------+--------+           PSV cm/sEDV cm/sStenosisDescribeComments +----------+--------+--------+--------+--------+--------+ CCA Prox  84      11                               +----------+--------+--------+--------+--------+--------+ CCA Distal68      11                               +----------+--------+--------+--------+--------+--------+ ICA Prox  54      11                                +----------+--------+--------+--------+--------+--------+ ICA Distal77      17                               +----------+--------+--------+--------+--------+--------+ ECA       125     10                               +----------+--------+--------+--------+--------+--------+ +----------+--------+--------+----------------+------------+ SubclavianPSV cm/sEDV cm/sDescribe        Arm Pressure +----------+--------+--------+----------------+------------+           284             Multiphasic, WNL             +----------+--------+--------+----------------+------------+ +---------+--------+--+--------+-+---------+ VertebralPSV cm/s40EDV cm/s9Antegrade +---------+--------+--+--------+-+---------+  ABI Findings: +--------+------------------+-----+---------+----------------------------------+ Right   Rt Pressure (mmHg)IndexWaveform Comment                            +--------+------------------+-----+---------+----------------------------------+ Brachial                       triphasicUnable to obtain pressure due to                                           TR band                            +--------+------------------+-----+---------+----------------------------------+ PTA     208               1.44 triphasic                                   +--------+------------------+-----+---------+----------------------------------+ DP      177               1.23 triphasic                                   +--------+------------------+-----+---------+----------------------------------+ +--------+------------------+-----+---------+-------+  Left    Lt Pressure (mmHg)IndexWaveform Comment +--------+------------------+-----+---------+-------+ [id number]                    triphasic        +--------+------------------+-----+---------+-------+ PTA     184               1.28 triphasic        +--------+------------------+-----+---------+-------+ DP      180                1.25 triphasic        +--------+------------------+-----+---------+-------+ +-------+---------------+----------------+ ABI/TBIToday's ABI/TBIPrevious ABI/TBI +-------+---------------+----------------+ Right  1.44                            +-------+---------------+----------------+ Left   1.28                            +-------+---------------+----------------+  Right Doppler Findings: +-----------+--------+-----+---------+----------------------------------------+ Site       PressureIndexDoppler  Comments                                 +-----------+--------+-----+---------+----------------------------------------+ Brachial                triphasicUnable to obtain pressure due to TR band +-----------+--------+-----+---------+----------------------------------------+ Radial                  triphasic                                         +-----------+--------+-----+---------+----------------------------------------+ Ulnar                   triphasic                                         +-----------+--------+-----+---------+----------------------------------------+ Palmar Arch                      Within normal limits                     +-----------+--------+-----+---------+----------------------------------------+  Left Doppler Findings: +-----------+--------+-----+---------+-----------------------------------------+ Site       PressureIndexDoppler  Comments                                  +-----------+--------+-----+---------+-----------------------------------------+ Brachial   144          triphasic                                          +-----------+--------+-----+---------+-----------------------------------------+ Radial                  triphasic                                          +-----------+--------+-----+---------+-----------------------------------------+ Ulnar                   triphasic                                           +-----------+--------+-----+---------+-----------------------------------------+  Palmar Arch                      Signal decreases >50% with radial                                          compression, obliterates with ulnar                                        compression.                              +-----------+--------+-----+---------+-----------------------------------------+  Summary: Right Carotid: Velocities in the right ICA are consistent with a 1-39% stenosis. Left Carotid: Velocities in the left ICA are consistent with a 1-39% stenosis. Vertebrals:  Bilateral vertebral arteries demonstrate antegrade flow. Subclavians: Normal flow hemodynamics were seen in bilateral subclavian              arteries. Right ABI: Resting right ankle-brachial index is elevated, suggestive of median artery calcification. Waveforms are within normal limits at rest. Left ABI: Resting left ankle-brachial index is within normal range. No evidence of significant left lower extremity arterial disease. Right Upper Extremity: Doppler waveforms remain within normal limits with right radial compression. Doppler waveforms remain within normal limits with right ulnar compression. Left Upper Extremity: Doppler waveforms decrease >50% with left radial compression. Doppler waveform obliterate with left ulnar compression.  Electronically signed by Gretta Began MD on 10/09/2020 at 8:16:21 PM.    Final     Assessment/Plan: S/P Procedure(s) (LRB): CORONARY ARTERY BYPASS GRAFTING (CABG)X2. LEFT INTERNAL MAMMARY ARTERY. RIGHT ENDOSCOPIC SAPHENOUS VEIN HARVESTING. (N/A) TRANSESOPHAGEAL ECHOCARDIOGRAM (TEE) (N/A) INDOCYANINE GREEN FLUORESCENCE IMAGING (ICG) (N/A)  1 afeb, Hemodyn stable on nitro, remove lines, maintaining sinus rhythm, was on some HTN meds preop 2 sats ok on 4 liters 3 CT 740 cc yesterday post op, 150 cc far today- keep for now 4 reasonable UOP, weight up about 8 KG, will need some  diuretic 5 minor expected ABLA 6 minor thrombocytopenia 7 BS controlled- d/c insulin gtt 8 Minor hyponatremia- monitor, K+ 3.7 replace 9 normal renal fxn 10 routine progression, push pulm toilet and rehab as able     LOS: 2 days    Rowe Clack PA-C Pager [phone] 10/11/2020  Agree with above Doing well Will wean gtts, and remove a-line and swan Ambulation today  Corliss Skains

## 2020-10-12 ENCOUNTER — Other Ambulatory Visit (HOSPITAL_COMMUNITY): Payer: Self-pay

## 2020-10-12 ENCOUNTER — Inpatient Hospital Stay (HOSPITAL_COMMUNITY): Payer: Medicare Other

## 2020-10-12 DIAGNOSIS — I200 Unstable angina: Secondary | ICD-10-CM | POA: Diagnosis not present

## 2020-10-12 LAB — CBC
HCT: 38.7 % — ABNORMAL LOW (ref 39.0–52.0)
Hemoglobin: 13.6 g/dL (ref 13.0–17.0)
MCH: 32.9 pg (ref 26.0–34.0)
MCHC: 35.1 g/dL (ref 30.0–36.0)
MCV: 93.7 fL (ref 80.0–100.0)
Platelets: 143 10*3/uL — ABNORMAL LOW (ref 150–400)
RBC: 4.13 MIL/uL — ABNORMAL LOW (ref 4.22–5.81)
RDW: 13.2 % (ref 11.5–15.5)
WBC: 13 10*3/uL — ABNORMAL HIGH (ref 4.0–10.5)
nRBC: 0 % (ref 0.0–0.2)

## 2020-10-12 LAB — BASIC METABOLIC PANEL
Anion gap: 7 (ref 5–15)
BUN: 15 mg/dL (ref 8–23)
CO2: 27 mmol/L (ref 22–32)
Calcium: 7.6 mg/dL — ABNORMAL LOW (ref 8.9–10.3)
Chloride: 99 mmol/L (ref 98–111)
Creatinine, Ser: 0.83 mg/dL (ref 0.61–1.24)
GFR, Estimated: >60 mL/min (ref >60)
Glucose, Bld: 173 mg/dL — ABNORMAL HIGH (ref 70–99)
Potassium: 3.7 mmol/L (ref 3.5–5.1)
Sodium: 133 mmol/L — ABNORMAL LOW (ref 135–145)

## 2020-10-12 LAB — GLUCOSE, CAPILLARY
Glucose-Capillary: 163 mg/dL — ABNORMAL HIGH (ref 70–99)
Glucose-Capillary: 167 mg/dL — ABNORMAL HIGH (ref 70–99)
Glucose-Capillary: 179 mg/dL — ABNORMAL HIGH (ref 70–99)
Glucose-Capillary: 182 mg/dL — ABNORMAL HIGH (ref 70–99)
Glucose-Capillary: 185 mg/dL — ABNORMAL HIGH (ref 70–99)
Glucose-Capillary: 192 mg/dL — ABNORMAL HIGH (ref 70–99)
Glucose-Capillary: 269 mg/dL — ABNORMAL HIGH (ref 70–99)

## 2020-10-12 MED ORDER — ALUM & MAG HYDROXIDE-SIMETH 200-200-20 MG/5ML PO SUSP
30.0000 mL | ORAL | Status: DC | PRN
  Administered 2020-10-12 – 2020-10-15 (×10): 30 mL via ORAL
  Filled 2020-10-12 (×11): qty 30

## 2020-10-12 MED ORDER — IRBESARTAN 300 MG PO TABS
300.0000 mg | ORAL_TABLET | Freq: Every day | ORAL | Status: DC
  Administered 2020-10-12 – 2020-10-15 (×4): 300 mg via ORAL
  Filled 2020-10-12 (×4): qty 1

## 2020-10-12 MED ORDER — POTASSIUM CHLORIDE CRYS ER 20 MEQ PO TBCR
20.0000 meq | EXTENDED_RELEASE_TABLET | ORAL | Status: DC
  Administered 2020-10-12 (×2): 20 meq via ORAL
  Filled 2020-10-12 (×2): qty 1

## 2020-10-12 MED ORDER — POTASSIUM CHLORIDE CRYS ER 20 MEQ PO TBCR
40.0000 meq | EXTENDED_RELEASE_TABLET | Freq: Once | ORAL | Status: AC
  Administered 2020-10-12: 40 meq via ORAL
  Filled 2020-10-12: qty 2

## 2020-10-12 MED ORDER — INSULIN DETEMIR 100 UNIT/ML ~~LOC~~ SOLN
10.0000 [IU] | Freq: Two times a day (BID) | SUBCUTANEOUS | Status: DC
  Administered 2020-10-12 – 2020-10-15 (×7): 10 [IU] via SUBCUTANEOUS
  Filled 2020-10-12 (×9): qty 0.1

## 2020-10-12 MED ORDER — FUROSEMIDE 10 MG/ML IJ SOLN
40.0000 mg | Freq: Once | INTRAMUSCULAR | Status: AC
  Administered 2020-10-12: 40 mg via INTRAVENOUS
  Filled 2020-10-12: qty 4

## 2020-10-12 NOTE — Hospital Course (Addendum)
  Referring: No ref. provider found Primary Care: Benita Stabile, MD Primary Cardiologist:Branch, Christiane Ha, MD       History of Present Illness:       Russell Morrow is a 70 year old male patient with past medical history significant for paroxysmal supraventricular tachycardia, hypertension, marijuana use, and diabetes mellitus who presented to the ED with a chief complaint of chest pain. The patient reports that the pain is exactly how it was back in March of 2020 when he has his first MI. He states that it feels like a knot in the subxiphoid area which is constant and sometime sharp. He reports that he takes Nexium twice a day for his acid reflux and at first it would help with the pain but only short term. He did have some nausea with vomiting.  Patient also reports he has associated dyspnea at rest which had been going on for months. The chest pain that brought him to the hospital started around 10am on 10/08/2020. He was taken to the cath lab and his cardiac catheterization showed: ostial to proximal LAS lesion of 40%, proximal RCA lesion of 40%, proximal LAD to mid LAD sternosis of 100%, proximal Circumflex stenosis of 90%, and an estimated LVEF of 55-65%. Given the multivessel CAD in a diabetic patient, Cardiology recommended consideration for CABG. We are consulted for possible surgical revascularization.    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 He lives in [place]. He has a couple of alcohol drinks per night but has never had any withdrawal symptoms. His father underwent CABG at age 65 and lived into his 15s. His mother also lives into her 90s without any known cardiac disease.  He was independent at home completing all tasks of daily living.   The patient and all relevant studies were evaluated by Dr. Vickey Sages who recommended proceeding with coronary surgery for revascularization.  Hospital course:  Following diagnostic evaluation and consultation with CT surgery he was  medically stable to proceed and on 10/10/2020 taken the operating room at which time he underwent CABG x2.  He tolerated procedure well was taken to the surgical intensive care unit in stable condition.  Postoperative hospital course:  Patient is overall progressed well.  He was weaned from the ventilator using standard protocols without difficulty.  He has maintained stable hemodynamics with some hypertension.  He did take a moderate amount of time to wean nitroglycerin off.  Metoprolol has been uptitrated over time.  Avapro was restarted on postop day 3.  He has maintained normal renal function.  He has good urine output.  His chest tube has had moderate drainage over the first few days hopefully will be discontinued by postop day 4.  He has a mild expected acute blood loss anemia which is stable.  He does have a minor thrombocytopenia with improving trend over time.  He remained hypertensive and his home antihypertensive agents were restarted.  He was maintaining NSR and his pacing wires were removed without difficulty.  He is ambulating independently.  His surgical incisions are healing without evidence of infection.  He is medically stable for discharge home today.

## 2020-10-12 NOTE — Consult Note (Signed)
   [facility] Inpatient Consult   10/12/2020  JAQUARIUS SEDER Jan 01, 1951 [id number]  [facility] [[facility]]  Accountable Care Organization [ACO] Patient: [facility]   Patient is currently active with [facility] [[facility]] Care Management for chronic disease management services.  Patient has been engaged by a [facility] - NP.  Our community based plan of care has focused on disease management and community resource support.    Plan: Patient is currently in ICU level of care and will follow for progress and transition of care needs when appropriate. Notified [facility] G-NP of patient transitioning home today per notes.  Of note, [facility] Care Management services does not replace or interfere with any services that are needed or arranged by inpatient [facility] care management team.  For additional questions or referrals please contact:  Charlesetta Shanks, RN BSN CCM [facility]  [phone] business mobile phone Toll free office [phone]  Fax number: [phone] [email or web address]

## 2020-10-12 NOTE — Plan of Care (Signed)
  Problem: Education: Goal: Knowledge of General Education information will improve Description: Including pain rating scale, medication(s)/side effects and non-pharmacologic comfort measures Outcome: Progressing   Problem: Health Behavior/Discharge Planning: Goal: Ability to manage health-related needs will improve Outcome: Progressing   Problem: Clinical Measurements: Goal: Ability to maintain clinical measurements within normal limits will improve Outcome: Progressing Goal: Will remain free from infection Outcome: Progressing Goal: Diagnostic test results will improve Outcome: Progressing Goal: Respiratory complications will improve Outcome: Progressing Goal: Cardiovascular complication will be avoided Outcome: Progressing   Problem: Activity: Goal: Risk for activity intolerance will decrease Outcome: Progressing   Problem: Coping: Goal: Level of anxiety will decrease Outcome: Progressing   Problem: Pain Managment: Goal: General experience of comfort will improve Outcome: Progressing   Problem: Safety: Goal: Ability to remain free from injury will improve Outcome: Progressing   Problem: Skin Integrity: Goal: Risk for impaired skin integrity will decrease Outcome: Progressing   Problem: Education: Goal: Will demonstrate proper wound care and an understanding of methods to prevent future damage Outcome: Progressing Goal: Knowledge of disease or condition will improve Outcome: Progressing Goal: Knowledge of the prescribed therapeutic regimen will improve Outcome: Progressing Goal: Individualized Educational Video(s) Outcome: Progressing   Problem: Activity: Goal: Risk for activity intolerance will decrease Outcome: Progressing   Problem: Cardiac: Goal: Will achieve and/or maintain hemodynamic stability Outcome: Progressing   Problem: Clinical Measurements: Goal: Postoperative complications will be avoided or minimized Outcome: Progressing   Problem:  Respiratory: Goal: Respiratory status will improve Outcome: Progressing   Problem: Skin Integrity: Goal: Wound healing without signs and symptoms of infection Outcome: Progressing Goal: Risk for impaired skin integrity will decrease Outcome: Progressing   Problem: Urinary Elimination: Goal: Ability to achieve and maintain adequate renal perfusion and functioning will improve Outcome: Progressing

## 2020-10-12 NOTE — Discharge Summary (Signed)
Physician Discharge Summary  Patient ID: Russell Morrow MRN: [id number] DOB/AGE: Oct 10, 1950 70 y.o.  Admit date: 10/08/2020 Discharge date: 10/15/2020  Admission Diagnoses:unstable angina  Discharge Diagnoses:  Principal Problem:   Unstable angina [facility]) Active Problems:   Hypertension   Hypokalemia   Type 2 diabetes mellitus (HCC)   CAD (coronary artery disease)   GERD (gastroesophageal reflux disease)   S/P CABG x 2   Patient Active Problem List   Diagnosis Date Noted  . S/P CABG x 2 10/10/2020  . Unstable angina (HCC) 10/08/2020  . IDA (iron deficiency anemia) 01/09/2020  . GERD (gastroesophageal reflux disease) 01/09/2020  . Abdominal pain 12/06/2019  . Hypertensive emergency 08/13/2018  . CAD (coronary artery disease) 08/13/2018  . NSTEMI (non-ST elevated myocardial infarction) (HCC) 08/13/2018  . Atrial tachycardia (HCC)   . Hypokalemia 07/28/2018  . Hypophosphatemia 07/28/2018  . Leukocytosis 07/28/2018  . Type 2 diabetes mellitus (HCC) 07/28/2018  . Umbilical hernia 04/17/2017  . Inguinal hernia, bilateral 04/17/2017  . Cholelithiasis 04/17/2017  . Abdominal pain, epigastric 04/17/2017  . Lesion of both native kidneys 04/17/2017  . Fatty liver 04/17/2017  . Hypertension     Referring: No ref. provider found Primary Care: Benita Stabile, MD Primary Cardiologist:Branch, Christiane Ha, MD       History of Present Illness:       Russell Morrow is a 70 year old male patient with past medical history significant for paroxysmal supraventricular tachycardia, hypertension, marijuana use, and diabetes mellitus who presented to the ED with a chief complaint of chest pain. The patient reports that the pain is exactly how it was back in March of 2020 when he has his first MI. He states that it feels like a knot in the subxiphoid area which is constant and sometime sharp. He reports that he takes Nexium twice a day for his acid reflux and at first it would help with the pain but only  short term. He did have some nausea with vomiting.  Patient also reports he has associated dyspnea at rest which had been going on for months. The chest pain that brought him to the hospital started around 10am on 10/08/2020. He was taken to the cath lab and his cardiac catheterization showed: ostial to proximal LAS lesion of 40%, proximal RCA lesion of 40%, proximal LAD to mid LAD sternosis of 100%, proximal Circumflex stenosis of 90%, and an estimated LVEF of 55-65%. Given the multivessel CAD in a diabetic patient, Cardiology recommended consideration for CABG. We are consulted for possible surgical revascularization.    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 He has a couple of alcohol drinks per night but has never had any withdrawal symptoms. His father underwent CABG at age 35 and lived into his 47s. His mother also lives into her 90s without any known cardiac disease.  He was independent at home completing all tasks of daily living.   The patient and all relevant studies were evaluated by Dr. Vickey Sages who recommended proceeding with coronary surgery for revascularization.  Hospital course:  Following diagnostic evaluation and consultation with CT surgery he was medically stable to proceed and on 10/10/2020 taken the operating room at which time he underwent CABG x2.  He tolerated procedure well was taken to the surgical intensive care unit in stable condition.  Postoperative hospital course:  Patient is overall progressed well.  He was weaned from the ventilator using standard protocols without difficulty.  He has maintained  stable hemodynamics with some hypertension.  He did take a moderate amount of time to wean nitroglycerin off.  Metoprolol has been uptitrated over time.  Avapro was restarted on postop day 3.  He has maintained normal renal function.  He has good urine output.  His chest tube has had moderate drainage over the first few days hopefully  will be discontinued by postop day 4.  He has a mild expected acute blood loss anemia which is stable.  He does have a minor thrombocytopenia with improving trend over time.  He remained hypertensive and his home antihypertensive agents were restarted.  He was maintaining NSR and his pacing wires were removed without difficulty.  He is ambulating independently.  His surgical incisions are healing without evidence of infection.  He is medically stable for discharge home today.   Discharged Condition: good   Consults: cardiology  Significant Diagnostic Studies:  Procedures  LEFT HEART CATH AND CORONARY ANGIOGRAPHY    Conclusion    Ost LAD to Prox LAD lesion is 40% stenosed.  Previously placed Prox Cx to Mid Cx drug eluting stent is widely patent.  RPAV lesion is 30% stenosed.  Prox RCA lesion is 40% stenosed.  Prox LAD to Mid LAD lesion is 100% stenosed.  Prox Cx lesion is 90% stenosed.  The left ventricular systolic function is normal.  LV end diastolic pressure is normal.  The left ventricular ejection fraction is 55-65% by visual estimate.   1. Severe 2 vessel obstructive/occlusive CAD. There is occlusion of the mid LAD immediately after the first diagonal. The LAD fills well by right to left collaterals. There is severe segmental disease in the proximal LCx prior to the previous stent 2. Normal LV function 3. Normal LVEDP  Plan: given multivessel CAD in a diabetic I would recommend consideration for CABG.   Treatments: surgery:         CARDIOTHORACIC SURGERY OPERATIVE NOTE  Date of Procedure:    10/10/2020  Preoperative Diagnosis:      Severe 3-vessel Coronary Artery Disease  Postoperative Diagnosis:    Same  Procedure:        Coronary Artery Bypass Grafting x 2              Left Internal Mammary Artery to Distal Left Anterior Descending Coronary Arteryl; Saphenous Vein Graft to Obtuse Marginal Branch of Left Circumflex Coronary Artery; Endoscopic  Vein Harvest from right Thigh    Surgeon:        B. Lorayne MarekZane Atkins, MD  Assistant:       Webb LawsW. Loucille Takach, PA-C  Anesthesia:get  Operative Findings: ? preserved left ventricular systolic function ? good quality left internal mammary artery conduit ? good quality saphenous vein conduit ? good quality target vessels for grafting         Discharge Exam: Blood pressure (!) 153/72, pulse 84, temperature 98.9 F (37.2 C), temperature source Oral, resp. rate 16, height 6\' 1"  (1.854 m), weight 114.3 kg, SpO2 100 %.    General appearance: alert, cooperative and no distress Heart: regular rate and rhythm Lungs: clear to auscultation bilaterally Abdomen: benign Extremities: trace edema Wound: incis healing well- minor sternal drainage, no purulence, no erethema  Disposition: Discharge disposition: 01-Home or Self Care       Discharge Instructions    AMB Referral to [facility]   Complete by: As directed    Reason For Referral: Lipids   Amb Referral to Cardiac Rehabilitation   Complete by: As directed  Diagnosis:  CABG NSTEMI     CABG X ___: 2   After initial evaluation and assessments completed: Virtual Based Care may be provided alone or in conjunction with Phase 2 Cardiac Rehab based on patient barriers.: Yes     Allergies as of 10/15/2020      Reactions   Statins    Muscle aches, liver pain; Tried Atorvastatin, Crestor, Pravastatin, and Simvastatin      Medication List    STOP taking these medications   ibuprofen 200 MG tablet Commonly known as: ADVIL     TAKE these medications   acetaminophen 500 MG tablet Commonly known as: TYLENOL Take 1,000 mg by mouth every 6 (six) hours as needed for moderate pain.   amLODipine 10 MG tablet Commonly known as: NORVASC Take 1 tablet (10 mg total) by mouth daily.   aspirin 81 MG EC tablet Take 1 tablet (81 mg total) by mouth daily.   BD Pen Needle Nano U/F 32G X 4 MM Misc Generic drug: Insulin Pen  Needle Inject into the skin.   cephALEXin 500 MG capsule Commonly known as: KEFLEX Take 1 capsule (500 mg total) by mouth every 8 (eight) hours for 7 days.   dicyclomine 10 MG capsule Commonly known as: BENTYL Take 10 mg by mouth 4 (four) times daily as needed for spasms.   esomeprazole 20 MG capsule Commonly known as: NEXIUM Take 20 mg by mouth daily as needed (acid reflux).   ezetimibe 10 MG tablet Commonly known as: ZETIA Take 10 mg by mouth daily.   FeroSul 325 (65 FE) MG tablet Generic drug: ferrous sulfate Take 325 mg by mouth 2 (two) times daily.   furosemide 40 MG tablet Commonly known as: Lasix Take 1 tablet (40 mg total) by mouth daily. What changed: when to take this   HumaLOG KwikPen 100 UNIT/ML KwikPen Generic drug: insulin lispro Inject 10-16 Units into the skin with breakfast, with lunch, and with evening meal. Sliding Scale:  CBG 140-180 = 10 units, 181-220 = 12 units, 221-260 = 14 units, 261-300 = 16 units , call MD over 300   Lantus SoloStar 100 UNIT/ML Solostar Pen Generic drug: insulin glargine Inject 35 Units into the skin 2 (two) times daily. What changed: how much to take   metoprolol succinate 100 MG 24 hr tablet Commonly known as: TOPROL-XL Take 100 mg by mouth daily.   olmesartan 40 MG tablet Commonly known as: BENICAR Take 1 tablet (40 mg total) by mouth daily.   oxyCODONE 5 MG immediate release tablet Commonly known as: Oxy IR/ROXICODONE Take 1 tablet (5 mg total) by mouth every 6 (six) hours as needed for up to 7 days for severe pain.   potassium chloride SA 20 MEQ tablet Commonly known as: KLOR-CON Take 1 tablet (20 mEq total) by mouth 2 (two) times daily.   pregabalin 100 MG capsule Commonly known as: LYRICA Take 100 mg by mouth at bedtime.   rOPINIRole 1 MG tablet Commonly known as: REQUIP Take 2 mg by mouth at bedtime.   spironolactone 25 MG tablet Commonly known as: ALDACTONE Take 1 tablet (25 mg total) by mouth daily.    tadalafil 10 MG tablet Commonly known as: CIALIS Take 10 mg by mouth daily as needed for erectile dysfunction.   temazepam 15 MG capsule Commonly known as: RESTORIL Take 15 mg by mouth at bedtime.   Trulicity 0.75 MG/0.5ML Sopn Generic drug: Dulaglutide Inject 1.5 mg into the skin once a week. Friday  Voltaren 1 % Gel Generic drug: diclofenac Sodium Apply 2 g topically 4 (four) times daily as needed (pain).       Follow-up Information    [name], MD Follow up.   Specialty: Cardiothoracic Surgery Why: Please see discharge paperwork for follow-up appointment with surgeon. Contact information: [address] [place] [address] [phone]        Antoine Poche, MD Follow up.   Specialty: Cardiology Why: Please see discharge paperwork for follow-up appointment with cardiology  Contact information: [address] [place] [address] [phone]             The patient has been discharged on:   1.Beta Blocker:  Yes [place].Etienne  ]                              No   [   ]                              If No, reason:  2.Ace Inhibitor/ARB: Yes [  y]                                     No  [    ]                                     If No, reason: 3.Statin:   Yes [  ]                  No  [N   ]                  If No, reason: intol, will be referred to lipid clinic post-op. Now on  zetia   4.Marlowe KaysValentino Hue  [  y ]                  No   [   ]                  If No, reason:   Signed: Rowe Clack PA-C 10/15/2020, 10:58 AM

## 2020-10-12 NOTE — Progress Notes (Signed)
[facility] BRIEF SICU PROGRESS NOTE  2 Days Post-Op  S/P Procedure(s) (LRB): CORONARY ARTERY BYPASS GRAFTING (CABG)X2. LEFT INTERNAL MAMMARY ARTERY. RIGHT ENDOSCOPIC SAPHENOUS VEIN HARVESTING. (N/A) TRANSESOPHAGEAL ECHOCARDIOGRAM (TEE) (N/A) INDOCYANINE GREEN FLUORESCENCE IMAGING (ICG) (N/A)   Stable day  Plan: Continue current plan  Purcell Nails, MD 10/12/2020 6:25 PM

## 2020-10-12 NOTE — Discharge Instructions (Signed)
[facility] office [phone]   Endoscopic Saphenous Vein Harvesting, Care After This sheet gives you information about how to care for yourself after your procedure. Your health care provider may also give you more specific instructions. If you have problems or questions, contact your health care provider. What can I expect after the procedure? After the procedure, it is common to have:  Pain.  Bruising.  Swelling.  Numbness. Follow these instructions at home: Incision care  Follow instructions from your health care provider about how to take care of your incisions. Make sure you: ? Wash your hands with soap and water before and after you change your bandages (dressings). If soap and water are not available, use hand sanitizer. ? Change your dressings as told by your health care provider. ? Leave stitches (sutures), skin glue, or adhesive strips in place. These skin closures may need to stay in place for 2 weeks or longer. If adhesive strip edges start to loosen and curl up, you may trim the loose edges. Do not remove adhesive strips completely unless your health care provider tells you to do that.  Check your incision areas every day for signs of infection. Check for: ? More redness, swelling, or pain. ? Fluid or blood. ? Warmth. ? Pus or a bad smell.   Medicines  Take over-the-counter and prescription medicines only as told by your health care provider.  Ask your health care provider if the medicine prescribed to you requires you to avoid driving or using heavy machinery. General instructions  Raise (elevate) your legs above the level of your heart while you are sitting or lying down.  Avoid crossing your legs.  Avoid sitting for long periods of time. Change positions every 30 minutes.  Do any exercises your health care providers have given you. These may include deep breathing, coughing, and walking exercises.  Do not take baths, swim, or use a hot tub until your health care  provider approves. Ask your health care provider if you may take showers. You may only be allowed to take sponge baths.  Wear compression stockings as told by your health care provider. These stockings help to prevent blood clots and reduce swelling in your legs.  Keep all follow-up visits as told by your health care provider. This is important. Contact a health care provider if:  Medicine does not help your pain.  Your pain gets worse.  You have new leg bruises or your leg bruises get bigger.  Your leg feels numb.  You have more redness, swelling, or pain around your incision.  You have fluid or blood coming from your incision.  Your incision feels warm to the touch.  You have pus or a bad smell coming from your incision.  You have a fever. Get help right away if:  Your pain is severe.  You develop pain, tenderness, warmth, redness, or swelling in any part of your leg.  You have chest pain.  You have trouble breathing. Summary  Raise (elevate) your legs above the level of your heart while you are sitting or lying down.  Wear compression stockings as told by your health care provider.  Make sure you know which symptoms should prompt you to contact your health care provider.  Keep all follow-up visits as told by your health care provider. This information is not intended to replace advice given to you by your health care provider. Make sure you discuss any questions you have with your health care provider. Document Revised: 05/03/2018   Document Reviewed: 05/03/2018 Elsevier Patient Education  2021 [employer] Coronary Artery Bypass Grafting, Care After This sheet gives you information about how to care for yourself after your procedure. Your doctor may also give you more specific instructions. If you have problems or questions, call your doctor. What can I expect after the procedure? After the procedure, it is common to:  Feel sick to your stomach (nauseous).  Not  want to eat as much as normal (lack of appetite).  Have trouble pooping (constipation).  Have weakness and tiredness (fatigue).  Feel sad (depressed) or grouchy (irritable).  Have pain or discomfort around the cuts from surgery (incisions). Follow these instructions at home: Medicines  Take over-the-counter and prescription medicines only as told by your doctor. Do not stop taking medicines or start any new medicines unless your doctor says it is okay.  If you were prescribed an antibiotic medicine, take it as told by your doctor. Do not stop taking the antibiotic even if you start to feel better. Incision care  Follow instructions from your doctor about how to take care of your cuts from surgery. Make sure you: ? Wash your hands with soap and water before and after you change your bandage (dressing). If you cannot use soap and water, use hand sanitizer. ? Change your bandage as told by your doctor. ? Leave stitches (sutures), skin glue, or skin tape (adhesive) strips in place. They may need to stay in place for 2 weeks or longer. If tape strips get loose and curl up, you may trim the loose edges. Do not remove tape strips completely unless your doctor says it is okay.  Make sure the surgery cuts are clean, dry, and protected.  Check your cut areas every day for signs of infection. Check for: ? More redness, swelling, or pain. ? More fluid or blood. ? Warmth. ? Pus or a bad smell.  If cuts were made in your legs: ? Avoid crossing your legs. ? Avoid sitting for long periods of time. Change positions every 30 minutes. ? Raise (elevate) your legs when you are sitting.   Bathing  Do not take baths, swim, or use a hot tub until your doctor says it is okay.  You may shower. Pat the surgery cuts dry. Do not rub the cuts to dry.  Eating and drinking  Eat foods that are high in fiber, such as beans, nuts, whole grains, and raw fruits and vegetables. Any meats you eat should be lean  cut. Avoid canned, processed, and fried foods. This can help prevent trouble pooping. This is also a part of a heart-healthy diet.  Drink enough fluid to keep your pee (urine) pale yellow.  Do not drink alcohol until you are fully recovered. Ask your doctor when it is safe to drink alcohol.   Activity  Rest and limit your activity as told by your doctor. You may be told to: ? Stop any activity right away if you have chest pain, shortness of breath, irregular heartbeats, or dizziness. Get help right away if you have any of these symptoms. ? Move around often for short periods or take short walks as told by your doctor. Slowly increase your activities. ? Avoid lifting, pushing, or pulling anything that is heavier than 10 lb (4.5 kg) for at least 6 weeks or as told by your doctor.  Do physical therapy or a cardiac rehab (cardiac rehabilitation) program as told by your doctor. ? Physical therapy involves doing exercises to maintain movement   and build strength and endurance. ? A cardiac rehab program includes:  Exercise training.  Education.  Counseling.  Do not drive until your doctor says it is okay.  Ask your doctor when you can go back to work.  Ask your doctor when you can be sexually active. General instructions  Do not drive or use heavy machinery while taking prescription pain medicine.  Do not use any products that contain nicotine or tobacco. These include cigarettes, e-cigarettes, and chewing tobacco. If you need help quitting, ask your doctor.  Take 2-3 deep breaths every few hours during the day while you get better. This helps expand your lungs and prevent problems.  If you were given a device called an incentive spirometer, use it several times a day to practice deep breathing. Support your chest with a pillow or your arms when you take deep breaths or cough.  Wear compression stockings as told by your doctor.  Weigh yourself every day. This helps to see if your body  is holding (retaining) fluid that may make your heart and lungs work harder.  Keep all follow-up visits as told by your doctor. This is important. Contact a doctor if:  You have more redness, swelling, or pain around any cut.  You have more fluid or blood coming from any cut.  Any cut feels warm to the touch.  You have pus or a bad smell coming from any cut.  You have a fever.  You have swelling in your ankles or legs.  You have pain in your legs.  You gain 2 lb (0.9 kg) or more a day.  You feel sick to your stomach or you throw up (vomit).  You have watery poop (diarrhea). Get help right away if:  You have chest pain that goes to your jaw or arms.  You are short of breath.  You have a fast or irregular heartbeat.  You notice a "clicking" in your breastbone (sternum) when you move.  You have any signs of a stroke. "BE FAST" is an easy way to remember the main warning signs: ? B - Balance. Signs are dizziness, sudden trouble walking, or loss of balance. ? E - Eyes. Signs are trouble seeing or a change in how you see. ? F - Face. Signs are sudden weakness or loss of feeling of the face, or the face or eyelid drooping on one side. ? A - Arms. Signs are weakness or loss of feeling in an arm. This happens suddenly and usually on one side of the body. ? S - Speech. Signs are sudden trouble speaking, slurred speech, or trouble understanding what people say. ? T - Time. Time to call emergency services. Write down what time symptoms started.  You have other signs of a stroke, such as: ? A sudden, very bad headache with no known cause. ? Feeling sick to your stomach. ? Throwing up. ? Jerky movements you cannot control (seizure). These symptoms may be an emergency. Do not wait to see if the symptoms will go away. Get medical help right away. Call your local emergency services (911 in the [place] Do not drive yourself to the hospital. Summary  After the procedure, it is common to  have pain or discomfort in the cuts from surgery (incisions).  Do not take baths, swim, or use a hot tub until your doctor says it is okay.  Slowly increase your activities. You may need physical therapy or cardiac rehab.  Weigh yourself every day. This helps   to see if your body is holding fluid. This information is not intended to replace advice given to you by your health care provider. Make sure you discuss any questions you have with your health care provider. Document Revised: 02/02/2018 Document Reviewed: 02/02/2018 Elsevier Patient Education  2021 Elsevier Inc.  

## 2020-10-12 NOTE — Progress Notes (Signed)
Progress Note  Patient Name: Russell Morrow Date of Encounter: 10/12/2020  Primary Cardiologist: Dina Rich, MD   Subjective   Had a bad night, no sleep, some pain, loose stools.  Inpatient Medications    Scheduled Meds: . acetaminophen  1,000 mg Oral Q6H   Or  . acetaminophen (TYLENOL) oral liquid 160 mg/5 mL  1,000 mg Per Tube Q6H  . aspirin EC  325 mg Oral Daily   Or  . aspirin  324 mg Per Tube Daily  . bisacodyl  10 mg Oral Daily   Or  . bisacodyl  10 mg Rectal Daily  . Chlorhexidine Gluconate Cloth  6 each Topical Daily  . docusate sodium  200 mg Oral Daily  . enoxaparin (LOVENOX) injection  40 mg Subcutaneous QHS  . ezetimibe  10 mg Oral Daily  . insulin aspart  0-24 Units Subcutaneous Q4H  . mouth rinse  15 mL Mouth Rinse BID  . metoprolol tartrate  50 mg Oral BID  . pantoprazole  40 mg Oral Daily  . potassium chloride  20 mEq Oral Q4H  . sodium chloride flush  10-40 mL Intracatheter Q12H  . sodium chloride flush  3 mL Intravenous Q12H   Continuous Infusions: . sodium chloride Stopped (10/11/20 1114)  . sodium chloride    . sodium chloride 10 mL/hr at 10/12/20 0800  .  ceFAZolin (ANCEF) IV Stopped (10/12/20 0555)  . insulin Stopped (10/11/20 1318)  . lactated ringers    . lactated ringers    . lactated ringers Stopped (10/11/20 1436)  . nitroGLYCERIN 40 mcg/min (10/12/20 0800)   PRN Meds: sodium chloride, dextrose, lactated ringers, metoprolol tartrate, midazolam, morphine injection, ondansetron (ZOFRAN) IV, oxyCODONE, sodium chloride flush, sodium chloride flush, traMADol   Vital Signs    Vitals:   10/12/20 0800 10/12/20 0815 10/12/20 0830 10/12/20 0845  BP: 134/82  138/73   Pulse: 84 87 89 88  Resp: (!) 21 17 17  (!) 26  Temp: 98.3 F (36.8 C)     TempSrc: Oral     SpO2: 98% 96% 99% 98%  Weight:      Height:        Intake/Output Summary (Last 24 hours) at 10/12/2020 0934 Last data filed at 10/12/2020 0800 Gross per 24 hour  Intake 1728.66 ml   Output 2645 ml  Net -916.34 ml   Filed Weights   10/10/20 0517 10/11/20 0500 10/12/20 0600  Weight: 113.5 kg 121.3 kg 119.2 kg    Telemetry    SR with frequent PACs - Personally Reviewed  ECG    SR, PACs inferior Q waves - Personally Reviewed  Physical Exam   GEN: No acute distress.   Neck: No JVD Cardiac: regular rhythm, normal rate, no murmurs. Sternotomy incision dressed. Respiratory: Clear to auscultation laterally. GI: Soft, nontender, non-distended  MS: trace edema; No deformity. Neuro:  Nonfocal  Psych: Normal affect   Labs    Chemistry Recent Labs  Lab 10/08/20 2154 10/09/20 0556 10/10/20 0313 10/11/20 0412 10/11/20 1523 10/12/20 0314  NA  --  139   < > 133* 133* 133*  K  --  3.8   < > 3.7 4.0 3.7  CL  --  97*   < > 103 103 99  CO2  --  32   < > 24 24 27   GLUCOSE  --  136*   < > 101* 156* 173*  BUN  --  17   < > 17 18 15  CREATININE  --  1.10   < > 0.83 0.87 0.83  CALCIUM  --  9.1   < > 7.4* 7.4* 7.6*  PROT 7.3 7.0  --   --   --   --   ALBUMIN 4.1 3.9  --   --   --   --   AST 26 23  --   --   --   --   ALT 49* 42  --   --   --   --   ALKPHOS 84 80  --   --   --   --   BILITOT 1.1 0.8  --   --   --   --   GFRNONAA  --  >60   < > >60 >60 >60  ANIONGAP  --  10   < > 6 6 7    < > = values in this interval not displayed.     Hematology Recent Labs  Lab 10/11/20 0412 10/11/20 1523 10/12/20 0314  WBC 10.5 11.0* 13.0*  RBC 3.80* 3.68* 4.13*  HGB 12.4* 12.1* 13.6  HCT 35.4* 34.9* 38.7*  MCV 93.2 94.8 93.7  MCH 32.6 32.9 32.9  MCHC 35.0 34.7 35.1  RDW 13.2 13.4 13.2  PLT 132* 129* 143*    Cardiac EnzymesNo results for input(s): TROPONINI in the last 168 hours. No results for input(s): TROPIPOC in the last 168 hours.   BNPNo results for input(s): BNP, PROBNP in the last 168 hours.   DDimer No results for input(s): DDIMER in the last 168 hours.   Radiology    DG Chest Port 1 View  Result Date: 10/12/2020 CLINICAL DATA:  Follow-up chest  tube placement EXAM: PORTABLE CHEST 1 VIEW COMPARISON:  10/11/2020 FINDINGS: Status post median sternotomy and CABG procedure. Right IJ Cordis remains in place status post removal of Swan-Ganz catheter. Left chest tubes remain in place. No pneumothorax identified. Tiny pleural effusions, unchanged. No airspace opacities. IMPRESSION: Stable exam. No pneumothorax with left chest tubes in place. Electronically Signed   By: 12/11/2020 M.D.   On: 10/12/2020 08:37   DG Chest Port 1 View  Result Date: 10/11/2020 CLINICAL DATA:  Chest tube.  Open-heart surgery. EXAM: PORTABLE CHEST 1 VIEW COMPARISON:  10/10/2020. FINDINGS: Interim extubation and removal of NG tube. Interim removal of mediastinal drainage catheter. Swan-Ganz catheter and left chest tubes in stable position. No pneumothorax. Prior CABG. Heart size stable. No pulmonary venous congestion. Bibasilar atelectasis. Tiny bilateral pleural effusions cannot be excluded. Prior cervical spine fusion. IMPRESSION: 1. Interim extubation and removal of NG tube. Interim removal of mediastinal drainage catheter. Swan-Ganz catheter left chest tube in stable position. No pneumothorax. 2.  Prior CABG.  Heart size stable. 3.  Mild bibasilar atelectasis.  Tiny bilateral pleural effusions. Electronically Signed   By: 12/10/2020  Register   On: 10/11/2020 06:41   DG Chest Port 1 View  Result Date: 10/10/2020 CLINICAL DATA:  Patient status post CABG. EXAM: PORTABLE CHEST 1 VIEW COMPARISON:  PA and lateral chest 10/08/2020. FINDINGS: ET tube is in good position at the level of the clavicular heads. Right IJ approach Swan Ganz catheter tip is in the right main pulmonary artery. Left chest tube and mediastinal drain are noted. Mild linear atelectasis left lower lung zone is noted. The lungs are otherwise clear. No pneumothorax. Heart size is normal. The visualized skeletal structures are unremarkable. IMPRESSION: Support tubes and lines project in good position. No pneumothorax or  acute disease. Electronically Signed  By: Drusilla Kanner M.D.   On: 10/10/2020 14:44    Cardiac Studies   Cath  Ost LAD to Prox LAD lesion is 40% stenosed.  Previously placed Prox Cx to Mid Cx drug eluting stent is widely patent.  RPAV lesion is 30% stenosed.  Prox RCA lesion is 40% stenosed.  Prox LAD to Mid LAD lesion is 100% stenosed.  Prox Cx lesion is 90% stenosed.  The left ventricular systolic function is normal.  LV end diastolic pressure is normal.  The left ventricular ejection fraction is 55-65% by visual estimate.   1. Severe 2 vessel obstructive/occlusive CAD. There is occlusion of the mid LAD immediately after the first diagonal. The LAD fills well by right to left collaterals. There is severe segmental disease in the proximal LCx prior to the previous stent 2. Normal LV function 3. Normal LVEDP  Plan: given multivessel CAD in a diabetic I would recommend consideration for CABG.    Patient Profile     70 y.o. male  CAD, HFpEF, HTN, AT, hyperlipidemia, T2DM and statin intolerance presenting with chest pain.  He was found to have multivessel CAD, and underwent CABG x2 LIMA to distal LAD, SVG to obtuse marginal.  Assessment & Plan   Principal Problem:   Unstable angina (HCC) Active Problems:   Hypertension   Hypokalemia   Type 2 diabetes mellitus (HCC)   CAD (coronary artery disease)   GERD (gastroesophageal reflux disease)   S/P CABG x 2   CAD s/p CABG HLD - asa 325 mg per [facility] - statin intolerant, on zetia. Recommend outpatient evaluation for PCSK9I - metoprolol 50 mg BID - normal EF  HTN -  BP remains elevated - per wife, at home takes amlodipine 10 mg, metoprolol succ 100 mg daily, olmesartan 40 mg daily, lasix 40 mg po bid, spironolactone 25 mg daily.  - consider resuming home dose olmesartan, amlodipine or spironolactone today. Renal function normal.   DM2 - on lantus preop, currently on SSI.       For questions or updates, please  contact [facility] Please consult [email or web address] for contact info under        Signed, Parke Poisson, MD  10/12/2020, 9:34 AM

## 2020-10-12 NOTE — TOC Benefit Eligibility Note (Signed)
Patient Product/process development scientist completed.    The patient is currently admitted and upon discharge could be taking Farxiga 10 mg.  The current 30 day co-pay is, $146.78.   The patient is insured through [employer] Medicare Part D    Russell Morrow, CPhT Pharmacy Patient Advocate Specialist [facility] Antimicrobial Stewardship Team Direct Number: [phone]  Fax: [phone]

## 2020-10-12 NOTE — Progress Notes (Addendum)
[address]       [place] [address]             [phone]      2 Days Post-Op Procedure(s) (LRB): CORONARY ARTERY BYPASS GRAFTING (CABG)X2. LEFT INTERNAL MAMMARY ARTERY. RIGHT ENDOSCOPIC SAPHENOUS VEIN HARVESTING. (N/A) TRANSESOPHAGEAL ECHOCARDIOGRAM (TEE) (N/A) INDOCYANINE GREEN FLUORESCENCE IMAGING (ICG) (N/A) Subjective: C/o indigestion, improved with maalox  Objective: Vital signs in last 24 hours: Temp:  [98.1 F (36.7 C)-98.78 F (37.1 C)] 98.3 F (36.8 C) (05/06 0800) Pulse Rate:  [69-116] 96 (05/06 1045) Cardiac Rhythm: Normal sinus rhythm (05/06 0800) Resp:  [12-33] 12 (05/06 1045) BP: (118-162)/(68-100) 147/80 (05/06 1030) SpO2:  [85 %-100 %] 97 % (05/06 1045) Arterial Line BP: (115-195)/(49-77) 171/65 (05/05 1600) Weight:  [119.2 kg] 119.2 kg (05/06 0600)  Hemodynamic parameters for last 24 hours: PAP: (187)/(179) 187/179  Intake/Output from previous day: 05/05 0701 - 05/06 0700 In: 1981.5 [P.O.:300; I.V.:1381.2; IV Piggyback:300.3] Out: 2965 [Urine:2215; Chest Tube:750] Intake/Output this shift: Total I/O In: 66 [I.V.:66] Out: 415 [Urine:415]  General appearance: alert, cooperative and no distress Heart: regular rate and rhythm and tachy Lungs: clear to auscultation bilaterally Abdomen: benign Extremities: minor edema Wound: dressings CDI  Lab Results: Recent Labs    10/11/20 1523 10/12/20 0314  WBC 11.0* 13.0*  HGB 12.1* 13.6  HCT 34.9* 38.7*  PLT 129* 143*   BMET:  Recent Labs    10/11/20 1523 10/12/20 0314  NA 133* 133*  K 4.0 3.7  CL 103 99  CO2 24 27  GLUCOSE 156* 173*  BUN 18 15  CREATININE 0.87 0.83  CALCIUM 7.4* 7.6*    PT/INR:  Recent Labs    10/10/20 1330  LABPROT 16.1*  INR 1.3*   ABG    Component Value Date/Time   PHART 7.394 10/10/2020 2238   HCO3 23.5 10/10/2020 2238   TCO2 25 10/10/2020 2238   ACIDBASEDEF 1.0 10/10/2020 2238   O2SAT 95.0 10/10/2020 2238   CBG (last 3)  Recent  Labs    10/12/20 0005 10/12/20 0404 10/12/20 0822  GLUCAP 182* 163* 192*    Meds Scheduled Meds: . acetaminophen  1,000 mg Oral Q6H   Or  . acetaminophen (TYLENOL) oral liquid 160 mg/5 mL  1,000 mg Per Tube Q6H  . aspirin EC  325 mg Oral Daily   Or  . aspirin  324 mg Per Tube Daily  . bisacodyl  10 mg Oral Daily   Or  . bisacodyl  10 mg Rectal Daily  . Chlorhexidine Gluconate Cloth  6 each Topical Daily  . docusate sodium  200 mg Oral Daily  . enoxaparin (LOVENOX) injection  40 mg Subcutaneous QHS  . ezetimibe  10 mg Oral Daily  . insulin aspart  0-24 Units Subcutaneous Q4H  . mouth rinse  15 mL Mouth Rinse BID  . metoprolol tartrate  50 mg Oral BID  . pantoprazole  40 mg Oral Daily  . potassium chloride  20 mEq Oral Q4H  . sodium chloride flush  10-40 mL Intracatheter Q12H  . sodium chloride flush  3 mL Intravenous Q12H   Continuous Infusions: . sodium chloride Stopped (10/11/20 1114)  . sodium chloride    . sodium chloride 10 mL/hr at 10/12/20 1000  .  ceFAZolin (ANCEF) IV Stopped (10/12/20 0555)  . insulin Stopped (10/11/20 1318)  . lactated ringers    . lactated ringers    . lactated ringers Stopped (10/11/20 1436)  .  nitroGLYCERIN 40 mcg/min (10/12/20 1000)   PRN Meds:.sodium chloride, alum & mag hydroxide-simeth, dextrose, lactated ringers, metoprolol tartrate, midazolam, morphine injection, ondansetron (ZOFRAN) IV, oxyCODONE, sodium chloride flush, sodium chloride flush, traMADol  Xrays DG Chest Port 1 View  Result Date: 10/12/2020 CLINICAL DATA:  Follow-up chest tube placement EXAM: PORTABLE CHEST 1 VIEW COMPARISON:  10/11/2020 FINDINGS: Status post median sternotomy and CABG procedure. Right IJ Cordis remains in place status post removal of Swan-Ganz catheter. Left chest tubes remain in place. No pneumothorax identified. Tiny pleural effusions, unchanged. No airspace opacities. IMPRESSION: Stable exam. No pneumothorax with left chest tubes in place.  Electronically Signed   By: Signa Kell M.D.   On: 10/12/2020 08:37   DG Chest Port 1 View  Result Date: 10/11/2020 CLINICAL DATA:  Chest tube.  Open-heart surgery. EXAM: PORTABLE CHEST 1 VIEW COMPARISON:  10/10/2020. FINDINGS: Interim extubation and removal of NG tube. Interim removal of mediastinal drainage catheter. Swan-Ganz catheter and left chest tubes in stable position. No pneumothorax. Prior CABG. Heart size stable. No pulmonary venous congestion. Bibasilar atelectasis. Tiny bilateral pleural effusions cannot be excluded. Prior cervical spine fusion. IMPRESSION: 1. Interim extubation and removal of NG tube. Interim removal of mediastinal drainage catheter. Swan-Ganz catheter left chest tube in stable position. No pneumothorax. 2.  Prior CABG.  Heart size stable. 3.  Mild bibasilar atelectasis.  Tiny bilateral pleural effusions. Electronically Signed   By: Maisie Fus  Register   On: 10/11/2020 06:41   DG Chest Port 1 View  Result Date: 10/10/2020 CLINICAL DATA:  Patient status post CABG. EXAM: PORTABLE CHEST 1 VIEW COMPARISON:  PA and lateral chest 10/08/2020. FINDINGS: ET tube is in good position at the level of the clavicular heads. Right IJ approach Swan Ganz catheter tip is in the right main pulmonary artery. Left chest tube and mediastinal drain are noted. Mild linear atelectasis left lower lung zone is noted. The lungs are otherwise clear. No pneumothorax. Heart size is normal. The visualized skeletal structures are unremarkable. IMPRESSION: Support tubes and lines project in good position. No pneumothorax or acute disease. Electronically Signed   By: Drusilla Kanner M.D.   On: 10/10/2020 14:44    Assessment/Plan: S/P Procedure(s) (LRB): CORONARY ARTERY BYPASS GRAFTING (CABG)X2. LEFT INTERNAL MAMMARY ARTERY. RIGHT ENDOSCOPIC SAPHENOUS VEIN HARVESTING. (N/A) TRANSESOPHAGEAL ECHOCARDIOGRAM (TEE) (N/A) INDOCYANINE GREEN FLUORESCENCE IMAGING (ICG) (N/A)   1 afeb. sBP 110's to 160's. Sinus  rhythm/tachy, metoprolol increased- weaning nitro off 2 sats good on 3 liters 3 minor anemia 4 minor leukocytosis, follow trend 5 thrombocytopenia trend improved 6 BS adeq control, transition to home insulin and trulicity over time- will add levemir 7 good UOP. Cont to diurese, normal renal fxn 8 routine pulm toilet/rehab 10 750cc CT drainage yesterday, 200 overnight- cont for now- serosang 11 routine pulm toilet/rehab  LOS: 3 days    Rowe Clack PA-C Pager [phone] 10/12/2020   Agree with above Doing well Will wean nitro Will transfer to floor once off nitro  Russell Morrow O Aleathia Purdy

## 2020-10-13 ENCOUNTER — Inpatient Hospital Stay (HOSPITAL_COMMUNITY): Payer: Medicare Other

## 2020-10-13 LAB — BASIC METABOLIC PANEL
Anion gap: 7 (ref 5–15)
BUN: 16 mg/dL (ref 8–23)
CO2: 27 mmol/L (ref 22–32)
Calcium: 7.8 mg/dL — ABNORMAL LOW (ref 8.9–10.3)
Chloride: 98 mmol/L (ref 98–111)
Creatinine, Ser: 0.84 mg/dL (ref 0.61–1.24)
GFR, Estimated: >60 mL/min (ref >60)
Glucose, Bld: 125 mg/dL — ABNORMAL HIGH (ref 70–99)
Potassium: 4 mmol/L (ref 3.5–5.1)
Sodium: 132 mmol/L — ABNORMAL LOW (ref 135–145)

## 2020-10-13 LAB — GLUCOSE, CAPILLARY
Glucose-Capillary: 133 mg/dL — ABNORMAL HIGH (ref 70–99)
Glucose-Capillary: 161 mg/dL — ABNORMAL HIGH (ref 70–99)
Glucose-Capillary: 171 mg/dL — ABNORMAL HIGH (ref 70–99)
Glucose-Capillary: 164 mg/dL — ABNORMAL HIGH (ref 70–99)
Glucose-Capillary: 168 mg/dL — ABNORMAL HIGH (ref 70–99)

## 2020-10-13 LAB — CBC
HCT: 39.3 % (ref 39.0–52.0)
Hemoglobin: 13.8 g/dL (ref 13.0–17.0)
MCH: 32.9 pg (ref 26.0–34.0)
MCHC: 35.1 g/dL (ref 30.0–36.0)
MCV: 93.8 fL (ref 80.0–100.0)
Platelets: 162 10*3/uL (ref 150–400)
RBC: 4.19 MIL/uL — ABNORMAL LOW (ref 4.22–5.81)
RDW: 13.3 % (ref 11.5–15.5)
WBC: 13.8 10*3/uL — ABNORMAL HIGH (ref 4.0–10.5)
nRBC: 0 % (ref 0.0–0.2)

## 2020-10-13 MED ORDER — FUROSEMIDE 40 MG PO TABS
40.0000 mg | ORAL_TABLET | Freq: Two times a day (BID) | ORAL | Status: DC
  Administered 2020-10-13 – 2020-10-15 (×4): 40 mg via ORAL
  Filled 2020-10-13 (×4): qty 1

## 2020-10-13 MED ORDER — INSULIN ASPART 100 UNIT/ML IJ SOLN
0.0000 [IU] | Freq: Three times a day (TID) | INTRAMUSCULAR | Status: DC
  Administered 2020-10-13 (×3): 4 [IU] via SUBCUTANEOUS
  Administered 2020-10-14: 2 [IU] via SUBCUTANEOUS
  Administered 2020-10-14: 8 [IU] via SUBCUTANEOUS
  Administered 2020-10-14: 4 [IU] via SUBCUTANEOUS
  Administered 2020-10-14: 8 [IU] via SUBCUTANEOUS
  Administered 2020-10-15: 2 [IU] via SUBCUTANEOUS

## 2020-10-13 MED ORDER — ~~LOC~~ CARDIAC SURGERY, PATIENT & FAMILY EDUCATION: Freq: Once | Status: AC

## 2020-10-13 MED ORDER — POTASSIUM CHLORIDE CRYS ER 20 MEQ PO TBCR
20.0000 meq | EXTENDED_RELEASE_TABLET | Freq: Two times a day (BID) | ORAL | Status: DC
  Administered 2020-10-13 – 2020-10-15 (×5): 20 meq via ORAL
  Filled 2020-10-13 (×5): qty 1

## 2020-10-13 NOTE — Progress Notes (Signed)
      [address]       [place] [address]             [phone]        CARDIOTHORACIC SURGERY PROGRESS NOTE   R3 Days Post-Op Procedure(s) (LRB): CORONARY ARTERY BYPASS GRAFTING (CABG)X2. LEFT INTERNAL MAMMARY ARTERY. RIGHT ENDOSCOPIC SAPHENOUS VEIN HARVESTING. (N/A) TRANSESOPHAGEAL ECHOCARDIOGRAM (TEE) (N/A) INDOCYANINE GREEN FLUORESCENCE IMAGING (ICG) (N/A)  Subjective: No complaints.  Feeling better  Objective: Vital signs: BP Readings from Last 1 Encounters:  10/13/20 103/60   Pulse Readings from Last 1 Encounters:  10/13/20 100   Resp Readings from Last 1 Encounters:  10/13/20 (!) 22   Temp Readings from Last 1 Encounters:  10/13/20 98.3 F (36.8 C) (Oral)    Hemodynamics:    Physical Exam:  Rhythm:   sinus  Breath sounds: clear  Heart sounds:  RRR  Incisions:  Dressing dry, intact  Abdomen:  Soft, non-distended, non-tender  Extremities:  Warm, well-perfused  Chest tubes:  Very low volume thin serosanguinous output, no air leak    Intake/Output from previous day: 05/06 0701 - 05/07 0700 In: 334.6 [I.V.:234.6; IV Piggyback:100] Out: 2710 [Urine:2590; Chest Tube:120] Intake/Output this shift: Total I/O In: 240 [P.O.:240] Out: 175 [Urine:125; Chest Tube:50]  Lab Results:  CBC: Recent Labs    10/12/20 0314 10/13/20 0439  WBC 13.0* 13.8*  HGB 13.6 13.8  HCT 38.7* 39.3  PLT 143* 162    BMET:  Recent Labs    10/12/20 0314 10/13/20 0439  NA 133* 132*  K 3.7 4.0  CL 99 98  CO2 27 27  GLUCOSE 173* 125*  BUN 15 16  CREATININE 0.83 0.84  CALCIUM 7.6* 7.8*     PT/INR:   Recent Labs    10/10/20 1330  LABPROT 16.1*  INR 1.3*    CBG (last 3)  Recent Labs    10/12/20 2317 10/13/20 0339 10/13/20 0747  GLUCAP 167* 133* 161*    ABG    Component Value Date/Time   PHART 7.394 10/10/2020 2238   PCO2ART 38.5 10/10/2020 2238   PO2ART 77 (L) 10/10/2020 2238   HCO3 23.5 10/10/2020 2238   TCO2 25 10/10/2020 2238    ACIDBASEDEF 1.0 10/10/2020 2238   O2SAT 95.0 10/10/2020 2238    CXR: PORTABLE CHEST 1 VIEW  COMPARISON:  Chest radiograph from one day prior.  FINDINGS: Stable left chest tube terminating over mid to lower peripheral left pleural space. Stable right internal jugular central venous sheath terminating in high right mediastinum near the thoracic inlet. Partially visualized surgical hardware from ACDF. Intact sternotomy wires. CABG clips overlie the left mediastinum. Stable cardiomediastinal silhouette with mild cardiomegaly. No pneumothorax. No pleural effusion. Mild right lung base atelectasis. No overt pulmonary edema. No consolidative airspace disease.  IMPRESSION: 1. No pneumothorax. Stable left chest tube. 2. Stable mild cardiomegaly without overt pulmonary edema. 3. Mild right lung base atelectasis.   Electronically Signed   By: Delbert Phenix M.D.   On: 10/13/2020 08:33  Assessment/Plan: S/P Procedure(s) (LRB): CORONARY ARTERY BYPASS GRAFTING (CABG)X2. LEFT INTERNAL MAMMARY ARTERY. RIGHT ENDOSCOPIC SAPHENOUS VEIN HARVESTING. (N/A) TRANSESOPHAGEAL ECHOCARDIOGRAM (TEE) (N/A) INDOCYANINE GREEN FLUORESCENCE IMAGING (ICG) (N/A)  Doing well POD3 Mobilize Diuresis D/C tubes and Foley Transfer 4E  Purcell Nails, MD 10/13/2020 10:15 AM

## 2020-10-13 NOTE — Progress Notes (Signed)
Pt arrived to rm 1 from [facility]. Initiated tele. CHG wipe performed. Oriented pt to the unit. VSS. Call bell within reach.   Lawson Radar, RN

## 2020-10-14 ENCOUNTER — Inpatient Hospital Stay (HOSPITAL_COMMUNITY): Payer: Medicare Other

## 2020-10-14 LAB — CBC
HCT: 39 % (ref 39.0–52.0)
Hemoglobin: 13.8 g/dL (ref 13.0–17.0)
MCH: 32.9 pg (ref 26.0–34.0)
MCHC: 35.4 g/dL (ref 30.0–36.0)
MCV: 92.9 fL (ref 80.0–100.0)
Platelets: 205 10*3/uL (ref 150–400)
RBC: 4.2 MIL/uL — ABNORMAL LOW (ref 4.22–5.81)
RDW: 13.5 % (ref 11.5–15.5)
WBC: 11.8 10*3/uL — ABNORMAL HIGH (ref 4.0–10.5)
nRBC: 0 % (ref 0.0–0.2)

## 2020-10-14 LAB — BASIC METABOLIC PANEL
Anion gap: 6 (ref 5–15)
BUN: 16 mg/dL (ref 8–23)
CO2: 30 mmol/L (ref 22–32)
Calcium: 8.1 mg/dL — ABNORMAL LOW (ref 8.9–10.3)
Chloride: 98 mmol/L (ref 98–111)
Creatinine, Ser: 0.93 mg/dL (ref 0.61–1.24)
GFR, Estimated: >60 mL/min (ref >60)
Glucose, Bld: 122 mg/dL — ABNORMAL HIGH (ref 70–99)
Potassium: 4 mmol/L (ref 3.5–5.1)
Sodium: 134 mmol/L — ABNORMAL LOW (ref 135–145)

## 2020-10-14 LAB — GLUCOSE, CAPILLARY
Glucose-Capillary: 144 mg/dL — ABNORMAL HIGH (ref 70–99)
Glucose-Capillary: 193 mg/dL — ABNORMAL HIGH (ref 70–99)
Glucose-Capillary: 207 mg/dL — ABNORMAL HIGH (ref 70–99)
Glucose-Capillary: 205 mg/dL — ABNORMAL HIGH (ref 70–99)

## 2020-10-14 MED ORDER — SPIRONOLACTONE 25 MG PO TABS
25.0000 mg | ORAL_TABLET | Freq: Every day | ORAL | Status: DC
  Administered 2020-10-14 – 2020-10-15 (×2): 25 mg via ORAL
  Filled 2020-10-14 (×2): qty 1

## 2020-10-14 MED ORDER — AMLODIPINE BESYLATE 10 MG PO TABS
10.0000 mg | ORAL_TABLET | Freq: Every day | ORAL | Status: DC
  Administered 2020-10-14 – 2020-10-15 (×2): 10 mg via ORAL
  Filled 2020-10-14 (×2): qty 1

## 2020-10-14 NOTE — Progress Notes (Signed)
Patient EPW pulled per protocol and as ordered. All ends intact. Patient reminded to lie supine approximately one hour. bp 123/69 heart rate 74. Will monitor patient. Nicklos Gaxiola, Randall An RN

## 2020-10-14 NOTE — Progress Notes (Signed)
Patient ambulated in hallway with nursing staff. 940 feet. Gait steady. Back in chair Will monitor patient. Wisdom Seybold, Randall An RN

## 2020-10-14 NOTE — Progress Notes (Signed)
Mobility Specialist: Progress Note   10/14/20 1755  Mobility  Activity Ambulated in hall  Level of Assistance Independent  Assistive Device None  Distance Ambulated (ft) 1000 ft  Mobility Response Tolerated well  Mobility performed by Mobility specialist  Bed Position Chair  $Mobility charge 1 Mobility   Pre-Mobility: 107 HR Post-Mobility: 111 HR  Pt c/o pain in his abdomen during ambulation, no rating given. Pt otherwise asx and to chair after walk.   [facility] Lia Vigilante Mobility Specialist Mobility Specialist Phone: [phone]

## 2020-10-14 NOTE — Progress Notes (Addendum)
      [address]       [place] [address]             [phone]      4 Days Post-Op Procedure(s) (LRB): CORONARY ARTERY BYPASS GRAFTING (CABG)X2. LEFT INTERNAL MAMMARY ARTERY. RIGHT ENDOSCOPIC SAPHENOUS VEIN HARVESTING. (N/A) TRANSESOPHAGEAL ECHOCARDIOGRAM (TEE) (N/A) INDOCYANINE GREEN FLUORESCENCE IMAGING (ICG) (N/A)   Subjective:  Patient doing okay.  Has no specific complaints.   Was hoping to go home today.  + ambulation  + BM  Objective: Vital signs in last 24 hours: Temp:  [97.8 F (36.6 C)-98.9 F (37.2 C)] 98.4 F (36.9 C) (05/08 0744) Pulse Rate:  [69-109] 109 (05/08 0744) Cardiac Rhythm: Normal sinus rhythm (05/08 0700) Resp:  [13-28] 17 (05/08 0744) BP: (103-152)/(60-96) 147/90 (05/08 0744) SpO2:  [92 %-99 %] 99 % (05/08 0744) Weight:  [263 kg] 117 kg (05/08 0355)  Intake/Output from previous day: 05/07 0701 - 05/08 0700 In: 960 [P.O.:960] Out: 475 [Urine:375; Chest Tube:100] Intake/Output this shift: Total I/O In: -  Out: 1 [Stool:1]  General appearance: alert, cooperative and no distress Heart: regular rate and rhythm Lungs: clear to auscultation bilaterally Abdomen: soft, non-tender; bowel sounds normal; no masses,  no organomegaly Extremities: edema trace Wound: clean and dry  Lab Results: Recent Labs    10/13/20 0439 10/14/20 0026  WBC 13.8* 11.8*  HGB 13.8 13.8  HCT 39.3 39.0  PLT 162 205   BMET:  Recent Labs    10/13/20 0439 10/14/20 0026  NA 132* 134*  K 4.0 4.0  CL 98 98  CO2 27 30  GLUCOSE 125* 122*  BUN 16 16  CREATININE 0.84 0.93  CALCIUM 7.8* 8.1*    PT/INR: No results for input(s): LABPROT, INR in the last 72 hours. ABG    Component Value Date/Time   PHART 7.394 10/10/2020 2238   HCO3 23.5 10/10/2020 2238   TCO2 25 10/10/2020 2238   ACIDBASEDEF 1.0 10/10/2020 2238   O2SAT 95.0 10/10/2020 2238   CBG (last 3)  Recent Labs    10/13/20 1540 10/13/20 2035 10/14/20 0647  GLUCAP 164* 168* 144*     Assessment/Plan: S/P Procedure(s) (LRB): CORONARY ARTERY BYPASS GRAFTING (CABG)X2. LEFT INTERNAL MAMMARY ARTERY. RIGHT ENDOSCOPIC SAPHENOUS VEIN HARVESTING. (N/A) TRANSESOPHAGEAL ECHOCARDIOGRAM (TEE) (N/A) INDOCYANINE GREEN FLUORESCENCE IMAGING (ICG) (N/A)  1. CV- Sinus Tach, mild with PVCs, HTN- on home dose of Lopressor, Avapro, will resume Norvasc, d/c EPW today 2. Pulm- no acute issues, CXR w/o pneumothorax, + atelectasis 3. Renal- creatinine WNL, weight is trending down, continue Lasix BID which is home regimen, will resume Spironolactone 4. CBGs controlled, continue insulin regimen, SSIP 5. Dispo- patient stable, will add Norvasc for HTN, d/c EPW, continue BID lasix, add Spironolactone which is home regimen, will plan to d/c in AM if patient remains stable   LOS: 5 days    Lowella Dandy, PA-C 10/14/2020   I have seen and examined the patient and agree with the assessment and plan as outlined.  Tentatively plan D/C home tomorrow  Purcell Nails, MD 10/14/2020 10:28 AM

## 2020-10-15 LAB — BASIC METABOLIC PANEL
Anion gap: 6 (ref 5–15)
BUN: 17 mg/dL (ref 8–23)
CO2: 29 mmol/L (ref 22–32)
Calcium: 8.1 mg/dL — ABNORMAL LOW (ref 8.9–10.3)
Chloride: 99 mmol/L (ref 98–111)
Creatinine, Ser: 0.94 mg/dL (ref 0.61–1.24)
GFR, Estimated: >60 mL/min (ref >60)
Glucose, Bld: 148 mg/dL — ABNORMAL HIGH (ref 70–99)
Potassium: 3.6 mmol/L (ref 3.5–5.1)
Sodium: 134 mmol/L — ABNORMAL LOW (ref 135–145)

## 2020-10-15 LAB — GLUCOSE, CAPILLARY: Glucose-Capillary: 157 mg/dL — ABNORMAL HIGH (ref 70–99)

## 2020-10-15 MED ORDER — OXYCODONE HCL 5 MG PO TABS: 5.0000 mg | ORAL_TABLET | Freq: Four times a day (QID) | ORAL | 0 refills | Status: DC | PRN

## 2020-10-15 MED ORDER — CEPHALEXIN 500 MG PO CAPS
500.0000 mg | ORAL_CAPSULE | Freq: Three times a day (TID) | ORAL | Status: DC
  Administered 2020-10-15: 500 mg via ORAL
  Filled 2020-10-15: qty 1

## 2020-10-15 MED ORDER — OXYCODONE HCL 5 MG PO TABS: 5.0000 mg | ORAL_TABLET | Freq: Four times a day (QID) | ORAL | 0 refills | Status: AC | PRN

## 2020-10-15 MED ORDER — CEPHALEXIN 500 MG PO CAPS: 500.0000 mg | ORAL_CAPSULE | Freq: Three times a day (TID) | ORAL | 0 refills | Status: AC

## 2020-10-15 MED ORDER — FUROSEMIDE 40 MG PO TABS: 40.0000 mg | ORAL_TABLET | Freq: Every day | ORAL | 0 refills | Status: AC

## 2020-10-15 MED ORDER — POTASSIUM CHLORIDE CRYS ER 20 MEQ PO TBCR: 20.0000 meq | EXTENDED_RELEASE_TABLET | Freq: Two times a day (BID) | ORAL | 0 refills | Status: AC

## 2020-10-15 MED FILL — Sodium Bicarbonate IV Soln 8.4%: INTRAVENOUS | Qty: 50 | Status: AC

## 2020-10-15 MED FILL — Mannitol IV Soln 20%: INTRAVENOUS | Qty: 500 | Status: AC

## 2020-10-15 MED FILL — Potassium Chloride Inj 2 mEq/ML: INTRAVENOUS | Qty: 40 | Status: AC

## 2020-10-15 MED FILL — Heparin Sodium (Porcine) Inj 1000 Unit/ML: INTRAMUSCULAR | Qty: 30 | Status: AC

## 2020-10-15 MED FILL — Electrolyte-R (PH 7.4) Solution: INTRAVENOUS | Qty: 4000 | Status: AC

## 2020-10-15 MED FILL — Sodium Chloride IV Soln 0.9%: INTRAVENOUS | Qty: 2000 | Status: AC

## 2020-10-15 MED FILL — Magnesium Sulfate Inj 50%: INTRAMUSCULAR | Qty: 10 | Status: AC

## 2020-10-15 NOTE — Progress Notes (Signed)
[address]       [place] [address]             [phone]      5 Days Post-Op Procedure(s) (LRB): CORONARY ARTERY BYPASS GRAFTING (CABG)X2. LEFT INTERNAL MAMMARY ARTERY. RIGHT ENDOSCOPIC SAPHENOUS VEIN HARVESTING. (N/A) TRANSESOPHAGEAL ECHOCARDIOGRAM (TEE) (N/A) INDOCYANINE GREEN FLUORESCENCE IMAGING (ICG) (N/A) Subjective: Minor serosang drainage from sternal incis- just started  Objective: Vital signs in last 24 hours: Temp:  [98.4 F (36.9 C)-99.6 F (37.6 C)] 99.1 F (37.3 C) (05/09 0330) Pulse Rate:  [76-109] 93 (05/09 0330) Cardiac Rhythm: Normal sinus rhythm;Sinus tachycardia (05/08 1920) Resp:  [15-20] 20 (05/09 0330) BP: (109-159)/(65-92) 109/77 (05/09 0330) SpO2:  [94 %-99 %] 94 % (05/09 0330) Weight:  [114.3 kg] 114.3 kg (05/09 0330)  Hemodynamic parameters for last 24 hours:    Intake/Output from previous day: 05/08 0701 - 05/09 0700 In: 120 [P.O.:120] Out: 1 [Stool:1] Intake/Output this shift: Total I/O In: 120 [P.O.:120] Out: -   General appearance: alert, cooperative and no distress Heart: regular rate and rhythm Lungs: clear to auscultation bilaterally Abdomen: benign Extremities: trace edema Wound: incis healing well- minor sternal drainage, no purulence, no erethema  Lab Results: Recent Labs    10/13/20 0439 10/14/20 0026  WBC 13.8* 11.8*  HGB 13.8 13.8  HCT 39.3 39.0  PLT 162 205   BMET:  Recent Labs    10/14/20 0026 10/15/20 0238  NA 134* 134*  K 4.0 3.6  CL 98 99  CO2 30 29  GLUCOSE 122* 148*  BUN 16 17  CREATININE 0.93 0.94  CALCIUM 8.1* 8.1*    PT/INR: No results for input(s): LABPROT, INR in the last 72 hours. ABG    Component Value Date/Time   PHART 7.394 10/10/2020 2238   HCO3 23.5 10/10/2020 2238   TCO2 25 10/10/2020 2238   ACIDBASEDEF 1.0 10/10/2020 2238   O2SAT 95.0 10/10/2020 2238   CBG (last 3)  Recent Labs    10/14/20 1606 10/14/20 2102 10/15/20 0548  GLUCAP 207* 205* 157*     Meds Scheduled Meds: . acetaminophen  1,000 mg Oral Q6H  . amLODipine  10 mg Oral Daily  . aspirin EC  325 mg Oral Daily  . bisacodyl  10 mg Oral Daily   Or  . bisacodyl  10 mg Rectal Daily  . Chlorhexidine Gluconate Cloth  6 each Topical Daily  . docusate sodium  200 mg Oral Daily  . enoxaparin (LOVENOX) injection  40 mg Subcutaneous QHS  . ezetimibe  10 mg Oral Daily  . furosemide  40 mg Oral BID  . insulin aspart  0-24 Units Subcutaneous TID AC & HS  . insulin detemir  10 Units Subcutaneous BID  . irbesartan  300 mg Oral Daily  . metoprolol tartrate  50 mg Oral BID  . pantoprazole  40 mg Oral Daily  . potassium chloride  20 mEq Oral BID  . sodium chloride flush  10-40 mL Intracatheter Q12H  . sodium chloride flush  3 mL Intravenous Q12H  . spironolactone  25 mg Oral Daily   Continuous Infusions: . sodium chloride    . lactated ringers     PRN Meds:.alum & mag hydroxide-simeth, dextrose, metoprolol tartrate, ondansetron (ZOFRAN) IV, oxyCODONE, sodium chloride flush, sodium chloride flush, traMADol  Xrays DG Chest 2 View  Result Date: 10/14/2020 CLINICAL DATA:  Atelectasis. EXAM: CHEST - 2 VIEW COMPARISON:  Chest x-ray 10/13/2020, CT chest 10/08/2020 FINDINGS: Interval  removal of a right internal jugular venous Cordis. Interval removal of a left chest tube. The heart size and mediastinal contours are unchanged. Cardiac surgical changes overlie the mediastinum. Pacing wires are noted. Improved right base atelectasis. No focal consolidation. No pulmonary edema. No pleural effusion. No pneumothorax. No acute osseous abnormality. IMPRESSION: No active cardiopulmonary disease. Interval removal of left chest tube and right internal jugular central venous catheter Cordis. Electronically Signed   By: Tish Frederickson M.D.   On: 10/14/2020 05:22    Assessment/Plan: S/P Procedure(s) (LRB): CORONARY ARTERY BYPASS GRAFTING (CABG)X2. LEFT INTERNAL MAMMARY ARTERY. RIGHT ENDOSCOPIC  SAPHENOUS VEIN HARVESTING. (N/A) TRANSESOPHAGEAL ECHOCARDIOGRAM (TEE) (N/A) INDOCYANINE GREEN FLUORESCENCE IMAGING (ICG) (N/A)   1 Tmax 99.6, sBP 100's -150's, SR/ST 2 sats good on RA 3 BS 150's- low 200's 4 normal renal fxn, weight about preop, good UOP 5 prob fatty necrosis from sternal incis - he is aware what to watch for in terms of infection- will add keflex 8 stable for d/c    LOS: 6 days    Rowe Clack PA-C Pager [phone] 10/15/2020

## 2020-10-15 NOTE — Progress Notes (Signed)
CARDIAC REHAB PHASE I   D/c education completed with pt and wife. Pt educated on importance of site care and monitoring incisions daily. Encouraged continued IS use, walks, and sternal precautions. Pt given in-the-tube sheet along with heart healthy and diabetic diets. Reviewed restrictions and exercise guidelines. Will refer to CRP II [place]. Pt denies DME needs or questions at this time.  [phone] Reynold Bowen, RN BSN 10/15/2020 9:29 AM

## 2020-10-15 NOTE — Plan of Care (Signed)
  Problem: Education: Goal: Knowledge of General Education information will improve Description: Including pain rating scale, medication(s)/side effects and non-pharmacologic comfort measures Outcome: Adequate for Discharge   

## 2020-10-15 NOTE — Care Management Important Message (Signed)
Important Message  Patient Details  Name: Russell Morrow MRN: [id number] Date of Birth: 1950/12/03   Medicare Important Message Given:  Yes     [name] 10/15/2020, 10:21 AM

## 2020-10-15 NOTE — Progress Notes (Signed)
Discharge instructions (including medications) discussed with and copy provided to patient/caregiver 

## 2020-10-16 ENCOUNTER — Other Ambulatory Visit: Payer: Self-pay | Admitting: *Deleted

## 2020-10-16 ENCOUNTER — Other Ambulatory Visit: Payer: Self-pay

## 2020-10-24 ENCOUNTER — Other Ambulatory Visit: Payer: Self-pay | Admitting: Cardiothoracic Surgery

## 2020-10-24 DIAGNOSIS — Z951 Presence of aortocoronary bypass graft: Secondary | ICD-10-CM

## 2020-10-25 ENCOUNTER — Ambulatory Visit
Admission: RE | Admit: 2020-10-25 | Discharge: 2020-10-25 | Disposition: A | Discharge status: Home / Self Care | Payer: Medicare Other | Source: Ambulatory Visit | Attending: Cardiothoracic Surgery | Admitting: Cardiothoracic Surgery

## 2020-10-25 ENCOUNTER — Encounter: Payer: Self-pay | Admitting: Cardiothoracic Surgery

## 2020-10-25 ENCOUNTER — Ambulatory Visit (INDEPENDENT_AMBULATORY_CARE_PROVIDER_SITE_OTHER): Payer: Self-pay | Admitting: Cardiothoracic Surgery

## 2020-10-25 ENCOUNTER — Other Ambulatory Visit: Payer: Self-pay

## 2020-10-25 VITALS — BP 124/70 | HR 99 | Resp 20 | Ht 73.0 in | Wt 243.0 lb

## 2020-10-25 DIAGNOSIS — Z951 Presence of aortocoronary bypass graft: Secondary | ICD-10-CM

## 2020-10-25 DIAGNOSIS — J90 Pleural effusion, not elsewhere classified: Secondary | ICD-10-CM | POA: Diagnosis not present

## 2020-10-25 NOTE — Progress Notes (Signed)
[address]       [place] [address]             [phone]     CARDIOTHORACIC SURGERY OFFICE NOTE  Referring Provider is Branch, Dorothe Pea, MD Primary Cardiologist is Dina Rich, MD PCP is Benita Stabile, MD   HPI:  70 year old man is status post CABG 2 weeks ago.  He was discharged several days after surgery and now presents for his initial outpatient visit.  Has been doing well.  He notes only a brief episode of tacky arrhythmia which he attributes to his underlying PAT.  He also notes occasional midsternal click but no pain.  He denies angina or shortness of breath; he continues to take Lasix as he did prior to surgery.   Current Outpatient Medications  Medication Sig Dispense Refill  . acetaminophen (TYLENOL) 500 MG tablet Take 1,000 mg by mouth every 6 (six) hours as needed for moderate pain.     Marland Kitchen aspirin EC 81 MG EC tablet Take 1 tablet (81 mg total) by mouth daily.    . BD PEN NEEDLE NANO U/F 32G X 4 MM MISC Inject into the skin.    Marland Kitchen diclofenac Sodium (VOLTAREN) 1 % GEL Apply 2 g topically 4 (four) times daily as needed (pain).    Marland Kitchen dicyclomine (BENTYL) 10 MG capsule Take 10 mg by mouth 4 (four) times daily as needed for spasms.     Marland Kitchen esomeprazole (NEXIUM) 20 MG capsule Take 20 mg by mouth daily as needed (acid reflux).    Marland Kitchen ezetimibe (ZETIA) 10 MG tablet Take 10 mg by mouth daily.    . FEROSUL 325 (65 Fe) MG tablet Take 325 mg by mouth 2 (two) times daily.     . furosemide (LASIX) 40 MG tablet Take 1 tablet (40 mg total) by mouth daily. 7 tablet 0  . HUMALOG KWIKPEN 100 UNIT/ML KwikPen Inject 10-16 Units into the skin with breakfast, with lunch, and with evening meal. Sliding Scale:  CBG 140-180 = 10 units, 181-220 = 12 units, 221-260 = 14 units, 261-300 = 16 units , call MD over 300    . LANTUS SOLOSTAR 100 UNIT/ML Solostar Pen Inject 35 Units into the skin 2 (two) times daily. (Patient taking differently: Inject 40 Units into the skin 2 (two)  times daily.) 15 mL 11  . metoprolol succinate (TOPROL-XL) 100 MG 24 hr tablet Take 100 mg by mouth daily.    Marland Kitchen olmesartan (BENICAR) 40 MG tablet Take 1 tablet (40 mg total) by mouth daily. 90 tablet 3  . potassium chloride SA (KLOR-CON) 20 MEQ tablet Take 1 tablet (20 mEq total) by mouth 2 (two) times daily. 7 tablet 0  . pregabalin (LYRICA) 100 MG capsule Take 100 mg by mouth at bedtime.    Marland Kitchen rOPINIRole (REQUIP) 1 MG tablet Take 2 mg by mouth at bedtime.    . tadalafil (CIALIS) 10 MG tablet Take 10 mg by mouth daily as needed for erectile dysfunction.     . temazepam (RESTORIL) 15 MG capsule Take 15 mg by mouth at bedtime.    . TRULICITY 0.75 MG/0.5ML SOPN Inject 1.5 mg into the skin once a week. Friday    . amLODipine (NORVASC) 10 MG tablet Take 1 tablet (10 mg total) by mouth daily. 90 tablet 3  . spironolactone (ALDACTONE) 25 MG tablet Take 1 tablet (25 mg total) by mouth daily. 90 tablet 3   No current facility-administered  medications for this visit.      Physical Exam:   BP 124/70 (BP Location: Right Arm, Patient Position: Sitting, Cuff Size: Normal)   Pulse 99   Resp 20   Ht 6\' 1"  (1.854 m)   Wt 110.2 kg   SpO2 95% Comment: RA  BMI 32.06 kg/m   General:  Well-appearing no acute distress  Chest:   Clear to auscultation  CV:   Regular rate and rhythm  Incisions:  Healing well  Abdomen:  Soft nontender  Extremities:  Mild edema, right greater than left  Diagnostic Tests:  Chest x-ray demonstrates clear lung fields and stable mediastinal structures and hardware   Impression:  Doing well after CABG  Plan:  Follow-up as needed with thoracic surgery Follow-up with Dr. , [facility] cardiology Continue current medical regimen Okay to drive   I spent in excess of 15 minutes during the conduct of this office consultation and >50% of this time involved direct face-to-face encounter with the patient for counseling and/or coordination of their care.  Level 2                  10 minutes Level 3                 15 minutes Level 4                 25 minutes Level 5                 40 minutes  B.  Good Sam, MD 10/25/2020 1:59 PM

## 2020-10-30 DIAGNOSIS — E1165 Type 2 diabetes mellitus with hyperglycemia: Secondary | ICD-10-CM | POA: Diagnosis not present

## 2020-11-07 ENCOUNTER — Other Ambulatory Visit: Payer: Self-pay | Admitting: *Deleted

## 2020-11-07 ENCOUNTER — Ambulatory Visit: Payer: Medicare Other | Admitting: Family Medicine

## 2020-11-07 NOTE — Patient Outreach (Signed)
[facility]) Care Management  [facility] Care Manager  11/07/2020   Russell Morrow 1951-03-13 [id number]  Subjective: Telephone outreach to f/u on CAD, progress of recovery from CABG.  Encounter Medications:  Outpatient Encounter Medications as of 11/07/2020  Medication Sig Note  . acetaminophen (TYLENOL) 500 MG tablet Take 1,000 mg by mouth every 6 (six) hours as needed for moderate pain.    Marland Kitchen amLODipine (NORVASC) 10 MG tablet Take 1 tablet (10 mg total) by mouth daily.   Marland Kitchen aspirin EC 81 MG EC tablet Take 1 tablet (81 mg total) by mouth daily.   . BD PEN NEEDLE NANO U/F 32G X 4 MM MISC Inject into the skin.   Marland Kitchen diclofenac Sodium (VOLTAREN) 1 % GEL Apply 2 g topically 4 (four) times daily as needed (pain).   Marland Kitchen dicyclomine (BENTYL) 10 MG capsule Take 10 mg by mouth 4 (four) times daily as needed for spasms.    Marland Kitchen esomeprazole (NEXIUM) 20 MG capsule Take 20 mg by mouth daily as needed (acid reflux).   Marland Kitchen ezetimibe (ZETIA) 10 MG tablet Take 10 mg by mouth daily.   . FEROSUL 325 (65 Fe) MG tablet Take 325 mg by mouth 2 (two) times daily.    . furosemide (LASIX) 40 MG tablet Take 1 tablet (40 mg total) by mouth daily. 10/16/2020: Taking 40 mg bid.  Marland Kitchen HUMALOG KWIKPEN 100 UNIT/ML KwikPen Inject 10-16 Units into the skin with breakfast, with lunch, and with evening meal. Sliding Scale:  CBG 140-180 = 10 units, 181-220 = 12 units, 221-260 = 14 units, 261-300 = 16 units , call MD over 300   . LANTUS SOLOSTAR 100 UNIT/ML Solostar Pen Inject 35 Units into the skin 2 (two) times daily. (Patient taking differently: Inject 40 Units into the skin 2 (two) times daily.)   . metoprolol succinate (TOPROL-XL) 100 MG 24 hr tablet Take 100 mg by mouth daily.   Marland Kitchen olmesartan (BENICAR) 40 MG tablet Take 1 tablet (40 mg total) by mouth daily.   . potassium chloride SA (KLOR-CON) 20 MEQ tablet Take 1 tablet (20 mEq total) by mouth 2 (two) times daily.   . pregabalin (LYRICA) 100 MG capsule Take 100 mg by mouth at  bedtime.   Marland Kitchen rOPINIRole (REQUIP) 1 MG tablet Take 2 mg by mouth at bedtime. 10/16/2020: Taking 3 tabs  . spironolactone (ALDACTONE) 25 MG tablet Take 1 tablet (25 mg total) by mouth daily.   . tadalafil (CIALIS) 10 MG tablet Take 10 mg by mouth daily as needed for erectile dysfunction.    . temazepam (RESTORIL) 15 MG capsule Take 15 mg by mouth at bedtime.   . TRULICITY 0.75 MG/0.5ML SOPN Inject 1.5 mg into the skin once a week. Friday    No facility-administered encounter medications on file as of 11/07/2020.    Functional Status:  In your present state of health, do you have any difficulty performing the following activities: 10/09/2020 01/31/2020  Hearing? N -  Vision? N -  Difficulty concentrating or making decisions? N -  Walking or climbing stairs? N -  Dressing or bathing? N -  Doing errands, shopping? N N  Some recent data might be hidden    Fall/Depression Screening: Fall Risk  01/24/2020 12/21/2019  Falls in the past year? 1 1  Number falls in past yr: 1 1  Injury with Fall? - 1  Risk for fall due to : - History of fall(s)  Risk for fall due to: Comment -  Electrolyte imbalance.  Follow up - Falls evaluation completed   PHQ 2/9 Scores 12/21/2019  PHQ - 2 Score 0    Assessment: CAD, post CABG X 2  Goals Addressed            This Visit's Progress   . Improve My Heart Health-Coronary Artery Disease over the next 90 days by attending cardiopulmoniary rehab.       Timeframe:  Long-Range Goal Priority:  High Start Date:    10/16/20                         Expected End Date:      01/21/21                 Follow Up Date 11/21/20    - be open to making changes - I can manage, know and watch for signs of a heart attack    Why is this important?    Lifestyle changes are key to improving the blood flow to your heart. Think about the things you can change and set a goal to live healthy.   Remember, when the blood vessels to your heart start to get clogged you may not have any  symptoms.   Over time, they can get worse.   Don't ignore the signs, like chest pain, and get help right away.     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 Prior to this pt has made lifestyle changes in his diet, glucose control and wt loss. Encouraged today to follow his discharge plan. Call MD if needed. Talk with NP weekly over next 4 weeks. Praised for his past accomplishments. Will continue working on continued lifestyle management for CAD and DM. 11/07/20 Pt reports he just tested + for COVID. He has had some chest discomfort, cough, fever. His wife is also +. He does not feel like this is setting him back at this time. He was to see the cardiologist today but it is now rescheduled for the 13th. He may be starting cardiac rehab after that visit. He admits he is just doing OK with trying to follow a heart healthy diet. He is moving around as much as he can tolerate.       Plan:  We agreed to talk again in one week.            Provided information for upcoming transition in care management service provided by his primary care offfice. Follow-up:  Patient agrees to Care Plan and Follow-up.   [name]. [name], MSN, [facility] Gerontological Nurse Practitioner [facility] Care Management [phone]

## 2020-11-08 DIAGNOSIS — M1990 Unspecified osteoarthritis, unspecified site: Secondary | ICD-10-CM | POA: Diagnosis not present

## 2020-11-08 DIAGNOSIS — E1165 Type 2 diabetes mellitus with hyperglycemia: Secondary | ICD-10-CM | POA: Diagnosis not present

## 2020-11-14 ENCOUNTER — Other Ambulatory Visit: Payer: Medicare Other | Admitting: *Deleted

## 2020-11-14 ENCOUNTER — Telehealth: Payer: Self-pay | Admitting: Cardiology

## 2020-11-14 ENCOUNTER — Other Ambulatory Visit: Payer: Self-pay

## 2020-11-14 ENCOUNTER — Ambulatory Visit: Payer: Self-pay | Admitting: *Deleted

## 2020-11-14 NOTE — Telephone Encounter (Signed)
Pt is having low BP readings, almost passed out when standing up.  Would like to know if he could adjust some meds before he comes in for his apt next week.   [phone]

## 2020-11-14 NOTE — Patient Outreach (Signed)
[facility]) Care Management  [facility] Care Manager  11/14/2020   Russell Morrow Jan 24, 1951 [id number]  Subjective: Weekly transition of care call to f/u on MI and COVID recovery.  Encounter Medications:  Outpatient Encounter Medications as of 11/14/2020  Medication Sig Note  . acetaminophen (TYLENOL) 500 MG tablet Take 1,000 mg by mouth every 6 (six) hours as needed for moderate pain.    Marland Kitchen amLODipine (NORVASC) 10 MG tablet Take 1 tablet (10 mg total) by mouth daily.   Marland Kitchen aspirin EC 81 MG EC tablet Take 1 tablet (81 mg total) by mouth daily.   . BD PEN NEEDLE NANO U/F 32G X 4 MM MISC Inject into the skin.   Marland Kitchen diclofenac Sodium (VOLTAREN) 1 % GEL Apply 2 g topically 4 (four) times daily as needed (pain).   Marland Kitchen dicyclomine (BENTYL) 10 MG capsule Take 10 mg by mouth 4 (four) times daily as needed for spasms.    Marland Kitchen esomeprazole (NEXIUM) 20 MG capsule Take 20 mg by mouth daily as needed (acid reflux).   Marland Kitchen ezetimibe (ZETIA) 10 MG tablet Take 10 mg by mouth daily.   . FEROSUL 325 (65 Fe) MG tablet Take 325 mg by mouth 2 (two) times daily.    . furosemide (LASIX) 40 MG tablet Take 1 tablet (40 mg total) by mouth daily. 10/16/2020: Taking 40 mg bid.  Marland Kitchen HUMALOG KWIKPEN 100 UNIT/ML KwikPen Inject 10-16 Units into the skin with breakfast, with lunch, and with evening meal. Sliding Scale:  CBG 140-180 = 10 units, 181-220 = 12 units, 221-260 = 14 units, 261-300 = 16 units , call MD over 300   . LANTUS SOLOSTAR 100 UNIT/ML Solostar Pen Inject 35 Units into the skin 2 (two) times daily. (Patient taking differently: Inject 40 Units into the skin 2 (two) times daily.)   . metoprolol succinate (TOPROL-XL) 100 MG 24 hr tablet Take 100 mg by mouth daily.   Marland Kitchen olmesartan (BENICAR) 40 MG tablet Take 1 tablet (40 mg total) by mouth daily.   . potassium chloride SA (KLOR-CON) 20 MEQ tablet Take 1 tablet (20 mEq total) by mouth 2 (two) times daily.   . pregabalin (LYRICA) 100 MG capsule Take 100 mg by mouth at  bedtime.   Marland Kitchen rOPINIRole (REQUIP) 1 MG tablet Take 2 mg by mouth at bedtime. 10/16/2020: Taking 3 tabs  . spironolactone (ALDACTONE) 25 MG tablet Take 1 tablet (25 mg total) by mouth daily.   . tadalafil (CIALIS) 10 MG tablet Take 10 mg by mouth daily as needed for erectile dysfunction.    . temazepam (RESTORIL) 15 MG capsule Take 15 mg by mouth at bedtime.   . TRULICITY 0.75 MG/0.5ML SOPN Inject 1.5 mg into the skin once a week. Friday    No facility-administered encounter medications on file as of 11/14/2020.    Functional Status:  In your present state of health, do you have any difficulty performing the following activities: 10/09/2020 01/31/2020  Hearing? N -  Vision? N -  Difficulty concentrating or making decisions? N -  Walking or climbing stairs? N -  Dressing or bathing? N -  Doing errands, shopping? N N  Some recent data might be hidden    Fall/Depression Screening: Fall Risk  01/24/2020 12/21/2019  Falls in the past year? 1 1  Number falls in past yr: 1 1  Injury with Fall? - 1  Risk for fall due to : - History of fall(s)  Risk for fall due to: Comment -  Electrolyte imbalance.  Follow up - Falls evaluation completed   PHQ 2/9 Scores 12/21/2019  PHQ - 2 Score 0    Assessment: S/P MI                         Recent COVID infection  Care Plan  Goals Addressed            This Visit's Progress   . Improve My Heart Health-Coronary Artery Disease over the next 90 days by attending cardiopulmoniary rehab.       Timeframe:  Long-Range Goal Priority:  High Start Date:    10/16/20                         Expected End Date:      01/21/21                 Follow Up Date 12/21/20  - be open to making changes - I can manage, know and watch for signs of a heart attack    Why is this important?    Lifestyle changes are key to improving the blood flow to your heart. Think about the things you can change and set a goal to live healthy.   Remember, when the blood vessels to your  heart start to get clogged you may not have any symptoms.   Over time, they can get worse.   Don't ignore the signs, like chest pain, and get help right away.     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 Prior to this pt has made lifestyle changes in his diet, glucose control and wt loss. Encouraged today to follow his discharge plan. Call MD if needed. Talk with NP weekly over next 4 weeks. Praised for his past accomplishments. Will continue working on continued lifestyle management for CAD and DM. 11/07/20 Pt reports he just tested + for COVID. He has had some chest discomfort, cough, fever. His wife is also +. He does not feel like this is setting him back at this time. He was to see the cardiologist today but it is now rescheduled for the 13th. He may be starting cardiac rehab after that visit. He admits he is just doing OK with trying to follow a heart healthy diet. He is moving around as much as he can tolerate. 11/14/20 Reports not feeling any better as far as his COVID sxs go. He is eating pretty much what he wants and not doing much moving around in general. Encouraged healthy choices and to move as much as he can.       Plan:  We agreed to talk again in one week. Follow-up: Patient agrees to Care Plan and Follow-up.   [name]. [name], MSN, [facility] Gerontological Nurse Practitioner [facility] Care Management [phone]

## 2020-11-14 NOTE — Telephone Encounter (Signed)
Pt states that his blood pressure is running between 100-110 systolic and 70 diastolic. Pt could not give a definitive bp reading as he does not check it that often. Pt is having intermittent SOB, but denies CP and thoracic pressure. Pt has appt w/ A. Vincenza Hews, NP on 11/19/20. Please advise.

## 2020-11-15 NOTE — Telephone Encounter (Signed)
Pt notified and verbalized understanding.

## 2020-11-16 ENCOUNTER — Other Ambulatory Visit: Payer: Self-pay

## 2020-11-16 ENCOUNTER — Encounter (HOSPITAL_COMMUNITY)
Admission: RE | Admit: 2020-11-16 | Discharge: 2020-11-16 | Disposition: A | Discharge status: Home / Self Care | Payer: Medicare Other | Source: Ambulatory Visit | Attending: Cardiology | Admitting: Cardiology

## 2020-11-16 VITALS — Ht 73.0 in | Wt 251.1 lb

## 2020-11-16 DIAGNOSIS — I214 Non-ST elevation (NSTEMI) myocardial infarction: Secondary | ICD-10-CM | POA: Diagnosis not present

## 2020-11-16 DIAGNOSIS — Z951 Presence of aortocoronary bypass graft: Secondary | ICD-10-CM | POA: Diagnosis not present

## 2020-11-16 NOTE — Progress Notes (Signed)
Cardiac Individual Treatment Plan  Patient Details  Name: RICKY GALLERY MRN: [id number] Date of Birth: 21-Oct-1950 Referring Provider:   Flowsheet Row CARDIAC REHAB PHASE II ORIENTATION from 11/16/2020 in [facility] CARDIAC REHABILITATION  Referring Provider Dr. Wyline Mood       Initial Encounter Date:  Flowsheet Row CARDIAC REHAB PHASE II ORIENTATION from 11/16/2020 in [place] CARDIAC REHABILITATION  Date 11/16/20       Visit Diagnosis: S/P CABG x 2  NSTEMI (non-ST elevated myocardial infarction) [facility])  Patient's Home Medications on Admission:  Current Outpatient Medications:    simethicone (MYLICON) 125 MG chewable tablet, Chew 125 mg by mouth every 6 (six) hours as needed for flatulence., Disp: , Rfl:    acetaminophen (TYLENOL) 500 MG tablet, Take 1,000 mg by mouth every 6 (six) hours as needed for moderate pain. , Disp: , Rfl:    amLODipine (NORVASC) 10 MG tablet, Take 1 tablet (10 mg total) by mouth daily., Disp: 90 tablet, Rfl: 3   aspirin EC 81 MG EC tablet, Take 1 tablet (81 mg total) by mouth daily., Disp: , Rfl:    BD PEN NEEDLE NANO U/F 32G X 4 MM MISC, Inject into the skin., Disp: , Rfl:    diclofenac Sodium (VOLTAREN) 1 % GEL, Apply 2 g topically 4 (four) times daily as needed (pain)., Disp: , Rfl:    dicyclomine (BENTYL) 10 MG capsule, Take 10 mg by mouth at bedtime as needed for spasms., Disp: , Rfl:    esomeprazole (NEXIUM) 20 MG capsule, Take 20 mg by mouth daily as needed (acid reflux)., Disp: , Rfl:    ezetimibe (ZETIA) 10 MG tablet, Take 10 mg by mouth daily., Disp: , Rfl:    FEROSUL 325 (65 Fe) MG tablet, Take 325 mg by mouth 2 (two) times daily. , Disp: , Rfl:    furosemide (LASIX) 40 MG tablet, Take 1 tablet (40 mg total) by mouth daily., Disp: 7 tablet, Rfl: 0   HUMALOG KWIKPEN 100 UNIT/ML KwikPen, Inject 10-16 Units into the skin with breakfast, with lunch, and with evening meal. Sliding Scale:  CBG 140-180 = 10 units, 181-220 = 12 units, 221-260 = 14 units,  261-300 = 16 units , call MD over 300, Disp: , Rfl:    LANTUS SOLOSTAR 100 UNIT/ML Solostar Pen, Inject 35 Units into the skin 2 (two) times daily. (Patient taking differently: Inject 40 Units into the skin 2 (two) times daily.), Disp: 15 mL, Rfl: 11   metoprolol succinate (TOPROL-XL) 100 MG 24 hr tablet, Take 100 mg by mouth daily., Disp: , Rfl:    olmesartan (BENICAR) 40 MG tablet, Take 1 tablet (40 mg total) by mouth daily., Disp: 90 tablet, Rfl: 3   potassium chloride SA (KLOR-CON) 20 MEQ tablet, Take 1 tablet (20 mEq total) by mouth 2 (two) times daily., Disp: 7 tablet, Rfl: 0   pregabalin (LYRICA) 100 MG capsule, Take 100 mg by mouth at bedtime., Disp: , Rfl:    rOPINIRole (REQUIP) 1 MG tablet, Take 3 mg by mouth at bedtime., Disp: , Rfl:    spironolactone (ALDACTONE) 25 MG tablet, Take 1 tablet (25 mg total) by mouth daily., Disp: 90 tablet, Rfl: 3   tadalafil (CIALIS) 10 MG tablet, Take 10 mg by mouth daily as needed for erectile dysfunction. , Disp: , Rfl:    temazepam (RESTORIL) 15 MG capsule, Take 15 mg by mouth at bedtime., Disp: , Rfl:    TRULICITY 0.75 MG/0.5ML SOPN, Inject 1.5 mg into the  skin once a week. Friday, Disp: , Rfl:   Past Medical History: Past Medical History:  Diagnosis Date   CAD (coronary artery disease)    a. mild nonobstructive CAD by cath in 2010 b. NST in 11/2015 showing a fixed defect with no ischemia and Coronary CT showing nonobstructive CAD along LAD and LCx c. 08/2018: s/p NSTEMI with cath showing acute total occlusion of LCx (treated with DES) and nonobstructive CAD along RCA and LAD.    Diabetes mellitus without complication (HCC)    Diverticulitis    Gallstones    Hypertension    IDA (iron deficiency anemia)    MI (myocardial infarction) (HCC)    PSVT (paroxysmal supraventricular tachycardia) (HCC)    Restless leg syndrome    Sleep apnea     Tobacco Use: Social History   Tobacco Use  Smoking Status Never  Smokeless Tobacco Never  Tobacco  Comments   since age 49 - marijuana     Labs: Recent Review Flowsheet Data     Labs for ITP Cardiac and Pulmonary Rehab Latest Ref Rng & Units 10/10/2020 10/10/2020 10/10/2020 10/10/2020 10/10/2020   Cholestrol 0 - 200 mg/dL - - - - -   LDLCALC 0 - 99 mg/dL - - - - -   HDL >43 mg/dL - - - - -   Trlycerides <150 mg/dL - - - - -   Hemoglobin A1c 4.8 - 5.6 % - - - - -   PHART 7.350 - 7.450 7.299(L) - 7.329(L) 7.400 7.394   PCO2ART 32.0 - 48.0 mmHg 58.9(H) - 46.7 41.9 38.5   HCO3 20.0 - 28.0 mmol/L 28.9(H) - 24.8 25.9 23.5   TCO2 22 - 32 mmol/L 31 27 26 27 25    ACIDBASEDEF 0.0 - 2.0 mmol/L - - 2.0 - 1.0   O2SAT % 99.0 - 89.0 99.0 95.0       Capillary Blood Glucose: Lab Results  Component Value Date   GLUCAP 157 (H) 10/15/2020   GLUCAP 205 (H) 10/14/2020   GLUCAP 207 (H) 10/14/2020   GLUCAP 193 (H) 10/14/2020   GLUCAP 144 (H) 10/14/2020    POCT Glucose     Row Name 11/16/20 1325             POCT Blood Glucose   Pre-Exercise 168 mg/dL                Exercise Target Goals: Exercise Program Goal: Individual exercise prescription set using results from initial 6 min walk test and THRR while considering  patient's activity barriers and safety.   Exercise Prescription Goal: Starting with aerobic activity 30 plus minutes a day, 3 days per week for initial exercise prescription. Provide home exercise prescription and guidelines that participant acknowledges understanding prior to discharge.  Activity Barriers & Risk Stratification:  Activity Barriers & Cardiac Risk Stratification - 11/16/20 1302       Activity Barriers & Cardiac Risk Stratification   Activity Barriers Arthritis;Neck/Spine Problems;Deconditioning;Shortness of Breath;Balance Concerns;History of Falls    Cardiac Risk Stratification High             6 Minute Walk:  6 Minute Walk     Row Name 11/16/20 1427         6 Minute Walk   Phase Initial     Distance 1275 feet     Walk Time 6 minutes     #  of Rest Breaks 0     MPH 2.41     METS 2.53  RPE 13     VO2 Peak 8.86     Symptoms Yes (comment)     Comments left hip tightness     Resting HR 87 bpm     Resting BP 108/62     Resting Oxygen Saturation  97 %     Exercise Oxygen Saturation  during 6 min walk 97 %     Max Ex. HR 100 bpm     Max Ex. BP 134/70     2 Minute Post BP 114/62              Oxygen Initial Assessment:   Oxygen Re-Evaluation:   Oxygen Discharge (Final Oxygen Re-Evaluation):   Initial Exercise Prescription:  Initial Exercise Prescription - 11/16/20 1400       Date of Initial Exercise RX and Referring Provider   Date 11/16/20    Referring Provider Dr. Wyline Mood    Expected Discharge Date 02/11/21      Treadmill   MPH 1.7    Grade 0    Minutes 17      Recumbant Elliptical   Level 1    RPM 60    Minutes 22      Prescription Details   Frequency (times per week) 3    Duration Progress to 30 minutes of continuous aerobic without signs/symptoms of physical distress      Intensity   THRR 40-80% of Max Heartrate 60-119    Ratings of Perceived Exertion 11-13    Perceived Dyspnea 0-4      Resistance Training   Training Prescription Yes    Weight 3 lbs    Reps 10-15             Perform Capillary Blood Glucose checks as needed.  Exercise Prescription Changes:   Exercise Comments:   Exercise Goals and Review:   Exercise Goals     Row Name 11/16/20 1431             Exercise Goals   Increase Physical Activity Yes       Intervention Provide advice, education, support and counseling about physical activity/exercise needs.;Develop an individualized exercise prescription for aerobic and resistive training based on initial evaluation findings, risk stratification, comorbidities and participant's personal goals.       Expected Outcomes Short Term: Attend rehab on a regular basis to increase amount of physical activity.;Long Term: Exercising regularly at least 3-5 days a  week.;Long Term: Add in home exercise to make exercise part of routine and to increase amount of physical activity.       Increase Strength and Stamina Yes       Intervention Provide advice, education, support and counseling about physical activity/exercise needs.;Develop an individualized exercise prescription for aerobic and resistive training based on initial evaluation findings, risk stratification, comorbidities and participant's personal goals.       Expected Outcomes Short Term: Increase workloads from initial exercise prescription for resistance, speed, and METs.;Short Term: Perform resistance training exercises routinely during rehab and add in resistance training at home;Long Term: Improve cardiorespiratory fitness, muscular endurance and strength as measured by increased METs and functional capacity ( )       Able to understand and use rate of perceived exertion (RPE) scale Yes       Intervention Provide education and explanation on how to use RPE scale       Expected Outcomes Short Term: Able to use RPE daily in rehab to express subjective intensity level;Long Term:  Able to use RPE  to guide intensity level when exercising independently       Knowledge and understanding of Target Heart Rate Range (THRR) Yes       Intervention Provide education and explanation of THRR including how the numbers were predicted and where they are located for reference       Expected Outcomes Short Term: Able to state/look up THRR;Long Term: Able to use THRR to govern intensity when exercising independently;Short Term: Able to use daily as guideline for intensity in rehab       Able to check pulse independently Yes       Intervention Provide education and demonstration on how to check pulse in carotid and radial arteries.;Review the importance of being able to check your own pulse for safety during independent exercise       Expected Outcomes Short Term: Able to explain why pulse checking is important during  independent exercise       Understanding of Exercise Prescription Yes       Intervention Provide education, explanation, and written materials on patient's individual exercise prescription       Expected Outcomes Short Term: Able to explain program exercise prescription;Long Term: Able to explain home exercise prescription to exercise independently                Exercise Goals Re-Evaluation :    Discharge Exercise Prescription (Final Exercise Prescription Changes):   Nutrition:  Target Goals: Understanding of nutrition guidelines, daily intake of sodium 1500mg , cholesterol 200mg , calories 30% from fat and 7% or less from saturated fats, daily to have 5 or more servings of fruits and vegetables.  Biometrics:  Pre Biometrics - 11/16/20 1431       Pre Biometrics   Height  (1.854 m)    Weight 251 lb 1.7 oz (113.9 kg)    Waist Circumference 45.5 inches    Hip Circumference 44 inches    Waist to Hip Ratio 1.03 %    BMI (Calculated) 33.14    Triceps Skinfold 13 mm    % Body Fat 31.1 %    Grip Strength 43.6 kg    Flexibility 19 in    Single Leg Stand 17.58 seconds              Nutrition Therapy Plan and Nutrition Goals:   Nutrition Assessments:  Nutrition Assessments - 11/16/20 1305       MEDFICTS Scores   Pre Score 44            MEDIFICTS Score Key: ?70 Need to make dietary changes  40-70 Heart Healthy Diet ? 40 Therapeutic Level Cholesterol Diet   Picture Your Plate Scores: <16 Unhealthy dietary pattern with much room for improvement. 41-50 Dietary pattern unlikely to meet recommendations for good health and room for improvement. 51-60 More healthful dietary pattern, with some room for improvement.  >60 Healthy dietary pattern, although there may be some specific behaviors that could be improved.    Nutrition Goals Re-Evaluation:   Nutrition Goals Discharge (Final Nutrition Goals Re-Evaluation):   Psychosocial: Target Goals:  Acknowledge presence or absence of significant depression and/or stress, maximize coping skills, provide positive support system. Participant is able to verbalize types and ability to use techniques and skills needed for reducing stress and depression.  Initial Review & Psychosocial Screening:  Initial Psych Review & Screening - 11/16/20 1303       Initial Review   Current issues with None Identified      Family Dynamics  Good Support System? Yes    Comments His wife is his main support system. He also goes to [employer] where he has made a lot of friends.      Barriers   Psychosocial barriers to participate in program There are no identifiable barriers or psychosocial needs.      Screening Interventions   Interventions Encouraged to exercise    Expected Outcomes Long Term goal: The participant improves quality of Life and PHQ9 Scores as seen by post scores and/or verbalization of changes;Short Term goal: Identification and review with participant of any Quality of Life or Depression concerns found by scoring the questionnaire.             Quality of Life Scores:  Quality of Life - 11/16/20 1432       Quality of Life   Select Quality of Life      Quality of Life Scores   Health/Function Pre 23.13 %    Socioeconomic Pre 25.93 %    Psych/Spiritual Pre 28.33 %    Family Pre 29.5 %    GLOBAL Pre 25.64 %            Scores of 19 and below usually indicate a poorer quality of life in these areas.  A difference of  2-3 points is a clinically meaningful difference.  A difference of 2-3 points in the total score of the Quality of Life Index has been associated with significant improvement in overall quality of life, self-image, physical symptoms, and general health in studies assessing change in quality of life.  PHQ-9: Recent Review Flowsheet Data     Depression screen [facility] 2/9 11/16/2020 12/21/2019   Decreased Interest 0 0   Down, Depressed, Hopeless 0 0   PHQ - 2  Score 0 0   Altered sleeping 1 -   Tired, decreased energy 1 -   Change in appetite 0 -   Feeling bad or failure about yourself  0 -   Trouble concentrating 0 -   Moving slowly or fidgety/restless 0 -   Suicidal thoughts 0 -   PHQ-9 Score 2 -   Difficult doing work/chores Somewhat difficult -      Interpretation of Total Score  Total Score Depression Severity:  1-4 = Minimal depression, 5-9 = Mild depression, 10-14 = Moderate depression, 15-19 = Moderately severe depression, 20-27 = Severe depression   Psychosocial Evaluation and Intervention:  Psychosocial Evaluation - 11/16/20 1433       Psychosocial Evaluation & Interventions   Interventions Encouraged to exercise with the program and follow exercise prescription    Comments Pt has no barriers to completing cardiac rehab. Pt has no psychosocial issues. He does smoke marijuana daily, but states that he has since he was 66 and has lived a successful life. He score a 2 on his PHQ-9. He does have problems staying asleep, and that does make him feel fatigued some days. He reports that he has been SOB since his CABG surgery, and he has been unable to exercise due to this. He used to live a very active lifestyle, hiking upwards of 50 miles per week. He has a good support system with his wife and friends at [employer] in [place]. His goals for the program are to lose weight and to decrease his SOB. He is enthusiastic about the program and eager to start.    Expected Outcomes The patient will continue to not have any identifiable psychosocial issues.    Continue  Psychosocial Services  No Follow up required             Psychosocial Re-Evaluation:   Psychosocial Discharge (Final Psychosocial Re-Evaluation):   Vocational Rehabilitation: Provide vocational rehab assistance to qualifying candidates.   Vocational Rehab Evaluation & Intervention:  Vocational Rehab - 11/16/20 1309       Initial Vocational Rehab Evaluation &  Intervention   Assessment shows need for Vocational Rehabilitation No             Education: Education Goals: Education classes will be provided on a weekly basis, covering required topics. Participant will state understanding/return demonstration of topics presented.  Learning Barriers/Preferences:  Learning Barriers/Preferences - 11/16/20 1307       Learning Barriers/Preferences   Learning Barriers None    Learning Preferences Written Material;Skilled Demonstration             Education Topics: Hypertension, Hypertension Reduction -Define heart disease and high blood pressure. Discus how high blood pressure affects the body and ways to reduce high blood pressure.   Exercise and Your Heart -Discuss why it is important to exercise, the FITT principles of exercise, normal and abnormal responses to exercise, and how to exercise safely.   Angina -Discuss definition of angina, causes of angina, treatment of angina, and how to decrease risk of having angina.   Cardiac Medications -Review what the following cardiac medications are used for, how they affect the body, and side effects that may occur when taking the medications.  Medications include Aspirin, Beta blockers, calcium channel blockers, ACE Inhibitors, angiotensin receptor blockers, diuretics, digoxin, and antihyperlipidemics.   Congestive Heart Failure -Discuss the definition of CHF, how to live with CHF, the signs and symptoms of CHF, and how keep track of weight and sodium intake.   Heart Disease and Intimacy -Discus the effect sexual activity has on the heart, how changes occur during intimacy as we age, and safety during sexual activity.   Smoking Cessation / COPD -Discuss different methods to quit smoking, the health benefits of quitting smoking, and the definition of COPD.   Nutrition I: Fats -Discuss the types of cholesterol, what cholesterol does to the heart, and how cholesterol levels can be  controlled.   Nutrition II: Labels -Discuss the different components of food labels and how to read food label   Heart Parts/Heart Disease and PAD -Discuss the anatomy of the heart, the pathway of blood circulation through the heart, and these are affected by heart disease.   Stress I: Signs and Symptoms -Discuss the causes of stress, how stress may lead to anxiety and depression, and ways to limit stress.   Stress II: Relaxation -Discuss different types of relaxation techniques to limit stress.   Warning Signs of Stroke / TIA -Discuss definition of a stroke, what the signs and symptoms are of a stroke, and how to identify when someone is having stroke.   Knowledge Questionnaire Score:  Knowledge Questionnaire Score - 11/16/20 1308       Knowledge Questionnaire Score   Pre Score 23/24             Core Components/Risk Factors/Patient Goals at Admission:  Personal Goals and Risk Factors at Admission - 11/16/20 1314       Core Components/Risk Factors/Patient Goals on Admission    Weight Management Yes;Weight Loss    Intervention Weight Management: Develop a combined nutrition and exercise program designed to reach desired caloric intake, while maintaining appropriate intake of nutrient and fiber, sodium and fats, and  appropriate energy expenditure required for the weight goal.;Weight Management: Provide education and appropriate resources to help participant work on and attain dietary goals.;Weight Management/Obesity: Establish reasonable short term and long term weight goals.;Obesity: Provide education and appropriate resources to help participant work on and attain dietary goals.    Expected Outcomes Short Term: Continue to assess and modify interventions until short term weight is achieved;Long Term: Adherence to nutrition and physical activity/exercise program aimed toward attainment of established weight goal;Weight Maintenance: Understanding of the daily nutrition  guidelines, which includes 25-35% calories from fat, 7% or less cal from saturated fats, less than 200mg  cholesterol, less than 1.5gm of sodium, & 5 or more servings of fruits and vegetables daily;Weight Loss: Understanding of general recommendations for a balanced deficit meal plan, which promotes 1-2 lb weight loss per week and includes a negative energy balance of [phone] kcal/d;Understanding recommendations for meals to include 15-35% energy as protein, 25-35% energy from fat, 35-60% energy from carbohydrates, less than 200mg  of dietary cholesterol, 20-35 gm of total fiber daily;Understanding of distribution of calorie intake throughout the day with the consumption of 4-5 meals/snacks    Improve shortness of breath with ADL's Yes    Intervention Provide education, individualized exercise plan and daily activity instruction to help decrease symptoms of SOB with activities of daily living.    Expected Outcomes Short Term: Improve cardiorespiratory fitness to achieve a reduction of symptoms when performing ADLs;Long Term: Be able to perform more ADLs without symptoms or delay the onset of symptoms    Diabetes Yes    Intervention Provide education about signs/symptoms and action to take for hypo/hyperglycemia.;Provide education about proper nutrition, including hydration, and aerobic/resistive exercise prescription along with prescribed medications to achieve blood glucose in normal ranges: Fasting glucose 65-99 mg/dL    Expected Outcomes Long Term: Attainment of HbA1C < 7%.;Short Term: Participant verbalizes understanding of the signs/symptoms and immediate care of hyper/hypoglycemia, proper foot care and importance of medication, aerobic/resistive exercise and nutrition plan for blood glucose control.    Hypertension Yes    Intervention Provide education on lifestyle modifcations including regular physical activity/exercise, weight management, moderate sodium restriction and increased consumption of fresh  fruit, vegetables, and low fat dairy, alcohol moderation, and smoking cessation.;Monitor prescription use compliance.    Expected Outcomes Short Term: Continued assessment and intervention until BP is < 140/2090mm HG in hypertensive participants. < 130/6680mm HG in hypertensive participants with diabetes, heart failure or chronic kidney disease.;Long Term: Maintenance of blood pressure at goal levels.             Core Components/Risk Factors/Patient Goals Review:    Core Components/Risk Factors/Patient Goals at Discharge (Final Review):    ITP Comments:   Comments: Patient arrived for 1st visit/orientation/education at 1230. Patient was referred to CR by Dr. Vickey SagesAtkins due to S/P CABG x2 (Z95.1) and NSTEMI (I21.4). During orientation advised patient on arrival and appointment times what to wear, what to do before, during and after exercise. Reviewed attendance and class policy.  Pt is scheduled to return Cardiac Rehab on 11/21/2020 at 1100. Pt was advised to come to class 15 minutes before class starts.  Discussed RPE/Dpysnea scales. Patient participated in warm up stretches. Patient was able to complete 6 minute walk test.  Telemetry: NSR. Patient was measured for the equipment. Discussed equipment safety with patient. Took patient pre-anthropometric measurements. Patient finished visit at 1415.

## 2020-11-16 NOTE — Progress Notes (Signed)
Cardiac/Pulmonary Rehab Medication Review by a Pharmacist  Does the patient  feel that his/her medications are working for him/her?  yes  Has the patient been experiencing any side effects to the medications prescribed?  no  Does the patient measure his/her own blood pressure or blood glucose at home?  yes   Does the patient have any problems obtaining medications due to transportation or finances?   no  Understanding of regimen: excellent Understanding of indications: excellent Potential of compliance: excellent  Questions asked to Determine Patient Understanding of Medication Regimen:  1. What is the name of the medication?  2. What is the medication used for?  3. When should it be taken?  4. How much should be taken?  5. How will you take it?  6. What side effects should you report?  Understanding Defined as: Excellent: All questions above are correct Good: Questions 1-4 are correct Fair: Questions 1-2 are correct  Poor: 1 or none of the above questions are correct   Pharmacist comments: Patient has good understanding meds and high compliance.  Not currently taking furosemide due to low BP and no issues with edema, he is to discuss with his MD.    Russell Morrow 11/16/2020 1:40 PM

## 2020-11-18 NOTE — Progress Notes (Signed)
Cardiology Office Note  Date: 11/18/2020   ID: [name], DOB Oct 17, 1950, MRN [id number]  PCP:  Russell Stabile, MD  Cardiologist:  Russell Rich, MD Electrophysiologist:  None   Chief Complaint: Status post CABG x 2  History of Present Illness: Russell Morrow is a 70 y.o. male with a history of CAD / CABG x 2,  DM2, PSVT, IDA, HTN, OSA.  He presented to ED on 10/08/2020 with chest pain described as exactly like the symptoms he had March 2020 when he had his prior MI. He had associated dyspnea, nausea ,and vomiting. He had cardiac catheterization demonstrating. Ostial to Proximal LAD lesion 40%, proximal RCA lesion 40%, proximal LAD to Mid LAD 100%, Proximal Lcx 90% EF 55-65%. He was recommend for CABG.   He subsequently had CABG x2 on 10/10/2020 by Dr. Renaldo Fiddler with LIMA to distal LAD; SVG to OM branch of LCx, endoscopic vein harvesting from right thigh.   He is here status post bypass surgery.  States he is doing reasonably well he denies any anginal or exertional symptoms out of the norm.  States he does have some mild DOE and some issues with ED since starting Toprol-XL at 100 mg daily.  He is wondering if he can decrease to Toprol-XL to help with the ED.  He denies any orthostatic symptoms, CVA or TIA-like symptoms, palpitations or arrhythmias, PND, orthopnea, bleeding issues.  Denies any claudication-like symptoms, DVT or PE-like symptoms, or lower extremity edema.  Denies any bleeding issues.  Blood pressure today is 124/70.  Heart rate is 80.  Current cardiac regimen includes; amlodipine 10 mg daily, Aldactone 25 mg daily, Lasix 40 mg daily, Zetia 10 mg daily, olmesartan 40 mg daily, aspirin 81 mg daily.  States his blood sugars are running around 140 today.   Past Medical History:  Diagnosis Date   CAD (coronary artery disease)    a. mild nonobstructive CAD by cath in 2010 b. NST in 11/2015 showing a fixed defect with no ischemia and Coronary CT showing nonobstructive CAD along LAD and  LCx c. 08/2018: s/p NSTEMI with cath showing acute total occlusion of LCx (treated with DES) and nonobstructive CAD along RCA and LAD.    Diabetes mellitus without complication (HCC)    Diverticulitis    Gallstones    Hypertension    IDA (iron deficiency anemia)    MI (myocardial infarction) (HCC)    PSVT (paroxysmal supraventricular tachycardia) (HCC)    Restless leg syndrome    Sleep apnea     Past Surgical History:  Procedure Laterality Date   CERVICAL FUSION     CHOLECYSTECTOMY N/A 01/31/2020   Procedure: LAPAROSCOPIC CHOLECYSTECTOMY;  Surgeon: Russell Macho, MD;  Location: [facility]S;  Service: General;  Laterality: N/A;   CORONARY ARTERY BYPASS GRAFT N/A 10/10/2020   Procedure: CORONARY ARTERY BYPASS GRAFTING (CABG)X2. LEFT INTERNAL MAMMARY ARTERY. RIGHT ENDOSCOPIC SAPHENOUS VEIN HARVESTING.;  Surgeon: Russell Dolin, MD;  Location: [facility] OR;  Service: Open Heart Surgery;  Laterality: N/A;   CORONARY STENT INTERVENTION N/A 08/13/2018   Procedure: CORONARY STENT INTERVENTION;  Surgeon: Russell Bollman, MD;  Location: [facility] INVASIVE CV LAB;  Service: Cardiovascular;  Laterality: N/A;   LEFT HEART CATH AND CORONARY ANGIOGRAPHY N/A 08/13/2018   Procedure: LEFT HEART CATH AND CORONARY ANGIOGRAPHY;  Surgeon: Russell Bollman, MD;  Location: [facility] INVASIVE CV LAB;  Service: Cardiovascular;  Laterality: N/A;   LEFT HEART CATH AND CORONARY ANGIOGRAPHY N/A 10/09/2020   Procedure: LEFT HEART CATH  AND CORONARY ANGIOGRAPHY;  Surgeon: [place]az[place], Peter M, MD;  Location: [facility] INVASIVE CV LAB;  Service: Cardiovascular;  Laterality: N/A;   nasal septum repair     TEE WITHOUT CARDIOVERSION N/A 10/10/2020   Procedure: TRANSESOPHAGEAL ECHOCARDIOGRAM (TEE);  Surgeon: Russell Dolin, MD;  Location: [facility] OR;  Service: Open Heart Surgery;  Laterality: N/A;    Current Outpatient Medications  Medication Sig Dispense Refill   acetaminophen (TYLENOL) 500 MG tablet Take 1,000 mg by mouth every 6 (six) hours as needed for moderate  pain.      amLODipine (NORVASC) 10 MG tablet Take 1 tablet (10 mg total) by mouth daily. 90 tablet 3   aspirin EC 81 MG EC tablet Take 1 tablet (81 mg total) by mouth daily.     BD PEN NEEDLE NANO U/F 32G X 4 MM MISC Inject into the skin.     diclofenac Sodium (VOLTAREN) 1 % GEL Apply 2 g topically 4 (four) times daily as needed (pain).     dicyclomine (BENTYL) 10 MG capsule Take 10 mg by mouth at bedtime as needed for spasms.     esomeprazole (NEXIUM) 20 MG capsule Take 20 mg by mouth daily as needed (acid reflux).     ezetimibe (ZETIA) 10 MG tablet Take 10 mg by mouth daily.     FEROSUL 325 (65 Fe) MG tablet Take 325 mg by mouth 2 (two) times daily.      furosemide (LASIX) 40 MG tablet Take 1 tablet (40 mg total) by mouth daily. 7 tablet 0   HUMALOG KWIKPEN 100 UNIT/ML KwikPen Inject 10-16 Units into the skin with breakfast, with lunch, and with evening meal. Sliding Scale:  CBG 140-180 = 10 units, 181-220 = 12 units, 221-260 = 14 units, 261-300 = 16 units , call MD over 300     LANTUS SOLOSTAR 100 UNIT/ML Solostar Pen Inject 35 Units into the skin 2 (two) times daily. (Patient taking differently: Inject 40 Units into the skin 2 (two) times daily.) 15 mL 11   metoprolol succinate (TOPROL-XL) 100 MG 24 hr tablet Take 100 mg by mouth daily.     olmesartan (BENICAR) 40 MG tablet Take 1 tablet (40 mg total) by mouth daily. 90 tablet 3   potassium chloride SA (KLOR-CON) 20 MEQ tablet Take 1 tablet (20 mEq total) by mouth 2 (two) times daily. 7 tablet 0   pregabalin (LYRICA) 100 MG capsule Take 100 mg by mouth at bedtime.     rOPINIRole (REQUIP) 1 MG tablet Take 3 mg by mouth at bedtime.     simethicone (MYLICON) 125 MG chewable tablet Chew 125 mg by mouth every 6 (six) hours as needed for flatulence.     spironolactone (ALDACTONE) 25 MG tablet Take 1 tablet (25 mg total) by mouth daily. 90 tablet 3   tadalafil (CIALIS) 10 MG tablet Take 10 mg by mouth daily as needed for erectile dysfunction.       temazepam (RESTORIL) 15 MG capsule Take 15 mg by mouth at bedtime.     TRULICITY 0.75 MG/0.5ML SOPN Inject 1.5 mg into the skin once a week. Friday     No current facility-administered medications for this visit.   Allergies:  Statins   Social History: The patient  reports that he has never smoked. He has never used smokeless tobacco. He reports previous alcohol use of about 14.0 standard drinks of alcohol per week. He reports current drug use. Drug: Marijuana.   Family History: The patient's family history  includes Cancer in his mother; Diabetes in his brother and father; Heart disease in his brother and father; Hypertension in his brother, father, and mother.   ROS:  Please see the history of present illness. Otherwise, complete review of systems is positive for none.  All other systems are reviewed and negative.   Physical Exam: VS:  There were no vitals taken for this visit., BMI There is no height or weight on file to calculate BMI.  Wt Readings from Last 3 Encounters:  11/16/20 251 lb 1.7 oz (113.9 kg)  10/25/20 243 lb (110.2 kg)  10/15/20 251 lb 14.4 oz (114.3 kg)    General: Patient appears comfortable at rest. HEENT: Conjunctiva and lids normal, oropharynx clear with moist mucosa. Neck: Supple, no elevated JVP or carotid bruits, no thyromegaly. Lungs: Clear to auscultation, nonlabored breathing at rest. Cardiac: Regular rate and rhythm, no S3 or significant systolic murmur, no pericardial rub. Abdomen: Soft, nontender, no hepatomegaly, bowel sounds present, no guarding or rebound. Extremities: No pitting edema, distal pulses 2+. Skin: Warm and dry. Musculoskeletal: No kyphosis. Neuropsychiatric: Alert and oriented x3, affect grossly appropriate.  ECG:  EKG Oct 11, 2020 sinus rhythm with premature atrial complexes rate of 85, possible left atrial enlargement, LAD, inferior infarct age undetermined  Recent Labwork: 10/09/2020: ALT 42; AST 23 10/11/2020: Magnesium 2.5 10/14/2020:  Hemoglobin 13.8; Platelets 205 10/15/2020: BUN 17; Creatinine, Ser 0.94; Potassium 3.6; Sodium 134     Component Value Date/Time   CHOL 130 10/09/2020 0556   TRIG 133 10/09/2020 0556   HDL 34 (L) 10/09/2020 0556   CHOLHDL 3.8 10/09/2020 0556   VLDL 27 10/09/2020 0556   LDLCALC 69 10/09/2020 0556    Other Studies Reviewed Today:  Dr Vickey SagesAtkins 10/10/2020 Coronary Artery Bypass Grafting x 2              Left Internal Mammary Artery to Distal Left Anterior Descending Coronary Arteryl; Saphenous Vein Graft to Obtuse Marginal Branch of Left Circumflex Coronary Artery; Endoscopic Vein Harvest from right Thigh    Cardiac catheterization 10/09/2020   Conclusion    Ost LAD to Prox LAD lesion is 40% stenosed. Previously placed Prox Cx to Mid Cx drug eluting stent is widely patent. RPAV lesion is 30% stenosed. Prox RCA lesion is 40% stenosed. Prox LAD to Mid LAD lesion is 100% stenosed. Prox Cx lesion is 90% stenosed. The left ventricular systolic function is normal. LV end diastolic pressure is normal. The left ventricular ejection fraction is 55-65% by visual estimate.   1. Severe 2 vessel obstructive/occlusive CAD. There is occlusion of the mid LAD immediately after the first diagonal. The LAD fills well by right to left collaterals. There is severe segmental disease in the proximal LCx prior to the previous stent 2. Normal LV function 3. Normal LVEDP   Plan: given multivessel CAD in a diabetic I would recommend consideration for CABG.  Coronary Diagrams   Diagnostic Dominance: Right       Carotid duplex 10/09/2020 Right Carotid: Velocities in the right ICA are consistent with a 1-39% stenosis. Left Carotid: Velocities in the left ICA are consistent with a 1-39% stenosis. Vertebrals: Bilateral vertebral arteries demonstrate antegrade flow. Subclavians: Normal flow hemodynamics were seen in bilateral subclavian arteries   ABI 10/09/2020 Right ABI: Resting right ankle-brachial index  is elevated, suggestive of median artery calcification. Waveforms are within normal limits at rest. Left ABI: Resting left ankle-brachial index is within normal range. No evidence of significant left lower extremity arterial disease.  Right Upper Extremity: Doppler waveforms remain within normal limits with right radial compression. Doppler waveforms remain within normal limits with right ulnar compression. Left Upper Extremity: Doppler waveforms decrease >50% with left radial compression. Doppler waveform obliterate with left ulnar compression.     Echocardiogram 06/20/2020  1. Left ventricular ejection fraction, by estimation, is 60 to 65%. The left ventricle has normal function. The left ventricle has no regional wall motion abnormalities. There is moderate left ventricular hypertrophy. Left ventricular diastolic parameters are indeterminate. Elevated left atrial pressure. 2. Right ventricular systolic function is normal. The right ventricular size is normal. 3. The mitral valve is normal in structure. Trivial mitral valve regurgitation. No evidence of mitral stenosis. 4. The aortic valve is tricuspid. There is mild calcification of the aortic valve. There is mild thickening of the aortic valve. Aortic valve regurgitation is not visualized. No aortic stenosis is present. 5. Aortic dilatation noted. There is mild dilatation of the ascending aorta, measuring 40 mm. 6. The inferior vena cava is normal in size with greater than 50% respiratory variability, suggesting right atrial pressure of 3 mmHg.    Assessment and Plan:  1. S/P CABG x 2   2. Coronary artery disease involving native coronary artery of native heart without angina pectoris   3. Essential hypertension   4. Mixed hyperlipidemia    1. S/P CABG x 2 Status post CABG x2 by Dr. Renaldo Fiddler on 10/10/2020 with LIMA to distal LAD, SVG to OM branch of LCx, endoscopic vein harvest from right thigh.  Median sternotomy scar, chest tube  insertion sites, drain sites and right vein harvesting site looks good.  No signs of redness, swelling or infection noted.  2. Coronary artery disease involving native coronary artery of native heart without angina pectoris Cardiac catheterization on 11/05/2020 ostial proximal to proximal LAD 40%, previously placed proximal circumflex to mid circumflex DES widely patent care RPA V lesion 30%, proximal RCA lesion 40%, proximal LAD to mid LAD 100%, proximal circumflex lesion 90%, LV EDP normal, EF 55 to 65%.  Denies any anginal or exertional symptoms currently.  Continue aspirin 81 mg daily.  Decrease Toprol-XL to 100 mg daily due to complaints of ED.  3. Essential hypertension Blood pressure 124/70.  Continue amlodipine 10 mg daily, Lasix 40 mg daily, Toprol XL 50 mg daily, olmesartan 40 mg daily, spironolactone 25 mg daily.  4. Mixed hyperlipidemia Intolerant to statins.  Continue Zetia 10 mg daily.  Medication Adjustments/Labs and Tests Ordered: Current medicines are reviewed at length with the patient today.  Concerns regarding medicines are outlined above.   Disposition: Follow-up with Dr. Wyline Mood or APP 3 months  Signed, Rennis Harding, NP 11/18/2020 9:12 PM    [facility] at [facility] [address] [place], [place], [place] [address] Phone: [phone]; Fax: [phone]

## 2020-11-19 ENCOUNTER — Ambulatory Visit: Payer: Medicare Other | Admitting: Family Medicine

## 2020-11-19 ENCOUNTER — Encounter: Payer: Self-pay | Admitting: Family Medicine

## 2020-11-19 VITALS — BP 124/70 | HR 80 | Ht 73.0 in | Wt 254.0 lb

## 2020-11-19 DIAGNOSIS — E782 Mixed hyperlipidemia: Secondary | ICD-10-CM | POA: Diagnosis not present

## 2020-11-19 DIAGNOSIS — Z951 Presence of aortocoronary bypass graft: Secondary | ICD-10-CM

## 2020-11-19 DIAGNOSIS — I10 Essential (primary) hypertension: Secondary | ICD-10-CM | POA: Diagnosis not present

## 2020-11-19 DIAGNOSIS — G720 Drug-induced myopathy: Secondary | ICD-10-CM

## 2020-11-19 DIAGNOSIS — I2510 Atherosclerotic heart disease of native coronary artery without angina pectoris: Secondary | ICD-10-CM

## 2020-11-19 DIAGNOSIS — T466X5A Adverse effect of antihyperlipidemic and antiarteriosclerotic drugs, initial encounter: Secondary | ICD-10-CM

## 2020-11-19 MED ORDER — METOPROLOL SUCCINATE ER 50 MG PO TB24: 50.0000 mg | ORAL_TABLET | Freq: Every day | ORAL | 3 refills | Status: AC

## 2020-11-19 NOTE — Patient Instructions (Signed)
Medication Instructions:  Your physician has recommended you make the following change in your medication:  Decrease metoprolol succinate (toprol xl) to 50 mg daily Continue other medications the same  Labwork: none  Testing/Procedures: none  Follow-Up: Your physician recommends that you schedule a follow-up appointment in: 3 months  Any Other Special Instructions Will Be Listed Below (If Applicable).  If you need a refill on your cardiac medications before your next appointment, please call your pharmacy.

## 2020-11-21 ENCOUNTER — Other Ambulatory Visit: Payer: Self-pay

## 2020-11-21 ENCOUNTER — Encounter (HOSPITAL_COMMUNITY)
Admission: RE | Admit: 2020-11-21 | Discharge: 2020-11-21 | Disposition: A | Discharge status: Home / Self Care | Payer: Medicare Other | Source: Ambulatory Visit | Attending: Cardiology | Admitting: Cardiology

## 2020-11-21 ENCOUNTER — Other Ambulatory Visit: Payer: Self-pay | Admitting: *Deleted

## 2020-11-21 DIAGNOSIS — Z951 Presence of aortocoronary bypass graft: Secondary | ICD-10-CM | POA: Diagnosis not present

## 2020-11-21 DIAGNOSIS — I214 Non-ST elevation (NSTEMI) myocardial infarction: Secondary | ICD-10-CM | POA: Diagnosis not present

## 2020-11-21 NOTE — Progress Notes (Signed)
Daily Session Note  Patient Details  Name: Russell Morrow MRN: [id number] Date of Birth: [birth year] Referring Provider:   Flowsheet Row CARDIAC REHAB PHASE II ORIENTATION from 11/16/2020 in [place]  Referring Provider Dr. Harl Bowie       Encounter Date: 11/21/2020  Check In:  Session Check In - 11/21/20 1100       Check-In   Supervising physician immediately available to respond to emergencies [facility] MD immediately available    Physician(s) Dr. Johnsie Cancel    Location [facility]    Staff Present Hoy Register, MS, ACSM-CEP, Exercise Physiologist;Debra Wynetta Emery, RN, BSN    Virtual Visit No    Medication changes reported     Yes    Comments metoprolol decreased to 50 mg from 100 mg in the morning    Fall or balance concerns reported    Yes    Comments gets dizzy due to orthostatic hypotension    Tobacco Cessation No Change    Warm-up and Cool-down Performed as group-led instruction    Resistance Training Performed Yes    VAD Patient? No    PAD/SET Patient? No      Pain Assessment   Currently in Pain? No/denies    Multiple Pain Sites No             Capillary Blood Glucose: No results found for this or any previous visit (from the past 24 hour(s)).    Social History   Tobacco Use  Smoking Status Never  Smokeless Tobacco Never  Tobacco Comments   since age 24 - marijuana     Goals Met:  Independence with exercise equipment Exercise tolerated well No report of cardiac concerns or symptoms Strength training completed today  Goals Unmet:  Not Applicable  Comments: checkout time is 1200   Dr. Kathie Dike is Medical Director for [facility].

## 2020-11-23 ENCOUNTER — Encounter (HOSPITAL_COMMUNITY): Payer: Medicare Other

## 2020-11-26 ENCOUNTER — Encounter (HOSPITAL_COMMUNITY)
Admission: RE | Admit: 2020-11-26 | Discharge: 2020-11-26 | Disposition: A | Discharge status: Home / Self Care | Payer: Medicare Other | Source: Ambulatory Visit | Attending: Cardiology | Admitting: Cardiology

## 2020-11-26 ENCOUNTER — Other Ambulatory Visit: Payer: Self-pay | Admitting: *Deleted

## 2020-11-26 ENCOUNTER — Other Ambulatory Visit: Payer: Self-pay

## 2020-11-26 VITALS — Wt 253.5 lb

## 2020-11-26 DIAGNOSIS — I214 Non-ST elevation (NSTEMI) myocardial infarction: Secondary | ICD-10-CM

## 2020-11-26 DIAGNOSIS — Z951 Presence of aortocoronary bypass graft: Secondary | ICD-10-CM

## 2020-11-26 NOTE — Progress Notes (Signed)
Daily Session Note  Patient Details  Name: Russell Morrow MRN: [id number] Date of Birth: 1951-05-23 Referring Provider:   Flowsheet Row CARDIAC REHAB PHASE II ORIENTATION from 11/16/2020 in [place]  Referring Provider Dr. Harl Bowie       Encounter Date: 11/26/2020  Check In:  Session Check In - 11/26/20 1100       Check-In   Supervising physician immediately available to respond to emergencies [facility] MD immediately available    Physician(s) Dr. Harrington Challenger    Location [facility]    Staff Present Hoy Register, MS, ACSM-CEP, Exercise Physiologist;Debra Wynetta Emery, RN, BSN    Virtual Visit No    Medication changes reported     No    Fall or balance concerns reported    No    Tobacco Cessation No Change    Warm-up and Cool-down Performed as group-led instruction    Resistance Training Performed Yes    VAD Patient? No    PAD/SET Patient? No      Pain Assessment   Currently in Pain? No/denies    Multiple Pain Sites No             Capillary Blood Glucose: No results found for this or any previous visit (from the past 24 hour(s)).    Social History   Tobacco Use  Smoking Status Never  Smokeless Tobacco Never  Tobacco Comments   since age 86 - marijuana     Goals Met:  Independence with exercise equipment Exercise tolerated well No report of cardiac concerns or symptoms Strength training completed today  Goals Unmet:  Not Applicable  Comments: checkout time is 1200   Dr. Kathie Dike is Medical Director for [facility] Pulmonary Re[facility].

## 2020-11-26 NOTE — Patient Outreach (Signed)
[facility]) Care Management  [facility] Care Manager  11/26/2020   Russell Morrow 07/30/1950 [id number]  Subjective: F/U hospitalization, MI  Encounter Medications:  Outpatient Encounter Medications as of 11/26/2020  Medication Sig Note   acetaminophen (TYLENOL) 500 MG tablet Take 1,000 mg by mouth every 6 (six) hours as needed for moderate pain.     amLODipine (NORVASC) 10 MG tablet Take 10 mg by mouth daily.    aspirin EC 81 MG EC tablet Take 1 tablet (81 mg total) by mouth daily.    BD PEN NEEDLE NANO U/F 32G X 4 MM MISC Inject into the skin.    diclofenac Sodium (VOLTAREN) 1 % GEL Apply 2 g topically 4 (four) times daily as needed (pain).    dicyclomine (BENTYL) 10 MG capsule Take 10 mg by mouth at bedtime as needed for spasms.    esomeprazole (NEXIUM) 20 MG capsule Take 20 mg by mouth daily as needed (acid reflux).    ezetimibe (ZETIA) 10 MG tablet Take 10 mg by mouth daily.    FEROSUL 325 (65 Fe) MG tablet Take 325 mg by mouth 2 (two) times daily.     furosemide (LASIX) 40 MG tablet Take 1 tablet (40 mg total) by mouth daily. 11/16/2020: Patient not taking currently due to low BP- no edema issues. He is to address with MD   HUMALOG KWIKPEN 100 UNIT/ML KwikPen Inject 10-16 Units into the skin with breakfast, with lunch, and with evening meal. Sliding Scale:  CBG 140-180 = 10 units, 181-220 = 12 units, 221-260 = 14 units, 261-300 = 16 units , call MD over 300    LANTUS SOLOSTAR 100 UNIT/ML Solostar Pen Inject 35 Units into the skin 2 (two) times daily. (Patient taking differently: Inject 40 Units into the skin 2 (two) times daily.)    metoprolol succinate (TOPROL-XL) 50 MG 24 hr tablet Take 1 tablet (50 mg total) by mouth daily. Take with or immediately following a meal.    olmesartan (BENICAR) 40 MG tablet Take 1 tablet (40 mg total) by mouth daily.    potassium chloride SA (KLOR-CON) 20 MEQ tablet Take 1 tablet (20 mEq total) by mouth 2 (two) times daily.    pregabalin  (LYRICA) 100 MG capsule Take 100 mg by mouth at bedtime.    rOPINIRole (REQUIP) 1 MG tablet Take 3 mg by mouth at bedtime. 10/16/2020: Taking 3 tabs   simethicone (MYLICON) 125 MG chewable tablet Chew 125 mg by mouth every 6 (six) hours as needed for flatulence.    spironolactone (ALDACTONE) 25 MG tablet Take 25 mg by mouth daily.    tadalafil (CIALIS) 10 MG tablet Take 10 mg by mouth daily as needed for erectile dysfunction.     temazepam (RESTORIL) 15 MG capsule Take 15 mg by mouth at bedtime.    TRULICITY 1.5 MG/0.5ML SOPN Inject 1.5 mg into the skin once a week.    No facility-administered encounter medications on file as of 11/26/2020.    Functional Status:  In your present state of health, do you have any difficulty performing the following activities: 10/09/2020 01/31/2020  Hearing? N -  Vision? N -  Difficulty concentrating or making decisions? N -  Walking or climbing stairs? N -  Dressing or bathing? N -  Doing errands, shopping? N N  Some recent data might be hidden    Fall/Depression Screening: Fall Risk  11/16/2020 01/24/2020 12/21/2019  Falls in the past year? 1 1 1   Number falls in  past yr: 1 1 1   Injury with Fall? 1 - 1  Risk for fall due to : History of fall(s);Medication side effect - History of fall(s)  Risk for fall due to: Comment - - Electrolyte imbalance.  Follow up Falls evaluation completed;Education provided;Falls prevention discussed - Falls evaluation completed   PHQ 2/9 Scores 11/16/2020 12/21/2019  PHQ - 2 Score 0 0  PHQ- 9 Score 2 -    Assessment: CAD, Hx MI  Care Plan   Goals Addressed             This Visit's Progress    Improve My Heart Health-Coronary Artery Disease over the next 90 days by attending cardiopulmoniary rehab.   On track    Timeframe:  Long-Range Goal Priority:  High Start Date:    10/16/20                         Expected End Date:      01/21/21                 Follow Up Date 12/2020  - be open to making changes - I can  manage, know and watch for signs of a heart attack    Why is this important?   Lifestyle changes are key to improving the blood flow to your heart. Think about the things you can change and set a goal to live healthy.  Remember, when the blood vessels to your heart start to get clogged you may not have any symptoms.  Over time, they can get worse.  Don't ignore the signs, like chest pain, and get help right away.     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 Retired 12/16/20 and very knowledgeable about heart health. Prior to this pt has made lifestyle changes in his diet, glucose control and wt loss. Encouraged today to follow his discharge plan. Call MD if needed. Talk with NP weekly over next 4 weeks. Praised for his past accomplishments. Will continue working on continued lifestyle management for CAD and DM. 11/07/20 Pt reports he just tested + for COVID. He has had some chest discomfort, cough, fever. His wife is also +. He does not feel like this is setting him back at this time. He was to see the cardiologist today but it is now rescheduled for the 13th. He may be starting cardiac rehab after that visit. He admits he is just doing OK with trying to follow a heart healthy diet. He is moving around as much as he can tolerate. 11/14/20 Reports not feeling any better as far as his COVID sxs go. He is eating pretty much what he wants and not doing much moving around in general. Encouraged healthy choices and to move as much as he can. 11/26/20 Started Cardio-pulmonary rehab and is doing well. Reports his glucose today was 122 FBS and 145 at lunch. His Hgb A1C when he was in the hospital was 6.7. His current wt is 250#. 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 He is feeling pretty proud of himself and we celebrated his accomplishments and lifestyle management improvements!         Plan: We agreed to talk again in one month. Follow-up: Patient agrees to Care Plan and Follow-up. Follow-up in 1 month(s)  Birdena Kingma  C. 11/28/20, MSN, [facility] Nurse Practitioner [facility] Care Management [phone]

## 2020-11-28 ENCOUNTER — Encounter (HOSPITAL_COMMUNITY): Payer: Medicare Other

## 2020-11-28 NOTE — Progress Notes (Signed)
Cardiac Individual Treatment Plan  Patient Details  Name: Russell Morrow MRN: [id number] Date of Birth: September 15, 1950 Referring Provider:   Flowsheet Row CARDIAC REHAB PHASE II ORIENTATION from 11/16/2020 in [facility] CARDIAC REHABILITATION  Referring Provider Dr. Wyline Mood       Initial Encounter Date:  Flowsheet Row CARDIAC REHAB PHASE II ORIENTATION from 11/16/2020 in [place] CARDIAC REHABILITATION  Date 11/16/20       Visit Diagnosis: S/P CABG x 2  NSTEMI (non-ST elevated myocardial infarction) (HCC)  Patient's Home Medications on Admission:  Current Outpatient Medications:    acetaminophen (TYLENOL) 500 MG tablet, Take 1,000 mg by mouth every 6 (six) hours as needed for moderate pain. , Disp: , Rfl:    amLODipine (NORVASC) 10 MG tablet, Take 10 mg by mouth daily., Disp: , Rfl:    aspirin EC 81 MG EC tablet, Take 1 tablet (81 mg total) by mouth daily., Disp: , Rfl:    BD PEN NEEDLE NANO U/F 32G X 4 MM MISC, Inject into the skin., Disp: , Rfl:    diclofenac Sodium (VOLTAREN) 1 % GEL, Apply 2 g topically 4 (four) times daily as needed (pain)., Disp: , Rfl:    dicyclomine (BENTYL) 10 MG capsule, Take 10 mg by mouth at bedtime as needed for spasms., Disp: , Rfl:    esomeprazole (NEXIUM) 20 MG capsule, Take 20 mg by mouth daily as needed (acid reflux)., Disp: , Rfl:    ezetimibe (ZETIA) 10 MG tablet, Take 10 mg by mouth daily., Disp: , Rfl:    FEROSUL 325 (65 Fe) MG tablet, Take 325 mg by mouth 2 (two) times daily. , Disp: , Rfl:    furosemide (LASIX) 40 MG tablet, Take 1 tablet (40 mg total) by mouth daily., Disp: 7 tablet, Rfl: 0   HUMALOG KWIKPEN 100 UNIT/ML KwikPen, Inject 10-16 Units into the skin with breakfast, with lunch, and with evening meal. Sliding Scale:  CBG 140-180 = 10 units, 181-220 = 12 units, 221-260 = 14 units, 261-300 = 16 units , call MD over 300, Disp: , Rfl:    LANTUS SOLOSTAR 100 UNIT/ML Solostar Pen, Inject 35 Units into the skin 2 (two) times daily. (Patient  taking differently: Inject 40 Units into the skin 2 (two) times daily.), Disp: 15 mL, Rfl: 11   metoprolol succinate (TOPROL-XL) 50 MG 24 hr tablet, Take 1 tablet (50 mg total) by mouth daily. Take with or immediately following a meal., Disp: 90 tablet, Rfl: 3   olmesartan (BENICAR) 40 MG tablet, Take 1 tablet (40 mg total) by mouth daily., Disp: 90 tablet, Rfl: 3   potassium chloride SA (KLOR-CON) 20 MEQ tablet, Take 1 tablet (20 mEq total) by mouth 2 (two) times daily., Disp: 7 tablet, Rfl: 0   pregabalin (LYRICA) 100 MG capsule, Take 100 mg by mouth at bedtime., Disp: , Rfl:    rOPINIRole (REQUIP) 1 MG tablet, Take 3 mg by mouth at bedtime., Disp: , Rfl:    simethicone (MYLICON) 125 MG chewable tablet, Chew 125 mg by mouth every 6 (six) hours as needed for flatulence., Disp: , Rfl:    spironolactone (ALDACTONE) 25 MG tablet, Take 25 mg by mouth daily., Disp: , Rfl:    tadalafil (CIALIS) 10 MG tablet, Take 10 mg by mouth daily as needed for erectile dysfunction. , Disp: , Rfl:    temazepam (RESTORIL) 15 MG capsule, Take 15 mg by mouth at bedtime., Disp: , Rfl:    TRULICITY 1.5 MG/0.5ML SOPN, Inject 1.5  mg into the skin once a week., Disp: , Rfl:   Past Medical History: Past Medical History:  Diagnosis Date   CAD (coronary artery disease)    a. mild nonobstructive CAD by cath in 2010 b. NST in 11/2015 showing a fixed defect with no ischemia and Coronary CT showing nonobstructive CAD along LAD and LCx c. 08/2018: s/p NSTEMI with cath showing acute total occlusion of LCx (treated with DES) and nonobstructive CAD along RCA and LAD.    Diabetes mellitus without complication (HCC)    Diverticulitis    Gallstones    Hypertension    IDA (iron deficiency anemia)    MI (myocardial infarction) (HCC)    PSVT (paroxysmal supraventricular tachycardia) (HCC)    Restless leg syndrome    Sleep apnea     Tobacco Use: Social History   Tobacco Use  Smoking Status Never  Smokeless Tobacco Never   Tobacco Comments   since age 9 - marijuana     Labs: Recent Review Flowsheet Data     Labs for ITP Cardiac and Pulmonary Rehab Latest Ref Rng & Units 10/10/2020 10/10/2020 10/10/2020 10/10/2020 10/10/2020   Cholestrol 0 - 200 mg/dL - - - - -   LDLCALC 0 - 99 mg/dL - - - - -   HDL >75 mg/dL - - - - -   Trlycerides <150 mg/dL - - - - -   Hemoglobin A1c 4.8 - 5.6 % - - - - -   PHART 7.350 - 7.450 7.299(L) - 7.329(L) 7.400 7.394   PCO2ART 32.0 - 48.0 mmHg 58.9(H) - 46.7 41.9 38.5   HCO3 20.0 - 28.0 mmol/L 28.9(H) - 24.8 25.9 23.5   TCO2 22 - 32 mmol/L 31 27 26 27 25    ACIDBASEDEF 0.0 - 2.0 mmol/L - - 2.0 - 1.0   O2SAT % 99.0 - 89.0 99.0 95.0       Capillary Blood Glucose: Lab Results  Component Value Date   GLUCAP 157 (H) 10/15/2020   GLUCAP 205 (H) 10/14/2020   GLUCAP 207 (H) 10/14/2020   GLUCAP 193 (H) 10/14/2020   GLUCAP 144 (H) 10/14/2020    POCT Glucose     Row Name 11/16/20 1325             POCT Blood Glucose   Pre-Exercise 168 mg/dL                Exercise Target Goals: Exercise Program Goal: Individual exercise prescription set using results from initial 6 min walk test and THRR while considering  patient's activity barriers and safety.   Exercise Prescription Goal: Starting with aerobic activity 30 plus minutes a day, 3 days per week for initial exercise prescription. Provide home exercise prescription and guidelines that participant acknowledges understanding prior to discharge.  Activity Barriers & Risk Stratification:  Activity Barriers & Cardiac Risk Stratification - 11/16/20 1302       Activity Barriers & Cardiac Risk Stratification   Activity Barriers Arthritis;Neck/Spine Problems;Deconditioning;Shortness of Breath;Balance Concerns;History of Falls    Cardiac Risk Stratification High             6 Minute Walk:  6 Minute Walk     Row Name 11/16/20 1427         6 Minute Walk   Phase Initial     Distance 1275 feet     Walk Time 6  minutes     # of Rest Breaks 0     MPH 2.41     METS  2.53     RPE 13     VO2 Peak 8.86     Symptoms Yes (comment)     Comments left hip tightness     Resting HR 87 bpm     Resting BP 108/62     Resting Oxygen Saturation  97 %     Exercise Oxygen Saturation  during 6 min walk 97 %     Max Ex. HR 100 bpm     Max Ex. BP 134/70     2 Minute Post BP 114/62              Oxygen Initial Assessment:   Oxygen Re-Evaluation:   Oxygen Discharge (Final Oxygen Re-Evaluation):   Initial Exercise Prescription:  Initial Exercise Prescription - 11/16/20 1400       Date of Initial Exercise RX and Referring Provider   Date 11/16/20    Referring Provider Dr. Wyline MoodBranch    Expected Discharge Date 02/11/21      Treadmill   MPH 1.7    Grade 0    Minutes 17      Recumbant Elliptical   Level 1    RPM 60    Minutes 22      Prescription Details   Frequency (times per week) 3    Duration Progress to 30 minutes of continuous aerobic without signs/symptoms of physical distress      Intensity   THRR 40-80% of Max Heartrate 60-119    Ratings of Perceived Exertion 11-13    Perceived Dyspnea 0-4      Resistance Training   Training Prescription Yes    Weight 3 lbs    Reps 10-15             Perform Capillary Blood Glucose checks as needed.  Exercise Prescription Changes:   Exercise Prescription Changes     Row Name 11/26/20 1200             Response to Exercise   Blood Pressure (Admit) 128/66       Blood Pressure (Exercise) 128/56       Blood Pressure (Exit) 136/70       Heart Rate (Admit) 92 bpm       Heart Rate (Exercise) 111 bpm       Heart Rate (Exit) 101 bpm       Rating of Perceived Exertion (Exercise) 12       Duration Continue with 30 min of aerobic exercise without signs/symptoms of physical distress.       Intensity THRR unchanged               Progression     Progression Continue to progress workloads to maintain intensity without signs/symptoms of  physical distress.               Resistance Training     Training Prescription Yes       Weight 3 lbs       Reps 10-15       Time 10 Minutes               NuStep     Level 1       SPM 80       Minutes 22       METs 1.92               Recumbant Elliptical     Level 1       RPM 49  Minutes 17       METs 2.2               Exercise Comments:   Exercise Goals and Review:   Exercise Goals     Row Name 11/16/20 1431 11/26/20 1257           Exercise Goals   Increase Physical Activity Yes Yes      Intervention Provide advice, education, support and counseling about physical activity/exercise needs.;Develop an individualized exercise prescription for aerobic and resistive training based on initial evaluation findings, risk stratification, comorbidities and participant's personal goals. Provide advice, education, support and counseling about physical activity/exercise needs.;Develop an individualized exercise prescription for aerobic and resistive training based on initial evaluation findings, risk stratification, comorbidities and participant's personal goals.      Expected Outcomes Short Term: Attend rehab on a regular basis to increase amount of physical activity.;Long Term: Exercising regularly at least 3-5 days a week.;Long Term: Add in home exercise to make exercise part of routine and to increase amount of physical activity. Short Term: Attend rehab on a regular basis to increase amount of physical activity.;Long Term: Exercising regularly at least 3-5 days a week.;Long Term: Add in home exercise to make exercise part of routine and to increase amount of physical activity.      Increase Strength and Stamina Yes Yes      Intervention Provide advice, education, support and counseling about physical activity/exercise needs.;Develop an individualized exercise prescription for aerobic and resistive training based on initial evaluation findings, risk stratification,  comorbidities and participant's personal goals. Provide advice, education, support and counseling about physical activity/exercise needs.;Develop an individualized exercise prescription for aerobic and resistive training based on initial evaluation findings, risk stratification, comorbidities and participant's personal goals.      Expected Outcomes Short Term: Increase workloads from initial exercise prescription for resistance, speed, and METs.;Short Term: Perform resistance training exercises routinely during rehab and add in resistance training at home;Long Term: Improve cardiorespiratory fitness, muscular endurance and strength as measured by increased METs and functional capacity ( ) Short Term: Increase workloads from initial exercise prescription for resistance, speed, and METs.;Short Term: Perform resistance training exercises routinely during rehab and add in resistance training at home;Long Term: Improve cardiorespiratory fitness, muscular endurance and strength as measured by increased METs and functional capacity ( )      Able to understand and use rate of perceived exertion (RPE) scale Yes Yes      Intervention Provide education and explanation on how to use RPE scale Provide education and explanation on how to use RPE scale      Expected Outcomes Short Term: Able to use RPE daily in rehab to express subjective intensity level;Long Term:  Able to use RPE to guide intensity level when exercising independently Short Term: Able to use RPE daily in rehab to express subjective intensity level;Long Term:  Able to use RPE to guide intensity level when exercising independently      Knowledge and understanding of Target Heart Rate Range (THRR) Yes Yes      Intervention Provide education and explanation of THRR including how the numbers were predicted and where they are located for reference Provide education and explanation of THRR including how the numbers were predicted and where they are located  for reference      Expected Outcomes Short Term: Able to state/look up THRR;Long Term: Able to use THRR to govern intensity when exercising independently;Short Term: Able to use daily as guideline for intensity in  rehab Short Term: Able to state/look up THRR;Long Term: Able to use THRR to govern intensity when exercising independently;Short Term: Able to use daily as guideline for intensity in rehab      Able to check pulse independently Yes Yes      Intervention Provide education and demonstration on how to check pulse in carotid and radial arteries.;Review the importance of being able to check your own pulse for safety during independent exercise Provide education and demonstration on how to check pulse in carotid and radial arteries.;Review the importance of being able to check your own pulse for safety during independent exercise      Expected Outcomes Short Term: Able to explain why pulse checking is important during independent exercise Short Term: Able to explain why pulse checking is important during independent exercise      Understanding of Exercise Prescription Yes Yes      Intervention Provide education, explanation, and written materials on patient's individual exercise prescription Provide education, explanation, and written materials on patient's individual exercise prescription      Expected Outcomes Short Term: Able to explain program exercise prescription;Long Term: Able to explain home exercise prescription to exercise independently Short Term: Able to explain program exercise prescription;Long Term: Able to explain home exercise prescription to exercise independently               Exercise Goals Re-Evaluation :  Exercise Goals Re-Evaluation     Row Name 11/26/20 1257             Exercise Goal Re-Evaluation   Exercise Goals Review Increase Physical Activity;Increase Strength and Stamina;Able to understand and use rate of perceived exertion (RPE) scale;Knowledge and  understanding of Target Heart Rate Range (THRR);Able to check pulse independently;Understanding of Exercise Prescription       Comments Pt has attended 3 sessions of cardiac rehab. He has been unable to do the treadmill as planned due to gout effecting one of his feet. He has a positive outlook and will likely progress well through the program. He is currently exercising at 2.2 METs on the elliptical. Will continue to monitor and progress as able.       Expected Outcomes Through exercise at rehab and at home, the patient will meet their stated goals.                 Discharge Exercise Prescription (Final Exercise Prescription Changes):  Exercise Prescription Changes - 11/26/20 1200       Response to Exercise   Blood Pressure (Admit) 128/66    Blood Pressure (Exercise) 128/56    Blood Pressure (Exit) 136/70    Heart Rate (Admit) 92 bpm    Heart Rate (Exercise) 111 bpm    Heart Rate (Exit) 101 bpm    Rating of Perceived Exertion (Exercise) 12    Duration Continue with 30 min of aerobic exercise without signs/symptoms of physical distress.    Intensity THRR unchanged      Progression   Progression Continue to progress workloads to maintain intensity without signs/symptoms of physical distress.      Resistance Training   Training Prescription Yes    Weight 3 lbs    Reps 10-15    Time 10 Minutes      NuStep   Level 1    SPM 80    Minutes 22    METs 1.92      Recumbant Elliptical   Level 1    RPM 49    Minutes 17  METs 2.2             Nutrition:  Target Goals: Understanding of nutrition guidelines, daily intake of sodium 1500mg , cholesterol 200mg , calories 30% from fat and 7% or less from saturated fats, daily to have 5 or more servings of fruits and vegetables.  Biometrics:  Pre Biometrics - 11/16/20 1431       Pre Biometrics   Height 6\' 1"  (1.854 m)    Weight 113.9 kg    Waist Circumference 45.5 inches    Hip Circumference 44 inches    Waist to Hip  Ratio 1.03 %    BMI (Calculated) 33.14    Triceps Skinfold 13 mm    % Body Fat 31.1 %    Grip Strength 43.6 kg    Flexibility 19 in    Single Leg Stand 17.58 seconds              Nutrition Therapy Plan and Nutrition Goals:  Nutrition Therapy & Goals - 11/19/20 1416       Personal Nutrition Goals   Comments Patient scored 44 on his diet assessment. We offer 2 educational sessions on heart healthy nutrition with handouts.      Intervention Plan   Intervention Nutrition handout(s) given to patient.             Nutrition Assessments:  Nutrition Assessments - 11/16/20 1305       MEDFICTS Scores   Pre Score 44            MEDIFICTS Score Key: ?70 Need to make dietary changes  40-70 Heart Healthy Diet ? 40 Therapeutic Level Cholesterol Diet   Picture Your Plate Scores: <78 Unhealthy dietary pattern with much room for improvement. 41-50 Dietary pattern unlikely to meet recommendations for good health and room for improvement. 51-60 More healthful dietary pattern, with some room for improvement.  >60 Healthy dietary pattern, although there may be some specific behaviors that could be improved.    Nutrition Goals Re-Evaluation:   Nutrition Goals Discharge (Final Nutrition Goals Re-Evaluation):   Psychosocial: Target Goals: Acknowledge presence or absence of significant depression and/or stress, maximize coping skills, provide positive support system. Participant is able to verbalize types and ability to use techniques and skills needed for reducing stress and depression.  Initial Review & Psychosocial Screening:  Initial Psych Review & Screening - 11/16/20 1303       Initial Review   Current issues with None Identified      Family Dynamics   Good Support System? Yes    Comments His wife is his main support system. He also goes to [employer] where he has made a lot of friends.      Barriers   Psychosocial barriers to participate in program There  are no identifiable barriers or psychosocial needs.      Screening Interventions   Interventions Encouraged to exercise    Expected Outcomes Long Term goal: The participant improves quality of Life and PHQ9 Scores as seen by post scores and/or verbalization of changes;Short Term goal: Identification and review with participant of any Quality of Life or Depression concerns found by scoring the questionnaire.             Quality of Life Scores:  Quality of Life - 11/16/20 1432       Quality of Life   Select Quality of Life      Quality of Life Scores   Health/Function Pre 23.13 %    Socioeconomic  Pre 25.93 %    Psych/Spiritual Pre 28.33 %    Family Pre 29.5 %    GLOBAL Pre 25.64 %            Scores of 19 and below usually indicate a poorer quality of life in these areas.  A difference of  2-3 points is a clinically meaningful difference.  A difference of 2-3 points in the total score of the Quality of Life Index has been associated with significant improvement in overall quality of life, self-image, physical symptoms, and general health in studies assessing change in quality of life.  PHQ-9: Recent Review Flowsheet Data     Depression screen [facility] 2/9 11/16/2020 12/21/2019   Decreased Interest 0 0   Down, Depressed, Hopeless 0 0   PHQ - 2 Score 0 0   Altered sleeping 1 -   Tired, decreased energy 1 -   Change in appetite 0 -   Feeling bad or failure about yourself  0 -   Trouble concentrating 0 -   Moving slowly or fidgety/restless 0 -   Suicidal thoughts 0 -   PHQ-9 Score 2 -   Difficult doing work/chores Somewhat difficult -      Interpretation of Total Score  Total Score Depression Severity:  1-4 = Minimal depression, 5-9 = Mild depression, 10-14 = Moderate depression, 15-19 = Moderately severe depression, 20-27 = Severe depression   Psychosocial Evaluation and Intervention:  Psychosocial Evaluation - 11/16/20 1433       Psychosocial Evaluation & Interventions    Interventions Encouraged to exercise with the program and follow exercise prescription    Comments Pt has no barriers to completing cardiac rehab. Pt has no psychosocial issues. He does smoke marijuana daily, but states that he has since he was 22 and has lived a successful life. He score a 2 on his PHQ-9. He does have problems staying asleep, and that does make him feel fatigued some days. He reports that he has been SOB since his CABG surgery, and he has been unable to exercise due to this. He used to live a very active lifestyle, hiking upwards of 50 miles per week. He has a good support system with his wife and friends at [employer] in [place]. His goals for the program are to lose weight and to decrease his SOB. He is enthusiastic about the program and eager to start.    Expected Outcomes The patient will continue to not have any identifiable psychosocial issues.    Continue Psychosocial Services  No Follow up required             Psychosocial Re-Evaluation:  Psychosocial Re-Evaluation     Row Name 11/19/20 1419             Psychosocial Re-Evaluation   Current issues with None Identified       Comments Patient is new to the program. He continues to have no psychosocial barriers identified. He is using Restoril for sleep. We will continue to monitor his progress.       Expected Outcomes Patient will complete the program meeting both personal and program goals.       Interventions Stress management education;Relaxation education;Encouraged to attend Cardiac Rehabilitation for the exercise       Continue Psychosocial Services  No Follow up required                Psychosocial Discharge (Final Psychosocial Re-Evaluation):  Psychosocial Re-Evaluation - 11/19/20 1419  Psychosocial Re-Evaluation   Current issues with None Identified    Comments Patient is new to the program. He continues to have no psychosocial barriers identified. He is using Restoril for sleep. We  will continue to monitor his progress.    Expected Outcomes Patient will complete the program meeting both personal and program goals.    Interventions Stress management education;Relaxation education;Encouraged to attend Cardiac Rehabilitation for the exercise    Continue Psychosocial Services  No Follow up required             Vocational Rehabilitation: Provide vocational rehab assistance to qualifying candidates.   Vocational Rehab Evaluation & Intervention:  Vocational Rehab - 11/16/20 1309       Initial Vocational Rehab Evaluation & Intervention   Assessment shows need for Vocational Rehabilitation No             Education: Education Goals: Education classes will be provided on a weekly basis, covering required topics. Participant will state understanding/return demonstration of topics presented.  Learning Barriers/Preferences:  Learning Barriers/Preferences - 11/16/20 1307       Learning Barriers/Preferences   Learning Barriers None    Learning Preferences Written Material;Skilled Demonstration             Education Topics: Hypertension, Hypertension Reduction -Define heart disease and high blood pressure. Discus how high blood pressure affects the body and ways to reduce high blood pressure.   Exercise and Your Heart -Discuss why it is important to exercise, the FITT principles of exercise, normal and abnormal responses to exercise, and how to exercise safely.   Angina -Discuss definition of angina, causes of angina, treatment of angina, and how to decrease risk of having angina.   Cardiac Medications -Review what the following cardiac medications are used for, how they affect the body, and side effects that may occur when taking the medications.  Medications include Aspirin, Beta blockers, calcium channel blockers, ACE Inhibitors, angiotensin receptor blockers, diuretics, digoxin, and antihyperlipidemics.   Congestive Heart Failure -Discuss the  definition of CHF, how to live with CHF, the signs and symptoms of CHF, and how keep track of weight and sodium intake.   Heart Disease and Intimacy -Discus the effect sexual activity has on the heart, how changes occur during intimacy as we age, and safety during sexual activity.   Smoking Cessation / COPD -Discuss different methods to quit smoking, the health benefits of quitting smoking, and the definition of COPD.   Nutrition I: Fats -Discuss the types of cholesterol, what cholesterol does to the heart, and how cholesterol levels can be controlled.   Nutrition II: Labels -Discuss the different components of food labels and how to read food label   Heart Parts/Heart Disease and PAD -Discuss the anatomy of the heart, the pathway of blood circulation through the heart, and these are affected by heart disease.   Stress I: Signs and Symptoms -Discuss the causes of stress, how stress may lead to anxiety and depression, and ways to limit stress. Flowsheet Row CARDIAC REHAB PHASE II EXERCISE from 11/21/2020 in [place] CARDIAC REHABILITATION  Date 11/21/20  Educator DF  Instruction Review Code 2- Demonstrated Understanding       Stress II: Relaxation -Discuss different types of relaxation techniques to limit stress.   Warning Signs of Stroke / TIA -Discuss definition of a stroke, what the signs and symptoms are of a stroke, and how to identify when someone is having stroke.   Knowledge Questionnaire Score:  Knowledge Questionnaire Score -  11/16/20 1308       Knowledge Questionnaire Score   Pre Score 23/24             Core Components/Risk Factors/Patient Goals at Admission:  Personal Goals and Risk Factors at Admission - 11/16/20 1314       Core Components/Risk Factors/Patient Goals on Admission    Weight Management Yes;Weight Loss    Intervention Weight Management: Develop a combined nutrition and exercise program designed to reach desired caloric intake,  while maintaining appropriate intake of nutrient and fiber, sodium and fats, and appropriate energy expenditure required for the weight goal.;Weight Management: Provide education and appropriate resources to help participant work on and attain dietary goals.;Weight Management/Obesity: Establish reasonable short term and long term weight goals.;Obesity: Provide education and appropriate resources to help participant work on and attain dietary goals.    Expected Outcomes Short Term: Continue to assess and modify interventions until short term weight is achieved;Long Term: Adherence to nutrition and physical activity/exercise program aimed toward attainment of established weight goal;Weight Maintenance: Understanding of the daily nutrition guidelines, which includes 25-35% calories from fat, 7% or less cal from saturated fats, less than  cholesterol, less than 1.5gm of sodium, & 5 or more servings of fruits and vegetables daily;Weight Loss: Understanding of general recommendations for a balanced deficit meal plan, which promotes 1-2 lb weight loss per week and includes a negative energy balance of [phone] kcal/d;Understanding recommendations for meals to include 15-35% energy as protein, 25-35% energy from fat, 35-60% energy from carbohydrates, less than  of dietary cholesterol, 20-35 gm of total fiber daily;Understanding of distribution of calorie intake throughout the day with the consumption of 4-5 meals/snacks    Improve shortness of breath with ADL's Yes    Intervention Provide education, individualized exercise plan and daily activity instruction to help decrease symptoms of SOB with activities of daily living.    Expected Outcomes Short Term: Improve cardiorespiratory fitness to achieve a reduction of symptoms when performing ADLs;Long Term: Be able to perform more ADLs without symptoms or delay the onset of symptoms    Diabetes Yes    Intervention Provide education about signs/symptoms and  action to take for hypo/hyperglycemia.;Provide education about proper nutrition, including hydration, and aerobic/resistive exercise prescription along with prescribed medications to achieve blood glucose in normal ranges: Fasting glucose 65-99 mg/dL    Expected Outcomes Long Term: Attainment of HbA1C < 7%.;Short Term: Participant verbalizes understanding of the signs/symptoms and immediate care of hyper/hypoglycemia, proper foot care and importance of medication, aerobic/resistive exercise and nutrition plan for blood glucose control.    Hypertension Yes    Intervention Provide education on lifestyle modifcations including regular physical activity/exercise, weight management, moderate sodium restriction and increased consumption of fresh fruit, vegetables, and low fat dairy, alcohol moderation, and smoking cessation.;Monitor prescription use compliance.    Expected Outcomes Short Term: Continued assessment and intervention until BP is < 140/71mm HG in hypertensive participants. < 130/67mm HG in hypertensive participants with diabetes, heart failure or chronic kidney disease.;Long Term: Maintenance of blood pressure at goal levels.             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 He has  not started the program. He has multiple risk factors for CAD and is doing the program for risk modification. His personal goals for the program are to lose weight; decrease SOB and get back to hsi active lifestyle. We will monitor his progress as he works towards meeting these goals.       Expected Outcomes Patient will complete the program meeting both personal and program goals.                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 He has not started the program. He has multiple risk factors for CAD and is doing the program for risk modification. His personal goals for the program are to lose weight; decrease SOB and get back to hsi active lifestyle. We will monitor his progress as he works towards meeting these goals.    Expected Outcomes Patient will complete the program meeting both personal and program goals.             ITP Comments:   Comments: ITP REVIEW Pt is making expected progress toward Cardiac Rehab goals after completing 3 sessions. Recommend continued exercise, life style modification, education, and increased stamina and strength.

## 2020-11-29 DIAGNOSIS — E1165 Type 2 diabetes mellitus with hyperglycemia: Secondary | ICD-10-CM | POA: Diagnosis not present

## 2020-11-30 ENCOUNTER — Encounter (HOSPITAL_COMMUNITY): Payer: Medicare Other

## 2020-12-03 ENCOUNTER — Other Ambulatory Visit: Payer: Self-pay

## 2020-12-03 ENCOUNTER — Encounter (HOSPITAL_COMMUNITY)
Admission: RE | Admit: 2020-12-03 | Discharge: 2020-12-03 | Disposition: A | Discharge status: Home / Self Care | Payer: Medicare Other | Source: Ambulatory Visit | Attending: Cardiology | Admitting: Cardiology

## 2020-12-03 DIAGNOSIS — Z951 Presence of aortocoronary bypass graft: Secondary | ICD-10-CM

## 2020-12-03 DIAGNOSIS — I214 Non-ST elevation (NSTEMI) myocardial infarction: Secondary | ICD-10-CM

## 2020-12-03 NOTE — Progress Notes (Signed)
Daily Session Note  Patient Details  Name: Russell Morrow MRN: [id number] Date of Birth: 04/08/1951 Referring Provider:   Flowsheet Row CARDIAC REHAB PHASE II ORIENTATION from 11/16/2020 in [place]  Referring Provider Dr. Harl Bowie       Encounter Date: 12/03/2020  Check In:  Session Check In - 12/03/20 1100       Check-In   Supervising physician immediately available to respond to emergencies [facility] MD immediately available    Physician(s) Dr. Domenic Polite    Location [facility]    Staff Present Hoy Register, MS, ACSM-CEP, Exercise Physiologist;Debra Wynetta Emery, RN, BSN    Virtual Visit No    Medication changes reported     No    Fall or balance concerns reported    No    Tobacco Cessation No Change    Warm-up and Cool-down Performed as group-led instruction    Resistance Training Performed Yes    VAD Patient? No    PAD/SET Patient? No      Pain Assessment   Currently in Pain? No/denies    Multiple Pain Sites No             Capillary Blood Glucose: No results found for this or any previous visit (from the past 24 hour(s)).    Social History   Tobacco Use  Smoking Status Never  Smokeless Tobacco Never  Tobacco Comments   since age 61 - marijuana     Goals Met:  Independence with exercise equipment Exercise tolerated well No report of cardiac concerns or symptoms Strength training completed today  Goals Unmet:  Not Applicable  Comments: checkout time is 1200   Dr. Kathie Dike is Medical Director for [facility].

## 2020-12-04 ENCOUNTER — Ambulatory Visit: Payer: Medicare Other | Admitting: Family Medicine

## 2020-12-05 ENCOUNTER — Encounter (HOSPITAL_COMMUNITY): Payer: Medicare Other

## 2020-12-07 ENCOUNTER — Encounter (HOSPITAL_COMMUNITY): Payer: Medicare Other

## 2020-12-10 ENCOUNTER — Encounter (HOSPITAL_COMMUNITY): Payer: Medicare Other

## 2020-12-12 ENCOUNTER — Encounter (HOSPITAL_COMMUNITY): Payer: Medicare Other

## 2020-12-14 ENCOUNTER — Encounter (HOSPITAL_COMMUNITY): Payer: Medicare Other

## 2020-12-17 ENCOUNTER — Encounter (HOSPITAL_COMMUNITY): Payer: Medicare Other

## 2020-12-19 ENCOUNTER — Encounter (HOSPITAL_COMMUNITY): Payer: Medicare Other

## 2020-12-20 DIAGNOSIS — E782 Mixed hyperlipidemia: Secondary | ICD-10-CM | POA: Diagnosis not present

## 2020-12-20 DIAGNOSIS — E119 Type 2 diabetes mellitus without complications: Secondary | ICD-10-CM | POA: Diagnosis not present

## 2020-12-21 ENCOUNTER — Encounter (HOSPITAL_COMMUNITY): Payer: Medicare Other

## 2020-12-24 ENCOUNTER — Encounter (HOSPITAL_COMMUNITY): Payer: Medicare Other

## 2020-12-25 ENCOUNTER — Other Ambulatory Visit: Payer: Self-pay | Admitting: *Deleted

## 2020-12-25 DIAGNOSIS — I10 Essential (primary) hypertension: Secondary | ICD-10-CM | POA: Diagnosis not present

## 2020-12-25 DIAGNOSIS — I2510 Atherosclerotic heart disease of native coronary artery without angina pectoris: Secondary | ICD-10-CM | POA: Diagnosis not present

## 2020-12-25 DIAGNOSIS — M791 Myalgia, unspecified site: Secondary | ICD-10-CM | POA: Diagnosis not present

## 2020-12-25 DIAGNOSIS — G2581 Restless legs syndrome: Secondary | ICD-10-CM | POA: Diagnosis not present

## 2020-12-25 DIAGNOSIS — E782 Mixed hyperlipidemia: Secondary | ICD-10-CM | POA: Diagnosis not present

## 2020-12-25 DIAGNOSIS — M1990 Unspecified osteoarthritis, unspecified site: Secondary | ICD-10-CM | POA: Diagnosis not present

## 2020-12-25 DIAGNOSIS — E1165 Type 2 diabetes mellitus with hyperglycemia: Secondary | ICD-10-CM | POA: Diagnosis not present

## 2020-12-25 DIAGNOSIS — D649 Anemia, unspecified: Secondary | ICD-10-CM | POA: Diagnosis not present

## 2020-12-25 DIAGNOSIS — Z0001 Encounter for general adult medical examination with abnormal findings: Secondary | ICD-10-CM | POA: Diagnosis not present

## 2020-12-25 DIAGNOSIS — G4700 Insomnia, unspecified: Secondary | ICD-10-CM | POA: Diagnosis not present

## 2020-12-25 DIAGNOSIS — E870 Hyperosmolality and hypernatremia: Secondary | ICD-10-CM | POA: Diagnosis not present

## 2020-12-26 ENCOUNTER — Encounter (HOSPITAL_COMMUNITY): Payer: Medicare Other

## 2020-12-26 DIAGNOSIS — G720 Drug-induced myopathy: Secondary | ICD-10-CM | POA: Insufficient documentation

## 2020-12-26 NOTE — Progress Notes (Signed)
Cardiac Individual Treatment Plan  Patient Details  Name: Russell Morrow MRN: [id number] Date of Birth: September 15, 1950 Referring Provider:   Flowsheet Row CARDIAC REHAB PHASE II ORIENTATION from 11/16/2020 in [facility] CARDIAC REHABILITATION  Referring Provider Dr. Wyline Mood       Initial Encounter Date:  Flowsheet Row CARDIAC REHAB PHASE II ORIENTATION from 11/16/2020 in [facility] [place] CARDIAC REHABILITATION  Date 11/16/20       Visit Diagnosis: S/P CABG x 2  NSTEMI (non-ST elevated myocardial infarction) (HCC)  Patient's Home Medications on Admission:  Current Outpatient Medications:    acetaminophen (TYLENOL) 500 MG tablet, Take 1,000 mg by mouth every 6 (six) hours as needed for moderate pain. , Disp: , Rfl:    amLODipine (NORVASC) 10 MG tablet, Take 10 mg by mouth daily., Disp: , Rfl:    aspirin EC 81 MG EC tablet, Take 1 tablet (81 mg total) by mouth daily., Disp: , Rfl:    BD PEN NEEDLE NANO U/F 32G X 4 MM MISC, Inject into the skin., Disp: , Rfl:    diclofenac Sodium (VOLTAREN) 1 % GEL, Apply 2 g topically 4 (four) times daily as needed (pain)., Disp: , Rfl:    dicyclomine (BENTYL) 10 MG capsule, Take 10 mg by mouth at bedtime as needed for spasms., Disp: , Rfl:    esomeprazole (NEXIUM) 20 MG capsule, Take 20 mg by mouth daily as needed (acid reflux)., Disp: , Rfl:    ezetimibe (ZETIA) 10 MG tablet, Take 10 mg by mouth daily., Disp: , Rfl:    FEROSUL 325 (65 Fe) MG tablet, Take 325 mg by mouth 2 (two) times daily. , Disp: , Rfl:    furosemide (LASIX) 40 MG tablet, Take 1 tablet (40 mg total) by mouth daily., Disp: 7 tablet, Rfl: 0   HUMALOG KWIKPEN 100 UNIT/ML KwikPen, Inject 10-16 Units into the skin with breakfast, with lunch, and with evening meal. Sliding Scale:  CBG 140-180 = 10 units, 181-220 = 12 units, 221-260 = 14 units, 261-300 = 16 units , call MD over 300, Disp: , Rfl:    LANTUS SOLOSTAR 100 UNIT/ML Solostar Pen, Inject 35 Units into the skin 2 (two) times daily. (Patient  taking differently: Inject 40 Units into the skin 2 (two) times daily.), Disp: 15 mL, Rfl: 11   metoprolol succinate (TOPROL-XL) 50 MG 24 hr tablet, Take 1 tablet (50 mg total) by mouth daily. Take with or immediately following a meal., Disp: 90 tablet, Rfl: 3   olmesartan (BENICAR) 40 MG tablet, Take 1 tablet (40 mg total) by mouth daily., Disp: 90 tablet, Rfl: 3   potassium chloride SA (KLOR-CON) 20 MEQ tablet, Take 1 tablet (20 mEq total) by mouth 2 (two) times daily., Disp: 7 tablet, Rfl: 0   pregabalin (LYRICA) 100 MG capsule, Take 100 mg by mouth at bedtime., Disp: , Rfl:    rOPINIRole (REQUIP) 1 MG tablet, Take 3 mg by mouth at bedtime., Disp: , Rfl:    simethicone (MYLICON) 125 MG chewable tablet, Chew 125 mg by mouth every 6 (six) hours as needed for flatulence., Disp: , Rfl:    spironolactone (ALDACTONE) 25 MG tablet, Take 25 mg by mouth daily., Disp: , Rfl:    tadalafil (CIALIS) 10 MG tablet, Take 10 mg by mouth daily as needed for erectile dysfunction. , Disp: , Rfl:    temazepam (RESTORIL) 15 MG capsule, Take 15 mg by mouth at bedtime., Disp: , Rfl:    TRULICITY 1.5 MG/0.5ML SOPN, Inject 1.5  mg into the skin once a week., Disp: , Rfl:   Past Medical History: Past Medical History:  Diagnosis Date   CAD (coronary artery disease)    a. mild nonobstructive CAD by cath in 2010 b. NST in 11/2015 showing a fixed defect with no ischemia and Coronary CT showing nonobstructive CAD along LAD and LCx c. 08/2018: s/p NSTEMI with cath showing acute total occlusion of LCx (treated with DES) and nonobstructive CAD along RCA and LAD.    Diabetes mellitus without complication (HCC)    Diverticulitis    Gallstones    Hypertension    IDA (iron deficiency anemia)    MI (myocardial infarction) (HCC)    PSVT (paroxysmal supraventricular tachycardia) (HCC)    Restless leg syndrome    Sleep apnea     Tobacco Use: Social History   Tobacco Use  Smoking Status Never  Smokeless Tobacco Never   Tobacco Comments   since age 9 - marijuana     Labs: Recent Review Flowsheet Data     Labs for ITP Cardiac and Pulmonary Rehab Latest Ref Rng & Units 10/10/2020 10/10/2020 10/10/2020 10/10/2020 10/10/2020   Cholestrol 0 - 200 mg/dL - - - - -   LDLCALC 0 - 99 mg/dL - - - - -   HDL >75 mg/dL - - - - -   Trlycerides <150 mg/dL - - - - -   Hemoglobin A1c 4.8 - 5.6 % - - - - -   PHART 7.350 - 7.450 7.299(L) - 7.329(L) 7.400 7.394   PCO2ART 32.0 - 48.0 mmHg 58.9(H) - 46.7 41.9 38.5   HCO3 20.0 - 28.0 mmol/L 28.9(H) - 24.8 25.9 23.5   TCO2 22 - 32 mmol/L 31 27 26 27 25    ACIDBASEDEF 0.0 - 2.0 mmol/L - - 2.0 - 1.0   O2SAT % 99.0 - 89.0 99.0 95.0       Capillary Blood Glucose: Lab Results  Component Value Date   GLUCAP 157 (H) 10/15/2020   GLUCAP 205 (H) 10/14/2020   GLUCAP 207 (H) 10/14/2020   GLUCAP 193 (H) 10/14/2020   GLUCAP 144 (H) 10/14/2020    POCT Glucose     Row Name 11/16/20 1325             POCT Blood Glucose   Pre-Exercise 168 mg/dL                Exercise Target Goals: Exercise Program Goal: Individual exercise prescription set using results from initial 6 min walk test and THRR while considering  patient's activity barriers and safety.   Exercise Prescription Goal: Starting with aerobic activity 30 plus minutes a day, 3 days per week for initial exercise prescription. Provide home exercise prescription and guidelines that participant acknowledges understanding prior to discharge.  Activity Barriers & Risk Stratification:  Activity Barriers & Cardiac Risk Stratification - 11/16/20 1302       Activity Barriers & Cardiac Risk Stratification   Activity Barriers Arthritis;Neck/Spine Problems;Deconditioning;Shortness of Breath;Balance Concerns;History of Falls    Cardiac Risk Stratification High             6 Minute Walk:  6 Minute Walk     Row Name 11/16/20 1427         6 Minute Walk   Phase Initial     Distance 1275 feet     Walk Time 6  minutes     # of Rest Breaks 0     MPH 2.41     METS  2.53     RPE 13     VO2 Peak 8.86     Symptoms Yes (comment)     Comments left hip tightness     Resting HR 87 bpm     Resting BP 108/62     Resting Oxygen Saturation  97 %     Exercise Oxygen Saturation  during 6 min walk 97 %     Max Ex. HR 100 bpm     Max Ex. BP 134/70     2 Minute Post BP 114/62              Oxygen Initial Assessment:   Oxygen Re-Evaluation:   Oxygen Discharge (Final Oxygen Re-Evaluation):   Initial Exercise Prescription:  Initial Exercise Prescription - 11/16/20 1400       Date of Initial Exercise RX and Referring Provider   Date 11/16/20    Referring Provider Dr. Wyline Mood    Expected Discharge Date 02/11/21      Treadmill   MPH 1.7    Grade 0    Minutes 17      Recumbant Elliptical   Level 1    RPM 60    Minutes 22      Prescription Details   Frequency (times per week) 3    Duration Progress to 30 minutes of continuous aerobic without signs/symptoms of physical distress      Intensity   THRR 40-80% of Max Heartrate 60-119    Ratings of Perceived Exertion 11-13    Perceived Dyspnea 0-4      Resistance Training   Training Prescription Yes    Weight 3 lbs    Reps 10-15             Perform Capillary Blood Glucose checks as needed.  Exercise Prescription Changes:   Exercise Prescription Changes     Row Name 11/26/20 1200 12/03/20 1126           Response to Exercise   Blood Pressure (Admit) 128/66 130/60      Blood Pressure (Exercise) 128/56 132/58      Blood Pressure (Exit) 136/70 116/60      Heart Rate (Admit) 92 bpm 94 bpm      Heart Rate (Exercise) 111 bpm 113 bpm      Heart Rate (Exit) 101 bpm 103 bpm      Rating of Perceived Exertion (Exercise) 12 13      Duration Continue with 30 min of aerobic exercise without signs/symptoms of physical distress. Continue with 30 min of aerobic exercise without signs/symptoms of physical distress.      Intensity THRR  unchanged THRR unchanged             Progression      Progression Continue to progress workloads to maintain intensity without signs/symptoms of physical distress. Continue to progress workloads to maintain intensity without signs/symptoms of physical distress.             Resistance Training      Training Prescription Yes Yes      Weight 3 lbs 4 lbs      Reps 10-15 10-15      Time 10 Minutes 10 Minutes             NuStep      Level 1 2      SPM 80 96      Minutes 22 22      METs 1.92 2.62  Recumbant Elliptical      Level 1 2      RPM 49 59      Minutes 17 17      METs 2.2 3              Exercise Comments:   Exercise Goals and Review:   Exercise Goals     Row Name 11/16/20 1431 11/26/20 1257           Exercise Goals   Increase Physical Activity Yes Yes      Intervention Provide advice, education, support and counseling about physical activity/exercise needs.;Develop an individualized exercise prescription for aerobic and resistive training based on initial evaluation findings, risk stratification, comorbidities and participant's personal goals. Provide advice, education, support and counseling about physical activity/exercise needs.;Develop an individualized exercise prescription for aerobic and resistive training based on initial evaluation findings, risk stratification, comorbidities and participant's personal goals.      Expected Outcomes Short Term: Attend rehab on a regular basis to increase amount of physical activity.;Long Term: Exercising regularly at least 3-5 days a week.;Long Term: Add in home exercise to make exercise part of routine and to increase amount of physical activity. Short Term: Attend rehab on a regular basis to increase amount of physical activity.;Long Term: Exercising regularly at least 3-5 days a week.;Long Term: Add in home exercise to make exercise part of routine and to increase amount of physical activity.      Increase  Strength and Stamina Yes Yes      Intervention Provide advice, education, support and counseling about physical activity/exercise needs.;Develop an individualized exercise prescription for aerobic and resistive training based on initial evaluation findings, risk stratification, comorbidities and participant's personal goals. Provide advice, education, support and counseling about physical activity/exercise needs.;Develop an individualized exercise prescription for aerobic and resistive training based on initial evaluation findings, risk stratification, comorbidities and participant's personal goals.      Expected Outcomes Short Term: Increase workloads from initial exercise prescription for resistance, speed, and METs.;Short Term: Perform resistance training exercises routinely during rehab and add in resistance training at home;Long Term: Improve cardiorespiratory fitness, muscular endurance and strength as measured by increased METs and functional capacity ( ) Short Term: Increase workloads from initial exercise prescription for resistance, speed, and METs.;Short Term: Perform resistance training exercises routinely during rehab and add in resistance training at home;Long Term: Improve cardiorespiratory fitness, muscular endurance and strength as measured by increased METs and functional capacity ( )      Able to understand and use rate of perceived exertion (RPE) scale Yes Yes      Intervention Provide education and explanation on how to use RPE scale Provide education and explanation on how to use RPE scale      Expected Outcomes Short Term: Able to use RPE daily in rehab to express subjective intensity level;Long Term:  Able to use RPE to guide intensity level when exercising independently Short Term: Able to use RPE daily in rehab to express subjective intensity level;Long Term:  Able to use RPE to guide intensity level when exercising independently      Knowledge and understanding of Target Heart  Rate Range (THRR) Yes Yes      Intervention Provide education and explanation of THRR including how the numbers were predicted and where they are located for reference Provide education and explanation of THRR including how the numbers were predicted and where they are located for reference      Expected Outcomes Short Term: Able to state/look  up THRR;Long Term: Able to use THRR to govern intensity when exercising independently;Short Term: Able to use daily as guideline for intensity in rehab Short Term: Able to state/look up THRR;Long Term: Able to use THRR to govern intensity when exercising independently;Short Term: Able to use daily as guideline for intensity in rehab      Able to check pulse independently Yes Yes      Intervention Provide education and demonstration on how to check pulse in carotid and radial arteries.;Review the importance of being able to check your own pulse for safety during independent exercise Provide education and demonstration on how to check pulse in carotid and radial arteries.;Review the importance of being able to check your own pulse for safety during independent exercise      Expected Outcomes Short Term: Able to explain why pulse checking is important during independent exercise Short Term: Able to explain why pulse checking is important during independent exercise      Understanding of Exercise Prescription Yes Yes      Intervention Provide education, explanation, and written materials on patient's individual exercise prescription Provide education, explanation, and written materials on patient's individual exercise prescription      Expected Outcomes Short Term: Able to explain program exercise prescription;Long Term: Able to explain home exercise prescription to exercise independently Short Term: Able to explain program exercise prescription;Long Term: Able to explain home exercise prescription to exercise independently               Exercise Goals  Re-Evaluation :  Exercise Goals Re-Evaluation     Row Name 11/26/20 1257             Exercise Goal Re-Evaluation   Exercise Goals Review Increase Physical Activity;Increase Strength and Stamina;Able to understand and use rate of perceived exertion (RPE) scale;Knowledge and understanding of Target Heart Rate Range (THRR);Able to check pulse independently;Understanding of Exercise Prescription       Comments Pt has attended 3 sessions of cardiac rehab. He has been unable to do the treadmill as planned due to gout effecting one of his feet. He has a positive outlook and will likely progress well through the program. He is currently exercising at 2.2 METs on the elliptical. Will continue to monitor and progress as able.       Expected Outcomes Through exercise at rehab and at home, the patient will meet their stated goals.                 Discharge Exercise Prescription (Final Exercise Prescription Changes):  Exercise Prescription Changes - 12/03/20 1126       Response to Exercise   Blood Pressure (Admit) 130/60    Blood Pressure (Exercise) 132/58    Blood Pressure (Exit) 116/60    Heart Rate (Admit) 94 bpm    Heart Rate (Exercise) 113 bpm    Heart Rate (Exit) 103 bpm    Rating of Perceived Exertion (Exercise) 13    Duration Continue with 30 min of aerobic exercise without signs/symptoms of physical distress.    Intensity THRR unchanged      Progression   Progression Continue to progress workloads to maintain intensity without signs/symptoms of physical distress.      Resistance Training   Training Prescription Yes    Weight 4 lbs    Reps 10-15    Time 10 Minutes      NuStep   Level 2    SPM 96    Minutes 22    METs  2.62      Recumbant Elliptical   Level 2    RPM 59    Minutes 17    METs 3             Nutrition:  Target Goals: Understanding of nutrition guidelines, daily intake of sodium 1500mg , cholesterol 200mg , calories 30% from fat and 7% or less from  saturated fats, daily to have 5 or more servings of fruits and vegetables.  Biometrics:  Pre Biometrics - 11/16/20 1431       Pre Biometrics   Height 6\' 1"  (1.854 m)    Weight 113.9 kg    Waist Circumference 45.5 inches    Hip Circumference 44 inches    Waist to Hip Ratio 1.03 %    BMI (Calculated) 33.14    Triceps Skinfold 13 mm    % Body Fat 31.1 %    Grip Strength 43.6 kg    Flexibility 19 in    Single Leg Stand 17.58 seconds              Nutrition Therapy Plan and Nutrition Goals:  Nutrition Therapy & Goals - 11/19/20 1416       Personal Nutrition Goals   Comments Patient scored 44 on his diet assessment. We offer 2 educational sessions on heart healthy nutrition with handouts.      Intervention Plan   Intervention Nutrition handout(s) given to patient.             Nutrition Assessments:  Nutrition Assessments - 11/16/20 1305       MEDFICTS Scores   Pre Score 44            MEDIFICTS Score Key: ?70 Need to make dietary changes  40-70 Heart Healthy Diet ? 40 Therapeutic Level Cholesterol Diet   Picture Your Plate Scores: 01/16/21 Unhealthy dietary pattern with much room for improvement. 41-50 Dietary pattern unlikely to meet recommendations for good health and room for improvement. 51-60 More healthful dietary pattern, with some room for improvement.  >60 Healthy dietary pattern, although there may be some specific behaviors that could be improved.    Nutrition Goals Re-Evaluation:   Nutrition Goals Discharge (Final Nutrition Goals Re-Evaluation):   Psychosocial: Target Goals: Acknowledge presence or absence of significant depression and/or stress, maximize coping skills, provide positive support system. Participant is able to verbalize types and ability to use techniques and skills needed for reducing stress and depression.  Initial Review & Psychosocial Screening:  Initial Psych Review & Screening - 11/16/20 1303       Initial Review    Current issues with None Identified      Family Dynamics   Good Support System? Yes    Comments His wife is his main support system. He also goes to [place]/16[place]21 where he has made a lot of friends.      Barriers   Psychosocial barriers to participate in program There are no identifiable barriers or psychosocial needs.      Screening Interventions   Interventions Encouraged to exercise    Expected Outcomes Long Term goal: The participant improves quality of Life and PHQ9 Scores as seen by post scores and/or verbalization of changes;Short Term goal: Identification and review with participant of any Quality of Life or Depression concerns found by scoring the questionnaire.             Quality of Life Scores:  Quality of Life - 11/16/20 1432       Quality of Life  Select Quality of Life      Quality of Life Scores   Health/Function Pre 23.13 %    Socioeconomic Pre 25.93 %    Psych/Spiritual Pre 28.33 %    Family Pre 29.5 %    GLOBAL Pre 25.64 %            Scores of 19 and below usually indicate a poorer quality of life in these areas.  A difference of  2-3 points is a clinically meaningful difference.  A difference of 2-3 points in the total score of the Quality of Life Index has been associated with significant improvement in overall quality of life, self-image, physical symptoms, and general health in studies assessing change in quality of life.  PHQ-9: Recent Review Flowsheet Data     Depression screen [facility] 2/9 11/16/2020 12/21/2019   Decreased Interest 0 0   Down, Depressed, Hopeless 0 0   PHQ - 2 Score 0 0   Altered sleeping 1 -   Tired, decreased energy 1 -   Change in appetite 0 -   Feeling bad or failure about yourself  0 -   Trouble concentrating 0 -   Moving slowly or fidgety/restless 0 -   Suicidal thoughts 0 -   PHQ-9 Score 2 -   Difficult doing work/chores Somewhat difficult -      Interpretation of Total Score  Total Score Depression Severity:   1-4 = Minimal depression, 5-9 = Mild depression, 10-14 = Moderate depression, 15-19 = Moderately severe depression, 20-27 = Severe depression   Psychosocial Evaluation and Intervention:  Psychosocial Evaluation - 11/16/20 1433       Psychosocial Evaluation & Interventions   Interventions Encouraged to exercise with the program and follow exercise prescription    Comments Pt has no barriers to completing cardiac rehab. Pt has no psychosocial issues. He does smoke marijuana daily, but states that he has since he was 24 and has lived a successful life. He score a 2 on his PHQ-9. He does have problems staying asleep, and that does make him feel fatigued some days. He reports that he has been SOB since his CABG surgery, and he has been unable to exercise due to this. He used to live a very active lifestyle, hiking upwards of 50 miles per week. He has a good support system with his wife and friends at [employer] in [place]. His goals for the program are to lose weight and to decrease his SOB. He is enthusiastic about the program and eager to start.    Expected Outcomes The patient will continue to not have any identifiable psychosocial issues.    Continue Psychosocial Services  No Follow up required             Psychosocial Re-Evaluation:  Psychosocial Re-Evaluation     Row Name 11/19/20 1419 12/17/20 1309           Psychosocial Re-Evaluation   Current issues with None Identified --      Comments Patient is new to the program. He continues to have no psychosocial barriers identified. He is using Restoril for sleep. We will continue to monitor his progress. Patient continues to have no psychosocial barriers identified. He has completed 4 sessions with insonsistent attendance. He is using Restoril for sleep. We will continue to monitor his progress.      Expected Outcomes Patient will complete the program meeting both personal and program goals. Patient will complete the program meeting  both personal and program  goals.      Interventions Stress management education;Relaxation education;Encouraged to attend Cardiac Rehabilitation for the exercise Stress management education;Relaxation education;Encouraged to attend Cardiac Rehabilitation for the exercise      Continue Psychosocial Services  No Follow up required No Follow up required               Psychosocial Discharge (Final Psychosocial Re-Evaluation):  Psychosocial Re-Evaluation - 12/17/20 1309       Psychosocial Re-Evaluation   Comments Patient continues to have no psychosocial barriers identified. He has completed 4 sessions with insonsistent attendance. He is using Restoril for sleep. We will continue to monitor his progress.    Expected Outcomes Patient will complete the program meeting both personal and program goals.    Interventions Stress management education;Relaxation education;Encouraged to attend Cardiac Rehabilitation for the exercise    Continue Psychosocial Services  No Follow up required             Vocational Rehabilitation: Provide vocational rehab assistance to qualifying candidates.   Vocational Rehab Evaluation & Intervention:  Vocational Rehab - 11/16/20 1309       Initial Vocational Rehab Evaluation & Intervention   Assessment shows need for Vocational Rehabilitation No             Education: Education Goals: Education classes will be provided on a weekly basis, covering required topics. Participant will state understanding/return demonstration of topics presented.  Learning Barriers/Preferences:  Learning Barriers/Preferences - 11/16/20 1307       Learning Barriers/Preferences   Learning Barriers None    Learning Preferences Written Material;Skilled Demonstration             Education Topics: Hypertension, Hypertension Reduction -Define heart disease and high blood pressure. Discus how high blood pressure affects the body and ways to reduce high blood  pressure.   Exercise and Your Heart -Discuss why it is important to exercise, the FITT principles of exercise, normal and abnormal responses to exercise, and how to exercise safely.   Angina -Discuss definition of angina, causes of angina, treatment of angina, and how to decrease risk of having angina.   Cardiac Medications -Review what the following cardiac medications are used for, how they affect the body, and side effects that may occur when taking the medications.  Medications include Aspirin, Beta blockers, calcium channel blockers, ACE Inhibitors, angiotensin receptor blockers, diuretics, digoxin, and antihyperlipidemics.   Congestive Heart Failure -Discuss the definition of CHF, how to live with CHF, the signs and symptoms of CHF, and how keep track of weight and sodium intake.   Heart Disease and Intimacy -Discus the effect sexual activity has on the heart, how changes occur during intimacy as we age, and safety during sexual activity.   Smoking Cessation / COPD -Discuss different methods to quit smoking, the health benefits of quitting smoking, and the definition of COPD.   Nutrition I: Fats -Discuss the types of cholesterol, what cholesterol does to the heart, and how cholesterol levels can be controlled.   Nutrition II: Labels -Discuss the different components of food labels and how to read food label   Heart Parts/Heart Disease and PAD -Discuss the anatomy of the heart, the pathway of blood circulation through the heart, and these are affected by heart disease.   Stress I: Signs and Symptoms -Discuss the causes of stress, how stress may lead to anxiety and depression, and ways to limit stress. Flowsheet Row CARDIAC REHAB PHASE II EXERCISE [facility]  Date  11/21/20  Educator DF  Instruction Review Code 2- Demonstrated Understanding       Stress II: Relaxation -Discuss different types of relaxation techniques to  limit stress.   Warning Signs of Stroke / TIA -Discuss definition of a stroke, what the signs and symptoms are of a stroke, and how to identify when someone is having stroke.   Knowledge Questionnaire Score:  Knowledge Questionnaire Score - 11/16/20 1308       Knowledge Questionnaire Score   Pre Score 23/24             Core Components/Risk Factors/Patient Goals at Admission:  Personal Goals and Risk Factors at Admission - 11/16/20 1314       Core Components/Risk Factors/Patient Goals on Admission    Weight Management Yes;Weight Loss    Intervention Weight Management: Develop a combined nutrition and exercise program designed to reach desired caloric intake, while maintaining appropriate intake of nutrient and fiber, sodium and fats, and appropriate energy expenditure required for the weight goal.;Weight Management: Provide education and appropriate resources to help participant work on and attain dietary goals.;Weight Management/Obesity: Establish reasonable short term and long term weight goals.;Obesity: Provide education and appropriate resources to help participant work on and attain dietary goals.    Expected Outcomes Short Term: Continue to assess and modify interventions until short term weight is achieved;Long Term: Adherence to nutrition and physical activity/exercise program aimed toward attainment of established weight goal;Weight Maintenance: Understanding of the daily nutrition guidelines, which includes 25-35% calories from fat, 7% or less cal from saturated fats, less than  cholesterol, less than 1.5gm of sodium, & 5 or more servings of fruits and vegetables daily;Weight Loss: Understanding of general recommendations for a balanced deficit meal plan, which promotes 1-2 lb weight loss per week and includes a negative energy balance of [phone] kcal/d;Understanding recommendations for meals to include 15-35% energy as protein, 25-35% energy from fat, 35-60% energy from  carbohydrates, less than  of dietary cholesterol, 20-35 gm of total fiber daily;Understanding of distribution of calorie intake throughout the day with the consumption of 4-5 meals/snacks    Improve shortness of breath with ADL's Yes    Intervention Provide education, individualized exercise plan and daily activity instruction to help decrease symptoms of SOB with activities of daily living.    Expected Outcomes Short Term: Improve cardiorespiratory fitness to achieve a reduction of symptoms when performing ADLs;Long Term: Be able to perform more ADLs without symptoms or delay the onset of symptoms    Diabetes Yes    Intervention Provide education about signs/symptoms and action to take for hypo/hyperglycemia.;Provide education about proper nutrition, including hydration, and aerobic/resistive exercise prescription along with prescribed medications to achieve blood glucose in normal ranges: Fasting glucose 65-99 mg/dL    Expected Outcomes Long Term: Attainment of HbA1C < 7%.;Short Term: Participant verbalizes understanding of the signs/symptoms and immediate care of hyper/hypoglycemia, proper foot care and importance of medication, aerobic/resistive exercise and nutrition plan for blood glucose control.    Hypertension Yes    Intervention Provide education on lifestyle modifcations including regular physical activity/exercise, weight management, moderate sodium restriction and increased consumption of fresh fruit, vegetables, and low fat dairy, alcohol moderation, and smoking cessation.;Monitor prescription use compliance.    Expected Outcomes Short Term: Continued assessment and intervention until BP is < 140/5mm HG in hypertensive participants. < 130/59mm HG in hypertensive participants with diabetes, heart failure or chronic kidney disease.;Long Term: Maintenance of blood pressure at goal levels.  Core Components/Risk Factors/Patient Goals Review:   Goals and Risk Factor Review      Row Name 11/19/20 1417 12/17/20 1302           Core Components/Risk Factors/Patient Goals Review   Personal Goals Review Improve shortness of breath with ADL's;Weight Management/Obesity;Diabetes Improve shortness of breath with ADL's;Weight Management/Obesity;Diabetes      Review Patient referred to CR with NSTEMI/CABGx2. He has not started the program. He has multiple risk factors for CAD and is doing the program for risk modification. His personal goals for the program are to lose weight; decrease SOB and get back to hsi active lifestyle. We will monitor his progress as he works towards meeting these goals. Patient has completed 4 sessions maintaining his weight since his initial visit. He has missed 2 weeks without calling. His last communication was regarding missing his first session due to placing his mother in an assisted living facility out of town. We are not sure if this is the reason for his absences. His personal goals for the program are to lose weight; stop dizziness/SOB and be able to return to doing yard work and his flower garden. We will continue to monitor.      Expected Outcomes Patient will complete the program meeting both personal and program goals. Patient will complete the program meeting both personal and program goals.               Core Components/Risk Factors/Patient Goals at Discharge (Final Review):   Goals and Risk Factor Review - 12/17/20 1302       Core Components/Risk Factors/Patient Goals Review   Personal Goals Review Improve shortness of breath with ADL's;Weight Management/Obesity;Diabetes    Review Patient has completed 4 sessions maintaining his weight since his initial visit. He has missed 2 weeks without calling. His last communication was regarding missing his first session due to placing his mother in an assisted living facility out of town. We are not sure if this is the reason for his absences. His personal goals for the program are to lose  weight; stop dizziness/SOB and be able to return to doing yard work and his flower garden. We will continue to monitor.    Expected Outcomes Patient will complete the program meeting both personal and program goals.             ITP Comments:  ITP Comments     Row Name 12/26/20 1233           ITP Comments Pt discharged from cardiac rehab after 4 visits. He seemed excited to get into the program at orientation, but his attendance was inconsistent with frequent no call no shows. Several times he asked to come to a later class and then he would not show up. Pt last attended rehab on 12/03/2020 and has not called out during any of the subsequent absences.                Comments: ITP REVIEW Pt is making expected progress toward Cardiac Rehab goals after completing 4 sessions. Recommend continued exercise, life style modification, education, and increased stamina and strength.

## 2020-12-26 NOTE — Progress Notes (Signed)
Discharge Progress Report  Patient Details  Name: Russell Morrow MRN: [id number] Date of Birth: February 12, 1951 Referring Provider:   Flowsheet Row CARDIAC REHAB PHASE II ORIENTATION from 11/16/2020 in [facility]  Referring Provider Dr. Wyline Mood        Number of Visits: 4  Reason for Discharge:  Early Exit:  Lack of attendance  Smoking History:  Social History   Tobacco Use  Smoking Status Never  Smokeless Tobacco Never  Tobacco Comments   since age 14 - marijuana     Diagnosis:  S/P CABG x 2  NSTEMI (non-ST elevated myocardial infarction) (HCC)  ADL UCSD:   Initial Exercise Prescription:  Initial Exercise Prescription - 11/16/20 1400       Date of Initial Exercise RX and Referring Provider   Date 11/16/20    Referring Provider Dr. Wyline Mood    Expected Discharge Date 02/11/21      Treadmill   MPH 1.7    Grade 0    Minutes 17      Recumbant Elliptical   Level 1    RPM 60    Minutes 22      Prescription Details   Frequency (times per week) 3    Duration Progress to 30 minutes of continuous aerobic without signs/symptoms of physical distress      Intensity   THRR 40-80% of Max Heartrate 60-119    Ratings of Perceived Exertion 11-13    Perceived Dyspnea 0-4      Resistance Training   Training Prescription Yes    Weight 3 lbs    Reps 10-15             Discharge Exercise Prescription (Final Exercise Prescription Changes):  Exercise Prescription Changes - 12/03/20 1126       Response to Exercise   Blood Pressure (Admit) 130/60    Blood Pressure (Exercise) 132/58    Blood Pressure (Exit) 116/60    Heart Rate (Admit) 94 bpm    Heart Rate (Exercise) 113 bpm    Heart Rate (Exit) 103 bpm    Rating of Perceived Exertion (Exercise) 13    Duration Continue with 30 min of aerobic exercise without signs/symptoms of physical distress.    Intensity THRR unchanged      Progression   Progression Continue to progress workloads to maintain  intensity without signs/symptoms of physical distress.      Resistance Training   Training Prescription Yes    Weight 4 lbs    Reps 10-15    Time 10 Minutes      NuStep   Level 2    SPM 96    Minutes 22    METs 2.62      Recumbant Elliptical   Level 2    RPM 59    Minutes 17    METs 3             Functional Capacity:  6 Minute Walk     Row Name 11/16/20 1427         6 Minute Walk   Phase Initial     Distance 1275 feet     Walk Time 6 minutes     # of Rest Breaks 0     MPH 2.41     METS 2.53     RPE 13     VO2 Peak 8.86     Symptoms Yes (comment)     Comments left hip tightness     Resting HR 87  bpm     Resting BP 108/62     Resting Oxygen Saturation  97 %     Exercise Oxygen Saturation  during 6 min walk 97 %     Max Ex. HR 100 bpm     Max Ex. BP 134/70     2 Minute Post BP 114/62              Psychological, QOL, Others - Outcomes: PHQ 2/9: Depression screen [facility] 2/9 11/16/2020 12/21/2019  Decreased Interest 0 0  Down, Depressed, Hopeless 0 0  PHQ - 2 Score 0 0  Altered sleeping 1 -  Tired, decreased energy 1 -  Change in appetite 0 -  Feeling bad or failure about yourself  0 -  Trouble concentrating 0 -  Moving slowly or fidgety/restless 0 -  Suicidal thoughts 0 -  PHQ-9 Score 2 -  Difficult doing work/chores Somewhat difficult -    Quality of Life:  Quality of Life - 11/16/20 1432       Quality of Life   Select Quality of Life      Quality of Life Scores   Health/Function Pre 23.13 %    Socioeconomic Pre 25.93 %    Psych/Spiritual Pre 28.33 %    Family Pre 29.5 %    GLOBAL Pre 25.64 %             Personal Goals: Goals established at orientation with interventions provided to work toward goal.  Personal Goals and Risk Factors at Admission - 11/16/20 1314       Core Components/Risk Factors/Patient Goals on Admission    Weight Management Yes;Weight Loss    Intervention Weight Management: Develop a combined nutrition and  exercise program designed to reach desired caloric intake, while maintaining appropriate intake of nutrient and fiber, sodium and fats, and appropriate energy expenditure required for the weight goal.;Weight Management: Provide education and appropriate resources to help participant work on and attain dietary goals.;Weight Management/Obesity: Establish reasonable short term and long term weight goals.;Obesity: Provide education and appropriate resources to help participant work on and attain dietary goals.    Expected Outcomes Short Term: Continue to assess and modify interventions until short term weight is achieved;Long Term: Adherence to nutrition and physical activity/exercise program aimed toward attainment of established weight goal;Weight Maintenance: Understanding of the daily nutrition guidelines, which includes 25-35% calories from fat, 7% or less cal from saturated fats, less than 200mg  cholesterol, less than 1.5gm of sodium, & 5 or more servings of fruits and vegetables daily;Weight Loss: Understanding of general recommendations for a balanced deficit meal plan, which promotes 1-2 lb weight loss per week and includes a negative energy balance of [phone] kcal/d;Understanding recommendations for meals to include 15-35% energy as protein, 25-35% energy from fat, 35-60% energy from carbohydrates, less than 200mg  of dietary cholesterol, 20-35 gm of total fiber daily;Understanding of distribution of calorie intake throughout the day with the consumption of 4-5 meals/snacks    Improve shortness of breath with ADL's Yes    Intervention Provide education, individualized exercise plan and daily activity instruction to help decrease symptoms of SOB with activities of daily living.    Expected Outcomes Short Term: Improve cardiorespiratory fitness to achieve a reduction of symptoms when performing ADLs;Long Term: Be able to perform more ADLs without symptoms or delay the onset of symptoms    Diabetes Yes     Intervention Provide education about signs/symptoms and action to take for hypo/hyperglycemia.;Provide education about proper nutrition, including  hydration, and aerobic/resistive exercise prescription along with prescribed medications to achieve blood glucose in normal ranges: Fasting glucose 65-99 mg/dL    Expected Outcomes Long Term: Attainment of HbA1C < 7%.;Short Term: Participant verbalizes understanding of the signs/symptoms and immediate care of hyper/hypoglycemia, proper foot care and importance of medication, aerobic/resistive exercise and nutrition plan for blood glucose control.    Hypertension Yes    Intervention Provide education on lifestyle modifcations including regular physical activity/exercise, weight management, moderate sodium restriction and increased consumption of fresh fruit, vegetables, and low fat dairy, alcohol moderation, and smoking cessation.;Monitor prescription use compliance.    Expected Outcomes Short Term: Continued assessment and intervention until BP is < 140/490mm HG in hypertensive participants. < 130/6680mm HG in hypertensive participants with diabetes, heart failure or chronic kidney disease.;Long Term: Maintenance of blood pressure at goal levels.              Personal Goals Discharge:  Goals and Risk Factor Review     Row Name 11/19/20 1417 12/17/20 1302           Core Components/Risk Factors/Patient Goals Review   Personal Goals Review Improve shortness of breath with ADL's;Weight Management/Obesity;Diabetes Improve shortness of breath with ADL's;Weight Management/Obesity;Diabetes      Review Patient referred to CR with NSTEMI/CABGx2. He has not started the program. He has multiple risk factors for CAD and is doing the program for risk modification. His personal goals for the program are to lose weight; decrease SOB and get back to hsi active lifestyle. We will monitor his progress as he works towards meeting these goals. Patient has completed 4  sessions maintaining his weight since his initial visit. He has missed 2 weeks without calling. His last communication was regarding missing his first session due to placing his mother in an assisted living facility out of town. We are not sure if this is the reason for his absences. His personal goals for the program are to lose weight; stop dizziness/SOB and be able to return to doing yard work and his flower garden. We will continue to monitor.      Expected Outcomes Patient will complete the program meeting both personal and program goals. Patient will complete the program meeting both personal and program goals.               Exercise Goals and Review:  Exercise Goals     Row Name 11/16/20 1431 11/26/20 1257           Exercise Goals   Increase Physical Activity Yes Yes      Intervention Provide advice, education, support and counseling about physical activity/exercise needs.;Develop an individualized exercise prescription for aerobic and resistive training based on initial evaluation findings, risk stratification, comorbidities and participant's personal goals. Provide advice, education, support and counseling about physical activity/exercise needs.;Develop an individualized exercise prescription for aerobic and resistive training based on initial evaluation findings, risk stratification, comorbidities and participant's personal goals.      Expected Outcomes Short Term: Attend rehab on a regular basis to increase amount of physical activity.;Long Term: Exercising regularly at least 3-5 days a week.;Long Term: Add in home exercise to make exercise part of routine and to increase amount of physical activity. Short Term: Attend rehab on a regular basis to increase amount of physical activity.;Long Term: Exercising regularly at least 3-5 days a week.;Long Term: Add in home exercise to make exercise part of routine and to increase amount of physical activity.      Increase  Strength and Stamina  Yes Yes      Intervention Provide advice, education, support and counseling about physical activity/exercise needs.;Develop an individualized exercise prescription for aerobic and resistive training based on initial evaluation findings, risk stratification, comorbidities and participant's personal goals. Provide advice, education, support and counseling about physical activity/exercise needs.;Develop an individualized exercise prescription for aerobic and resistive training based on initial evaluation findings, risk stratification, comorbidities and participant's personal goals.      Expected Outcomes Short Term: Increase workloads from initial exercise prescription for resistance, speed, and METs.;Short Term: Perform resistance training exercises routinely during rehab and add in resistance training at home;Long Term: Improve cardiorespiratory fitness, muscular endurance and strength as measured by increased METs and functional capacity ( ) Short Term: Increase workloads from initial exercise prescription for resistance, speed, and METs.;Short Term: Perform resistance training exercises routinely during rehab and add in resistance training at home;Long Term: Improve cardiorespiratory fitness, muscular endurance and strength as measured by increased METs and functional capacity ( )      Able to understand and use rate of perceived exertion (RPE) scale Yes Yes      Intervention Provide education and explanation on how to use RPE scale Provide education and explanation on how to use RPE scale      Expected Outcomes Short Term: Able to use RPE daily in rehab to express subjective intensity level;Long Term:  Able to use RPE to guide intensity level when exercising independently Short Term: Able to use RPE daily in rehab to express subjective intensity level;Long Term:  Able to use RPE to guide intensity level when exercising independently      Knowledge and understanding of Target Heart Rate Range (THRR) Yes  Yes      Intervention Provide education and explanation of THRR including how the numbers were predicted and where they are located for reference Provide education and explanation of THRR including how the numbers were predicted and where they are located for reference      Expected Outcomes Short Term: Able to state/look up THRR;Long Term: Able to use THRR to govern intensity when exercising independently;Short Term: Able to use daily as guideline for intensity in rehab Short Term: Able to state/look up THRR;Long Term: Able to use THRR to govern intensity when exercising independently;Short Term: Able to use daily as guideline for intensity in rehab      Able to check pulse independently Yes Yes      Intervention Provide education and demonstration on how to check pulse in carotid and radial arteries.;Review the importance of being able to check your own pulse for safety during independent exercise Provide education and demonstration on how to check pulse in carotid and radial arteries.;Review the importance of being able to check your own pulse for safety during independent exercise      Expected Outcomes Short Term: Able to explain why pulse checking is important during independent exercise Short Term: Able to explain why pulse checking is important during independent exercise      Understanding of Exercise Prescription Yes Yes      Intervention Provide education, explanation, and written materials on patient's individual exercise prescription Provide education, explanation, and written materials on patient's individual exercise prescription      Expected Outcomes Short Term: Able to explain program exercise prescription;Long Term: Able to explain home exercise prescription to exercise independently Short Term: Able to explain program exercise prescription;Long Term: Able to explain home exercise prescription to exercise independently  Exercise Goals Re-Evaluation:  Exercise Goals  Re-Evaluation     Row Name 11/26/20 1257             Exercise Goal Re-Evaluation   Exercise Goals Review Increase Physical Activity;Increase Strength and Stamina;Able to understand and use rate of perceived exertion (RPE) scale;Knowledge and understanding of Target Heart Rate Range (THRR);Able to check pulse independently;Understanding of Exercise Prescription       Comments Pt has attended 3 sessions of cardiac rehab. He has been unable to do the treadmill as planned due to gout effecting one of his feet. He has a positive outlook and will likely progress well through the program. He is currently exercising at 2.2 METs on the elliptical. Will continue to monitor and progress as able.       Expected Outcomes Through exercise at rehab and at home, the patient will meet their stated goals.                Nutrition & Weight - Outcomes:  Pre Biometrics - 11/16/20 1431       Pre Biometrics   Height 6\' 1"  (1.854 m)    Weight 251 lb 1.7 oz (113.9 kg)    Waist Circumference 45.5 inches    Hip Circumference 44 inches    Waist to Hip Ratio 1.03 %    BMI (Calculated) 33.14    Triceps Skinfold 13 mm    % Body Fat 31.1 %    Grip Strength 43.6 kg    Flexibility 19 in    Single Leg Stand 17.58 seconds              Nutrition:  Nutrition Therapy & Goals - 11/19/20 1416       Personal Nutrition Goals   Comments Patient scored 44 on his diet assessment. We offer 2 educational sessions on heart healthy nutrition with handouts.      Intervention Plan   Intervention Nutrition handout(s) given to patient.             Nutrition Discharge:  Nutrition Assessments - 11/16/20 1305       MEDFICTS Scores   Pre Score 44             Education Questionnaire Score:  Knowledge Questionnaire Score - 11/16/20 1308       Knowledge Questionnaire Score   Pre Score 23/24             Pt discharged from cardiac rehab after 4 visits. He seemed excited to get into the program  at orientation, but his attendance was inconsistent with frequent no call no shows. Several times he asked to come to a later class and then he would not show up. Pt last attended rehab on 12/03/2020 and has not called out during any of the subsequent absences.

## 2020-12-27 ENCOUNTER — Other Ambulatory Visit: Payer: Self-pay | Admitting: Cardiology

## 2020-12-28 ENCOUNTER — Encounter (HOSPITAL_COMMUNITY): Payer: Medicare Other

## 2020-12-29 DIAGNOSIS — E1165 Type 2 diabetes mellitus with hyperglycemia: Secondary | ICD-10-CM | POA: Diagnosis not present

## 2020-12-31 ENCOUNTER — Encounter (HOSPITAL_COMMUNITY): Payer: Medicare Other

## 2021-01-01 ENCOUNTER — Other Ambulatory Visit: Payer: Self-pay | Admitting: *Deleted

## 2021-01-01 NOTE — Patient Outreach (Signed)
[facility]) Care Management  [place] Care Manager  01/01/2021   Russell Morrow 11/16/1950 [id number]  Subjective: Telephone outreach and case closure.  Encounter Medications:  Outpatient Encounter Medications as of 01/01/2021  Medication Sig Note   acetaminophen (TYLENOL) 500 MG tablet Take 1,000 mg by mouth every 6 (six) hours as needed for moderate pain.     amLODipine (NORVASC) 10 MG tablet TAKE ONE TABLET BY MOUTH ONCE DAILY    aspirin EC 81 MG EC tablet Take 1 tablet (81 mg total) by mouth daily.    BD PEN NEEDLE NANO U/F 32G X 4 MM MISC Inject into the skin.    diclofenac Sodium (VOLTAREN) 1 % GEL Apply 2 g topically 4 (four) times daily as needed (pain).    dicyclomine (BENTYL) 10 MG capsule Take 10 mg by mouth at bedtime as needed for spasms.    esomeprazole (NEXIUM) 20 MG capsule Take 20 mg by mouth daily as needed (acid reflux).    ezetimibe (ZETIA) 10 MG tablet Take 10 mg by mouth daily.    FEROSUL 325 (65 Fe) MG tablet Take 325 mg by mouth 2 (two) times daily.     furosemide (LASIX) 40 MG tablet Take 1 tablet (40 mg total) by mouth daily. 11/16/2020: Patient not taking currently due to low BP- no edema issues. He is to address with MD   HUMALOG KWIKPEN 100 UNIT/ML KwikPen Inject 10-16 Units into the skin with breakfast, with lunch, and with evening meal. Sliding Scale:  CBG 140-180 = 10 units, 181-220 = 12 units, 221-260 = 14 units, 261-300 = 16 units , call MD over 300    LANTUS SOLOSTAR 100 UNIT/ML Solostar Pen Inject 35 Units into the skin 2 (two) times daily. (Patient taking differently: Inject 40 Units into the skin 2 (two) times daily.)    metoprolol succinate (TOPROL-XL) 50 MG 24 hr tablet Take 1 tablet (50 mg total) by mouth daily. Take with or immediately following a meal.    olmesartan (BENICAR) 40 MG tablet TAKE ONE TABLET BY MOUTH ONCE DAILY    potassium chloride SA (KLOR-CON) 20 MEQ tablet Take 1 tablet (20 mEq total) by mouth 2 (two) times daily.     pregabalin (LYRICA) 100 MG capsule Take 100 mg by mouth at bedtime.    rOPINIRole (REQUIP) 1 MG tablet Take 3 mg by mouth at bedtime. 10/16/2020: Taking 3 tabs   simethicone (MYLICON) 125 MG chewable tablet Chew 125 mg by mouth every 6 (six) hours as needed for flatulence.    spironolactone (ALDACTONE) 25 MG tablet TAKE ONE TABLET BY MOUTH ONCE DAILY    tadalafil (CIALIS) 10 MG tablet Take 10 mg by mouth daily as needed for erectile dysfunction.     temazepam (RESTORIL) 15 MG capsule Take 15 mg by mouth at bedtime.    TRULICITY 1.5 MG/0.5ML SOPN Inject 1.5 mg into the skin once a week.    No facility-administered encounter medications on file as of 01/01/2021.    Functional Status:  In your present state of health, do you have any difficulty performing the following activities: 10/09/2020 01/31/2020  Hearing? N -  Vision? N -  Difficulty concentrating or making decisions? N -  Walking or climbing stairs? N -  Dressing or bathing? N -  Doing errands, shopping? N N  Some recent data might be hidden    Fall/Depression Screening: Fall Risk  11/16/2020 01/24/2020 12/21/2019  Falls in the past year? 1 1 1   Number  falls in past yr: 1 1 1   Injury with Fall? 1 - 1  Risk for fall due to : History of fall(s);Medication side effect - History of fall(s)  Risk for fall due to: Comment - - Electrolyte imbalance.  Follow up Falls evaluation completed;Education provided;Falls prevention discussed - Falls evaluation completed   PHQ 2/9 Scores 11/16/2020 12/21/2019  PHQ - 2 Score 0 0  PHQ- 9 Score 2 -    Assessment: CAD stable                         DM improved self management (HgbA1C 6.6)  Care Plan   Goals Addressed             This Visit's Progress    COMPLETED: Improve My Heart Health-Coronary Artery Disease over the next 90 days by attending cardiopulmoniary rehab.       Timeframe:  Long-Range Goal Priority:  High Start Date:    10/16/20                         Expected End Date:       01/21/21                 Follow Up Date 12/2020  - be open to making changes - I can manage, know and watch for signs of a heart attack    Why is this important?   Lifestyle changes are key to improving the blood flow to your heart. Think about the things you can change and set a goal to live healthy.  Remember, when the blood vessels to your heart start to get clogged you may not have any symptoms.  Over time, they can get worse.  Don't ignore the signs, like chest pain, and get help right away.     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 Retired 12/16/20 and very knowledgeable about heart health. Prior to this pt has made lifestyle changes in his diet, glucose control and wt loss. Encouraged today to follow his discharge plan. Call MD if needed. Talk with NP weekly over next 4 weeks. Praised for his past accomplishments. Will continue working on continued lifestyle management for CAD and DM. 11/07/20 Pt reports he just tested + for COVID. He has had some chest discomfort, cough, fever. His wife is also +. He does not feel like this is setting him back at this time. He was to see the cardiologist today but it is now rescheduled for the 13th. He may be starting cardiac rehab after that visit. He admits he is just doing OK with trying to follow a heart healthy diet. He is moving around as much as he can tolerate. 11/14/20 Reports not feeling any better as far as his COVID sxs go. He is eating pretty much what he wants and not doing much moving around in general. Encouraged healthy choices and to move as much as he can. 11/26/20 Started Cardio-pulmonary rehab and is doing well. Reports his glucose today was 122 FBS and 145 at lunch. His Hgb A1C when he was in the hospital was 6.7. His current wt is 250#. 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 He is feeling pretty proud of himself and we celebrated his accomplishments and lifestyle management improvements 12/31/20 Russell Morrow is doing very well. He went to cardiac rehab and  then decided he could do at home what he was going to therapy for. She  saw his primary care MD and had a good check up. His HgbA1C was 6.6!!!We celebrated!!! Encouraged him to continue following a healthy diet and exercising.         Plan:  Follow-up: Patient requests no follow-up at this time. We agreed that Russell Morrow knows how to care for himself and he is at a good place right now. Taking much better care of himself.and we will close his case at this time. He knows he can call me at any time in the future.  [name]. [name], MSN, [facility] Gerontological Nurse Practitioner [facility] Care Management [phone]

## 2021-01-02 ENCOUNTER — Encounter (HOSPITAL_COMMUNITY): Payer: Medicare Other

## 2021-01-04 ENCOUNTER — Encounter (HOSPITAL_COMMUNITY): Payer: Medicare Other

## 2021-01-07 ENCOUNTER — Encounter (HOSPITAL_COMMUNITY): Payer: Medicare Other

## 2021-01-07 DEATH — deceased

## 2021-01-09 ENCOUNTER — Encounter (HOSPITAL_COMMUNITY): Payer: Medicare Other

## 2021-01-11 ENCOUNTER — Encounter (HOSPITAL_COMMUNITY): Payer: Medicare Other

## 2021-01-14 ENCOUNTER — Encounter (HOSPITAL_COMMUNITY): Payer: Medicare Other

## 2021-01-16 ENCOUNTER — Encounter (HOSPITAL_COMMUNITY): Payer: Medicare Other

## 2021-01-18 ENCOUNTER — Encounter (HOSPITAL_COMMUNITY): Payer: Medicare Other

## 2021-01-21 ENCOUNTER — Encounter (HOSPITAL_COMMUNITY): Payer: Medicare Other

## 2021-01-23 ENCOUNTER — Encounter (HOSPITAL_COMMUNITY): Payer: Medicare Other

## 2021-01-25 ENCOUNTER — Encounter (HOSPITAL_COMMUNITY): Payer: Medicare Other

## 2021-01-28 ENCOUNTER — Encounter (HOSPITAL_COMMUNITY): Payer: Medicare Other

## 2021-01-30 ENCOUNTER — Encounter (HOSPITAL_COMMUNITY): Payer: Medicare Other

## 2021-02-01 ENCOUNTER — Encounter (HOSPITAL_COMMUNITY): Payer: Medicare Other

## 2021-02-04 ENCOUNTER — Encounter (HOSPITAL_COMMUNITY): Payer: Medicare Other

## 2021-02-06 ENCOUNTER — Encounter (HOSPITAL_COMMUNITY): Payer: Medicare Other

## 2021-02-08 ENCOUNTER — Encounter (HOSPITAL_COMMUNITY): Payer: Medicare Other

## 2021-02-11 ENCOUNTER — Encounter (HOSPITAL_COMMUNITY): Payer: Medicare Other

## 2021-02-19 ENCOUNTER — Ambulatory Visit: Payer: Medicare Other | Admitting: Family Medicine

## 2022-03-24 IMAGING — CT CT HEAD W/O CM
3 series · 16 of 47 positions shown, 19 images · non-contrast
Comparison: None.

CLINICAL DATA: Seizure

EXAM:
CT HEAD WITHOUT CONTRAST
TECHNIQUE: Contiguous axial images were obtained from the base of the skull
through the vertex without intravenous contrast.

[Series 2: head w o · axial · 0.44mm/px · z∈[+64,+204]mm · 10 of 34 slices shown, 13 images]
[im 3/34  brain]
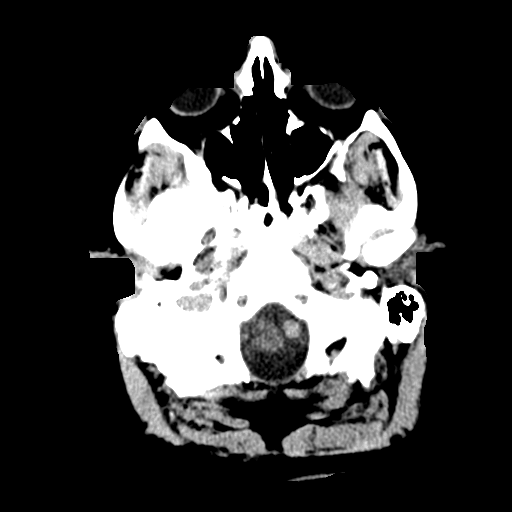
[im 3/34  bone]
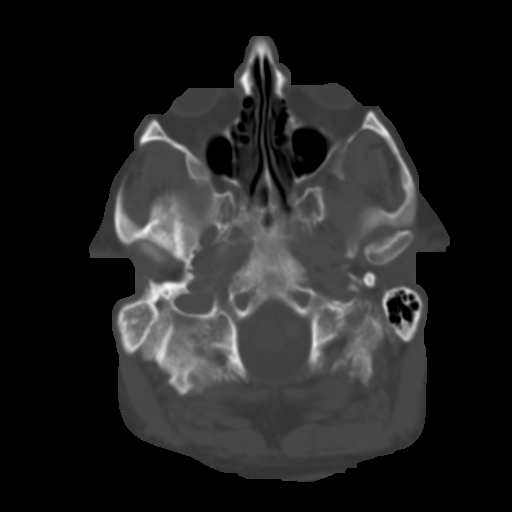
[im 6/34  brain]
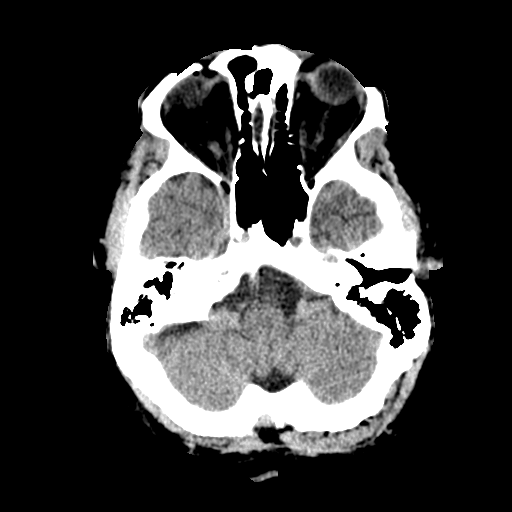
[im 10/34  brain]
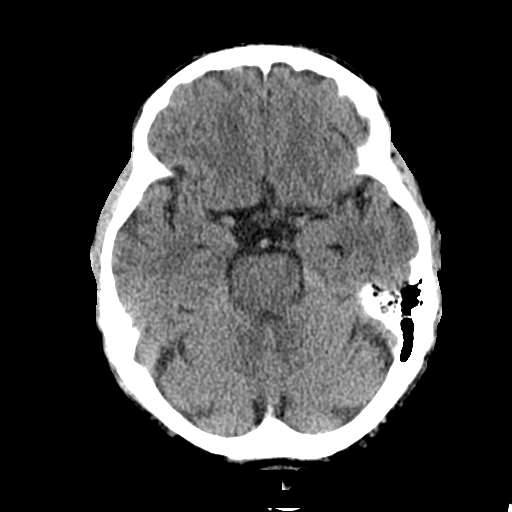
[im 12/34  brain]
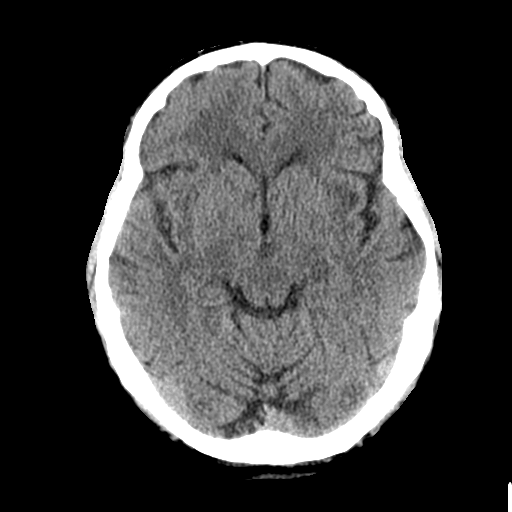
[im 15/34  brain]
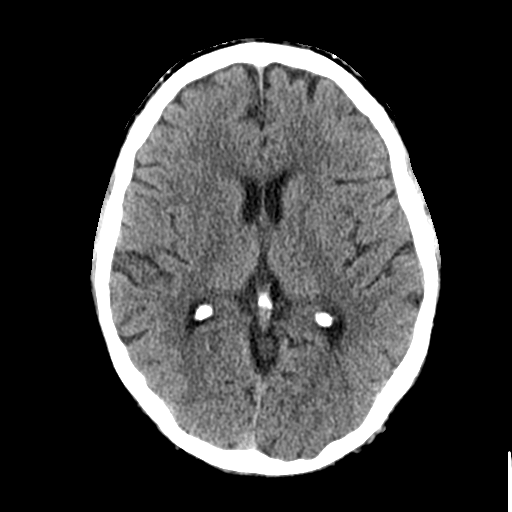
[im 15/34  bone]
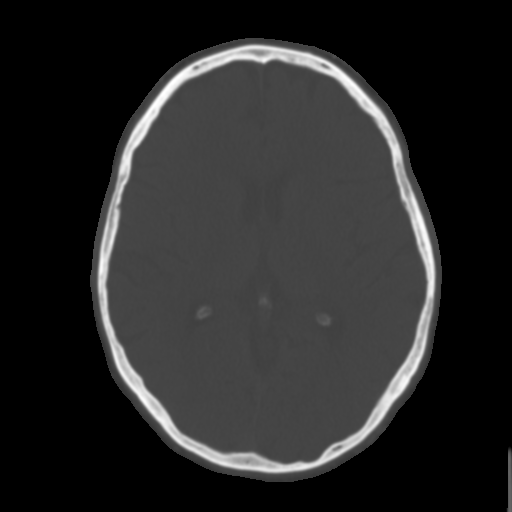
[im 19/34  brain]
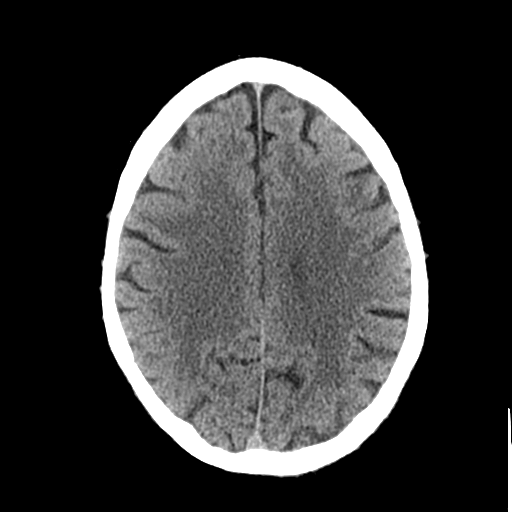
[im 22/34  brain]
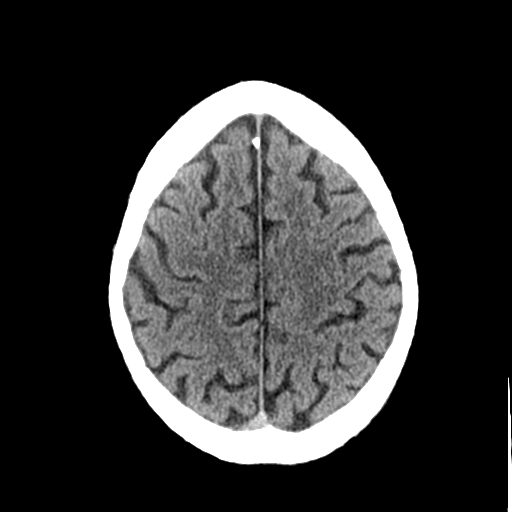
[im 26/34  brain]
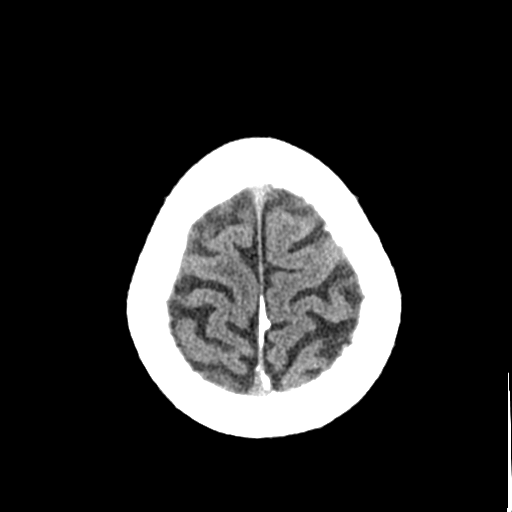
[im 28/34  brain]
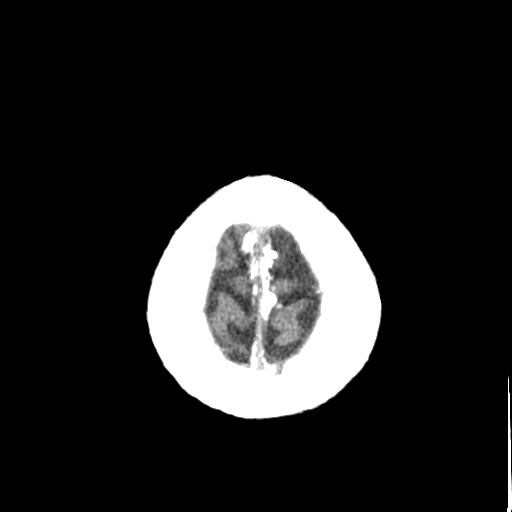
[im 28/34  bone]
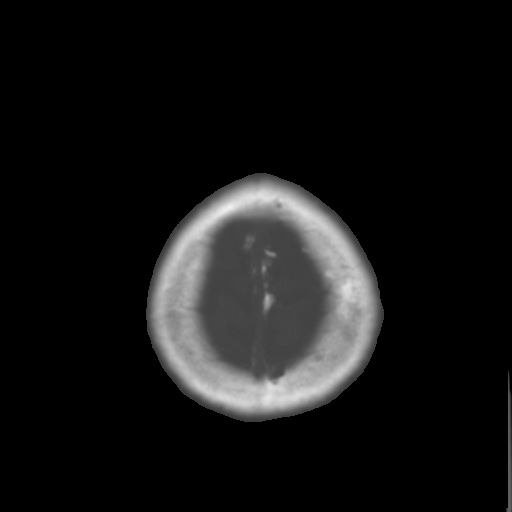
[im 31/34  brain]
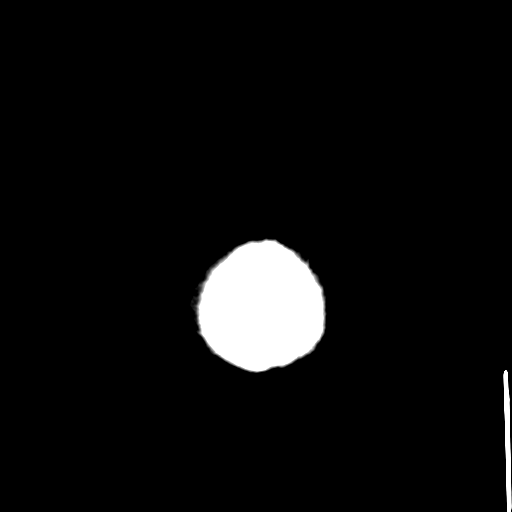

[Series 4: coronal soft · coronal · 0.33mm/px · 3 of 70 slices shown]
[im 24/70  brain]
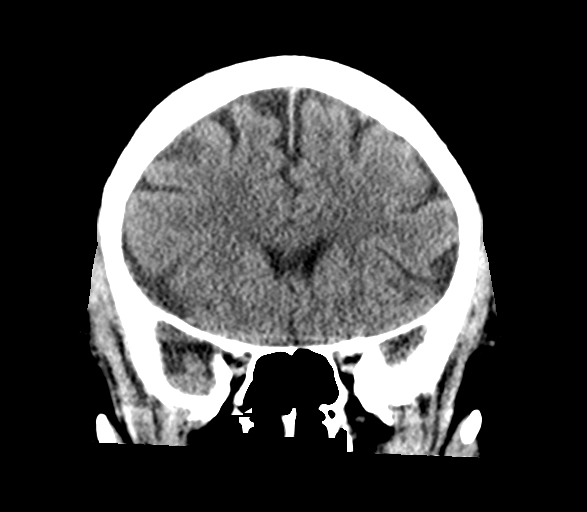
[im 31/70  brain]
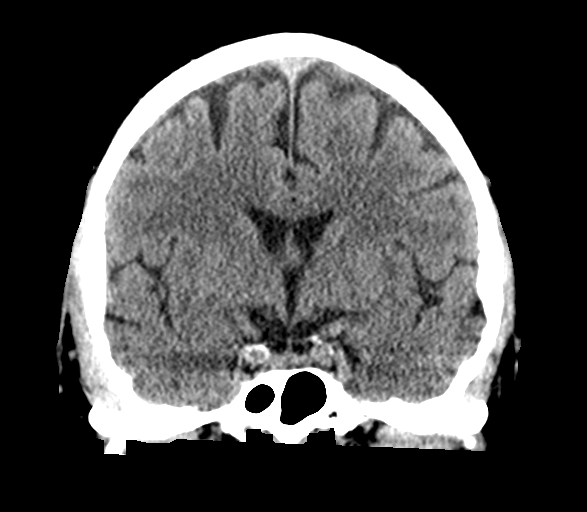
[im 39/70  brain]
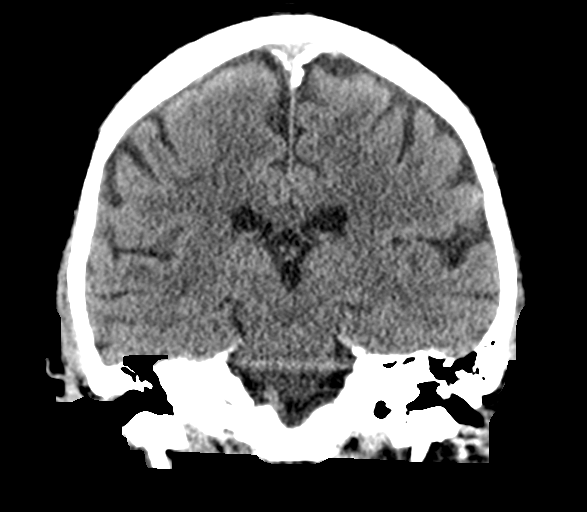

[Series 5: sagittal soft · sagittal · 0.33mm/px · 3 of 66 slices shown]
[im 22/66  brain]
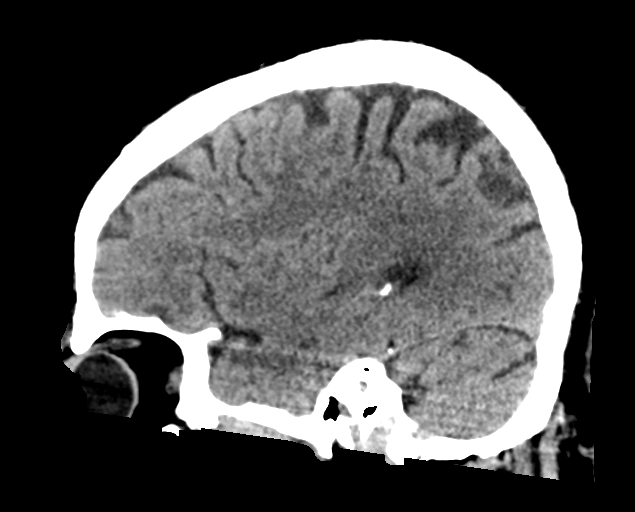
[im 33/66  brain]
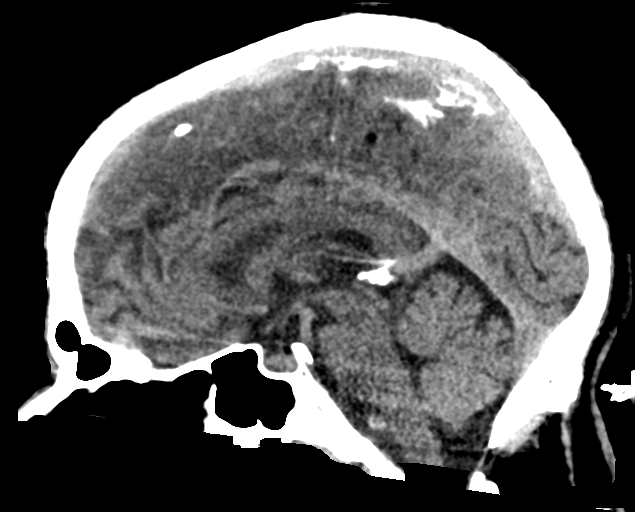
[im 44/66  brain]
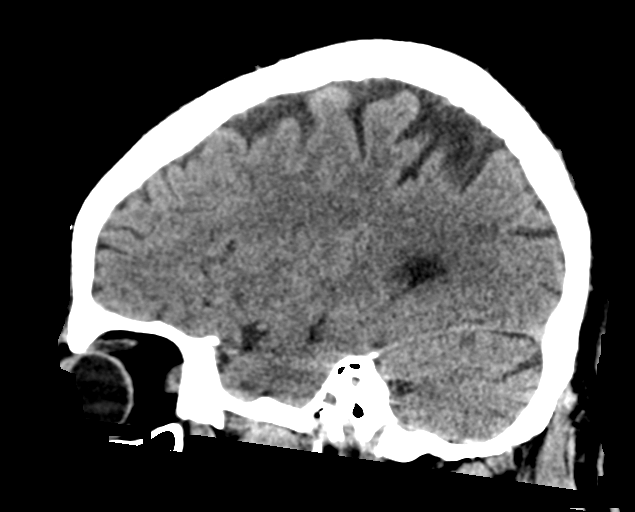

[16 of 47 positions shown; findings below may reference images not displayed]

FINDINGS: Brain: No acute intracranial abnormality. Specifically, no
hemorrhage, hydrocephalus, mass lesion, acute infarction, or
significant intracranial injury.

Vascular: No hyperdense vessel or unexpected calcification.

Skull: No acute calvarial abnormality.

Sinuses/Orbits: Visualized paranasal sinuses and mastoids clear.
Orbital soft tissues unremarkable.

Other: None
IMPRESSION: Normal study for patient's age.

## 2023-02-11 IMAGING — DX DG CHEST 2V
2 series · 2 of 2 positions shown · non-contrast
Comparison: 10/14/2020

CLINICAL DATA: Status post CABG

EXAM:
CHEST - 2 VIEW

[dg chest 2 view (1 of 2)]
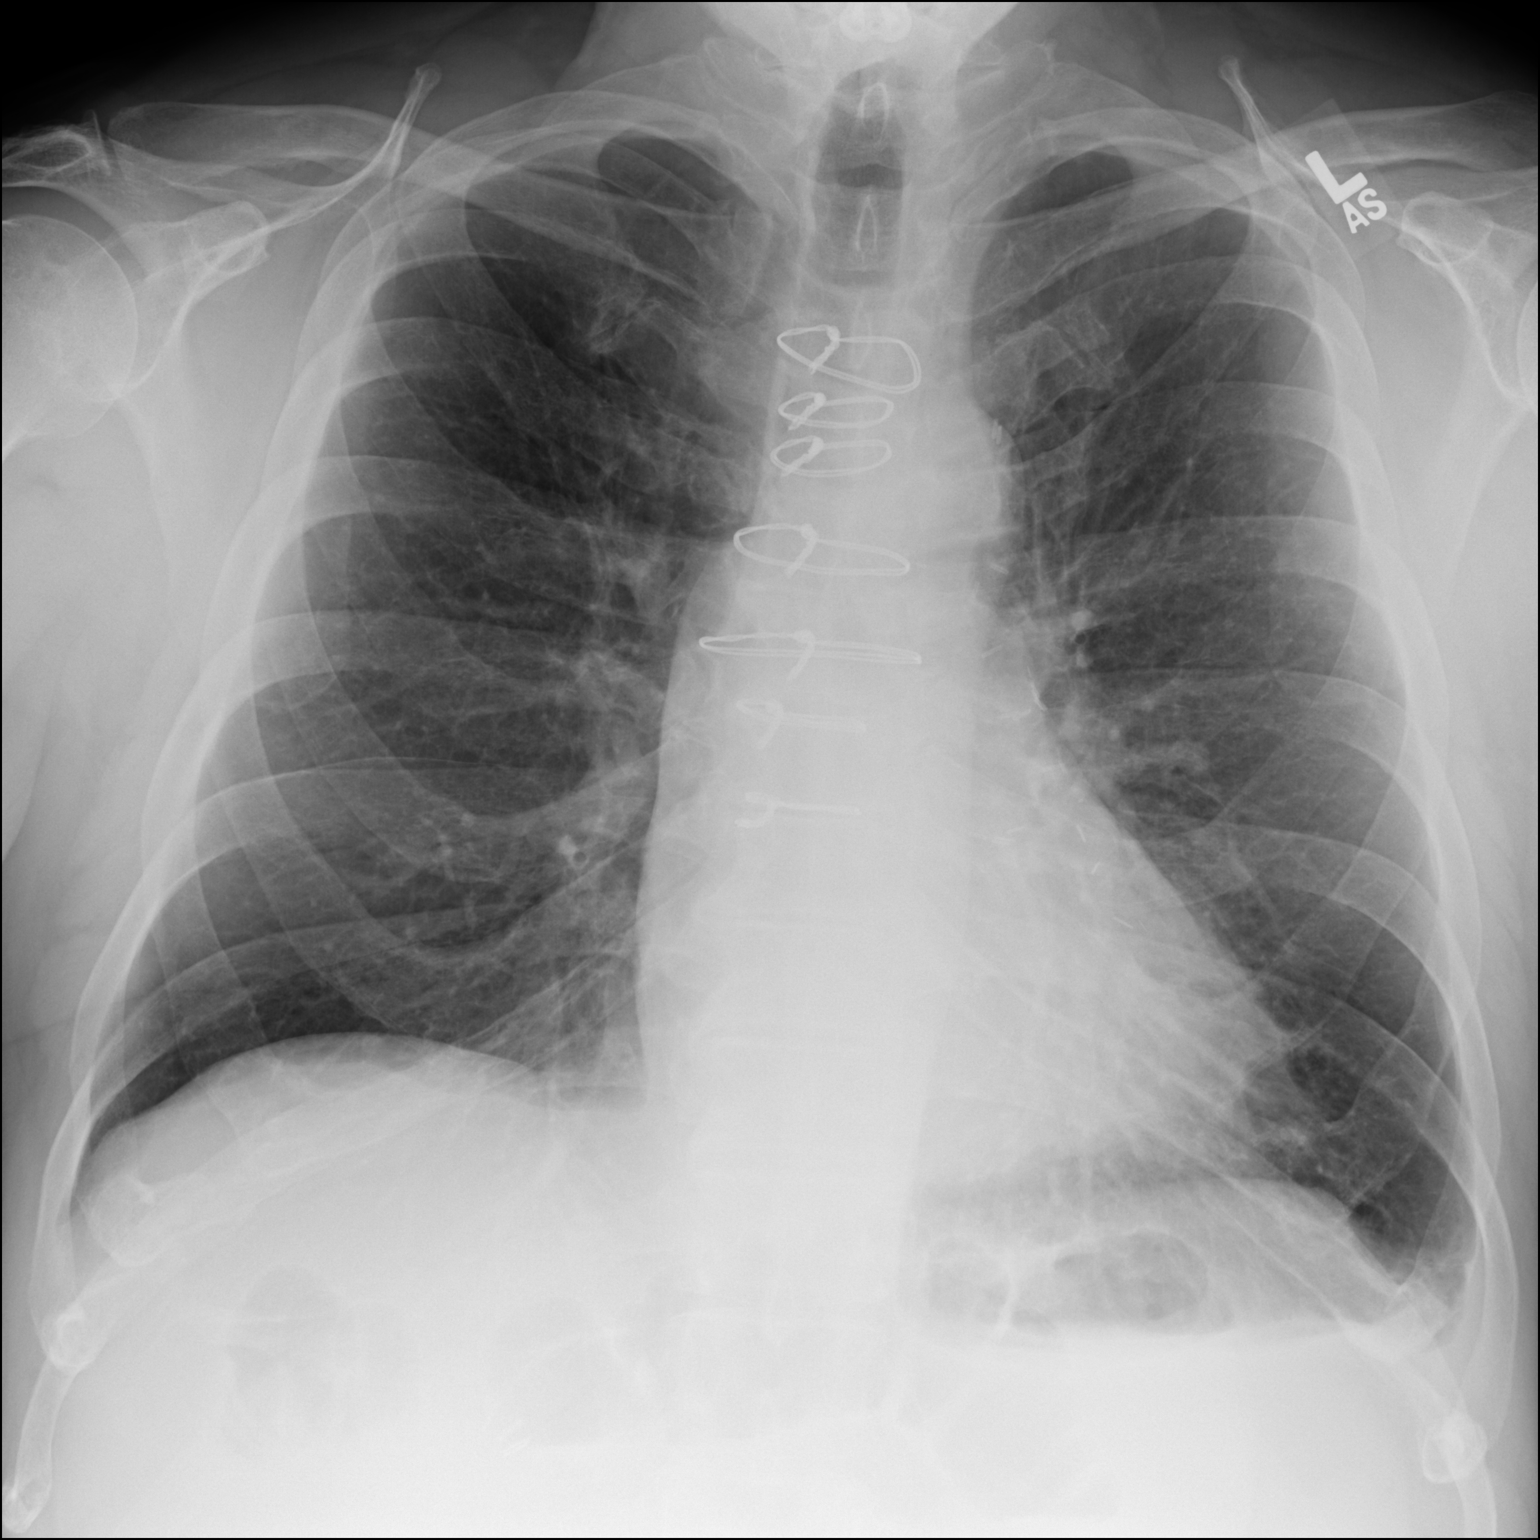

[dg chest 2 view (2 of 2)]
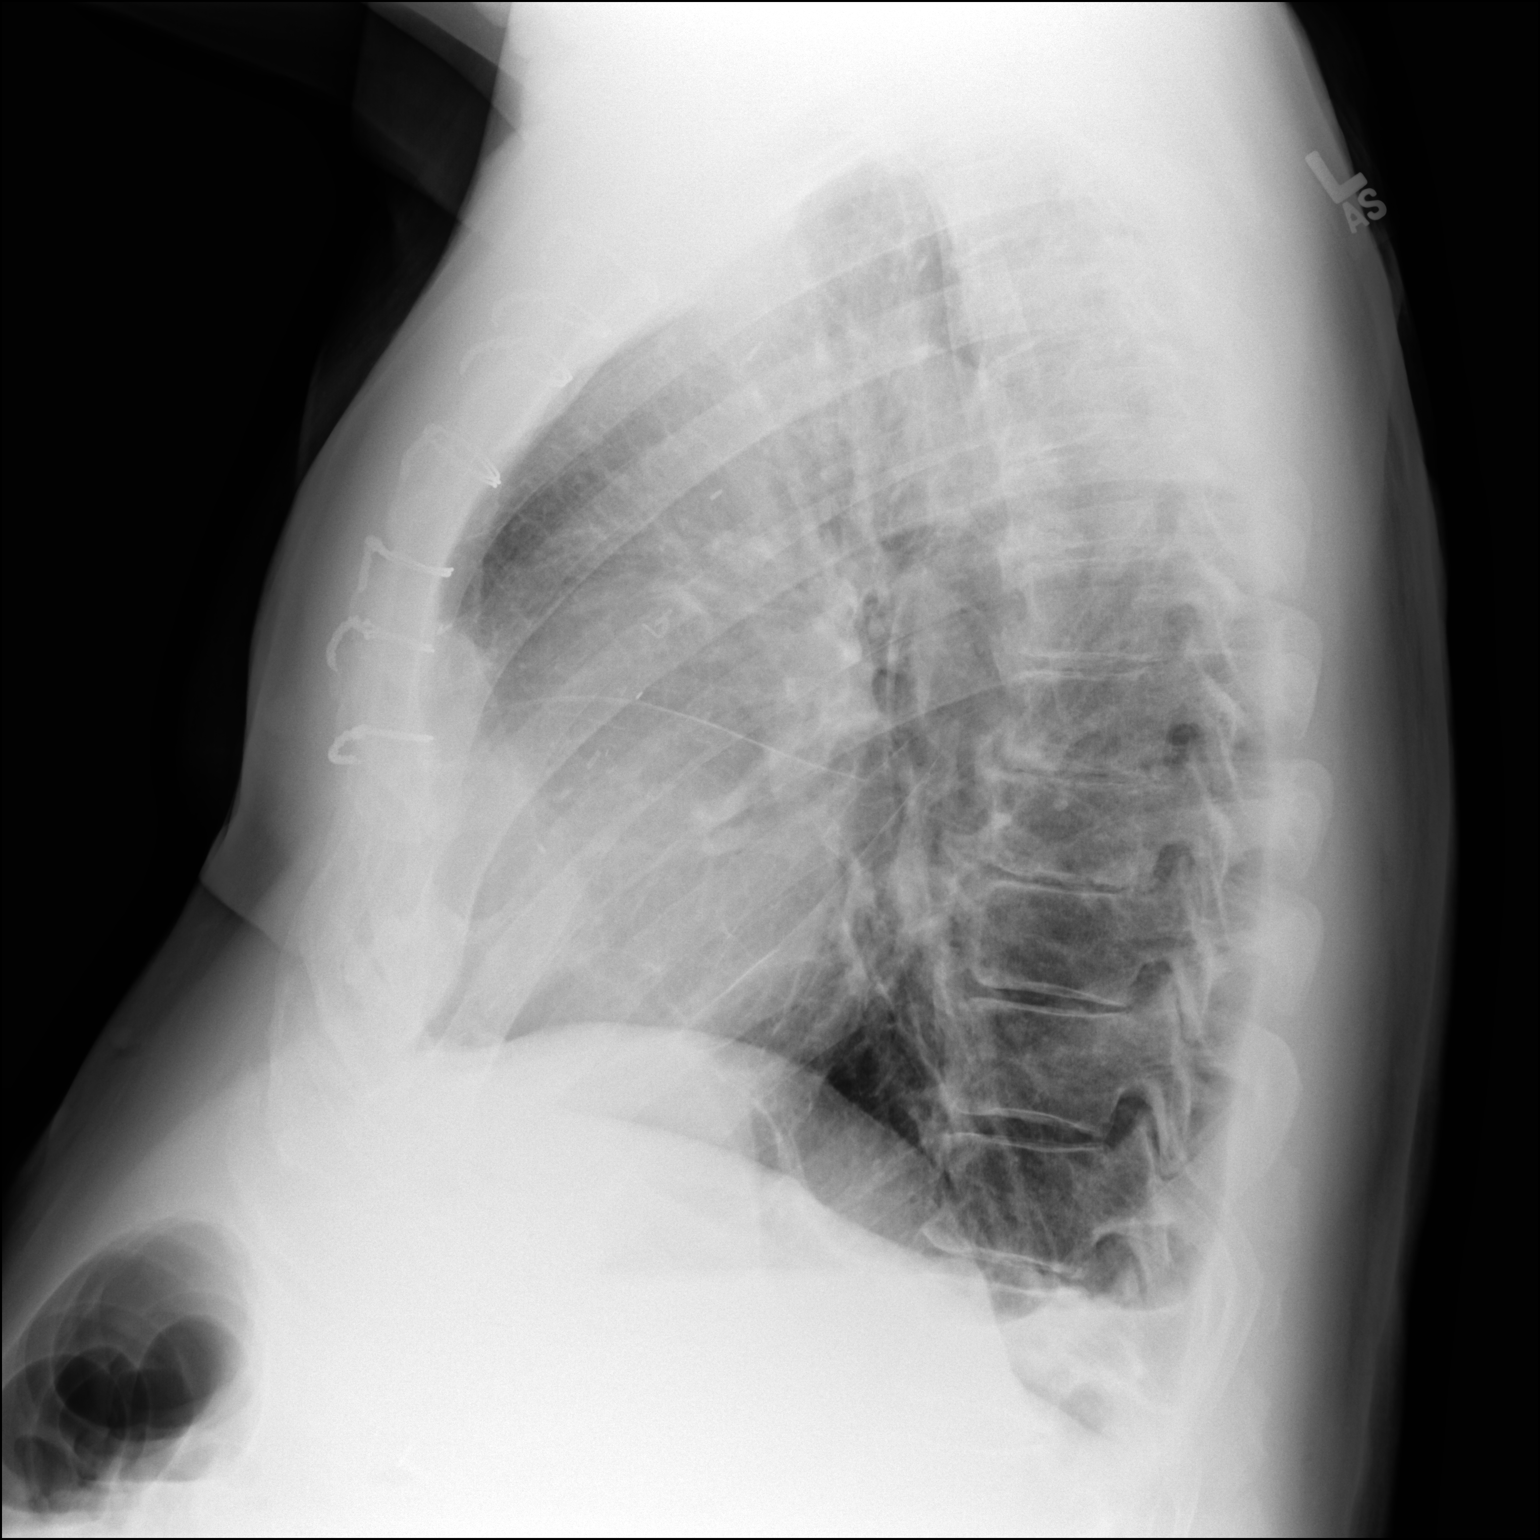

[2 of 2 positions shown; findings below may reference images not displayed]

FINDINGS: Postoperative change from median sternotomy and CABG procedure.
Heart size appears normal. Small left pleural effusion. No
pneumothorax. No interstitial edema or airspace disease. Visualized
osseous structures are unremarkable.
IMPRESSION: Small left pleural effusion.
